# Patient Record
Sex: Female | Born: 1935 | Race: White | Hispanic: No | Smoking: Never smoker
Health system: Southern US, Community
[De-identification: ages and names within clinical notes are randomized; demographics above are authoritative.]

## PROBLEM LIST (undated history)

## (undated) DIAGNOSIS — D128 Benign neoplasm of rectum: Secondary | ICD-10-CM

## (undated) DIAGNOSIS — E039 Hypothyroidism, unspecified: Secondary | ICD-10-CM

## (undated) DIAGNOSIS — K5790 Diverticulosis of intestine, part unspecified, without perforation or abscess without bleeding: Secondary | ICD-10-CM

## (undated) DIAGNOSIS — J40 Bronchitis, not specified as acute or chronic: Secondary | ICD-10-CM

## (undated) DIAGNOSIS — H409 Unspecified glaucoma: Secondary | ICD-10-CM

## (undated) DIAGNOSIS — N189 Chronic kidney disease, unspecified: Secondary | ICD-10-CM

## (undated) DIAGNOSIS — K648 Other hemorrhoids: Secondary | ICD-10-CM

## (undated) DIAGNOSIS — E785 Hyperlipidemia, unspecified: Secondary | ICD-10-CM

## (undated) DIAGNOSIS — K219 Gastro-esophageal reflux disease without esophagitis: Secondary | ICD-10-CM

## (undated) DIAGNOSIS — I10 Essential (primary) hypertension: Secondary | ICD-10-CM

## (undated) DIAGNOSIS — E119 Type 2 diabetes mellitus without complications: Secondary | ICD-10-CM

## (undated) HISTORY — PX: APPENDECTOMY: SHX54

## (undated) HISTORY — DX: Gastro-esophageal reflux disease without esophagitis: K21.9

## (undated) HISTORY — PX: HERNIA REPAIR: SHX51

## (undated) HISTORY — PX: COLONOSCOPY: SHX174

## (undated) HISTORY — DX: Hyperlipidemia, unspecified: E78.5

## (undated) HISTORY — DX: Diverticulosis of intestine, part unspecified, without perforation or abscess without bleeding: K57.90

## (undated) HISTORY — PX: TOTAL ABDOMINAL HYSTERECTOMY W/ BILATERAL SALPINGOOPHORECTOMY: SHX83

## (undated) HISTORY — PX: CHOLECYSTECTOMY: SHX55

## (undated) HISTORY — DX: Other hemorrhoids: K64.8

## (undated) HISTORY — DX: Essential (primary) hypertension: I10

## (undated) HISTORY — DX: Hypothyroidism, unspecified: E03.9

## (undated) HISTORY — DX: Type 2 diabetes mellitus without complications: E11.9

## (undated) HISTORY — DX: Bronchitis, not specified as acute or chronic: J40

## (undated) HISTORY — DX: Chronic kidney disease, unspecified: N18.9

## (undated) HISTORY — PX: TONSILLECTOMY: SUR1361

## (undated) HISTORY — DX: Unspecified glaucoma: H40.9

## (undated) HISTORY — PX: BREAST CYST EXCISION: SHX579

## (undated) HISTORY — DX: Benign neoplasm of rectum: D12.8

## (undated) HISTORY — PX: POLYPECTOMY: SHX149

---

## 1997-09-25 ENCOUNTER — Other Ambulatory Visit: Admission: RE | Admit: 1997-09-25 | Discharge: 1997-09-25 | Payer: Self-pay | Admitting: *Deleted

## 1999-04-08 ENCOUNTER — Other Ambulatory Visit: Admission: RE | Admit: 1999-04-08 | Discharge: 1999-04-08 | Payer: Self-pay | Admitting: *Deleted

## 2000-04-01 ENCOUNTER — Encounter: Admission: RE | Admit: 2000-04-01 | Discharge: 2000-04-01 | Payer: Self-pay | Admitting: *Deleted

## 2000-04-07 ENCOUNTER — Other Ambulatory Visit: Admission: RE | Admit: 2000-04-07 | Discharge: 2000-04-07 | Payer: Self-pay | Admitting: *Deleted

## 2000-06-22 ENCOUNTER — Encounter: Admission: RE | Admit: 2000-06-22 | Discharge: 2000-06-22 | Payer: Self-pay | Admitting: *Deleted

## 2000-12-19 ENCOUNTER — Encounter: Admission: RE | Admit: 2000-12-19 | Discharge: 2000-12-19 | Payer: Self-pay | Admitting: *Deleted

## 2001-06-21 ENCOUNTER — Encounter: Admission: RE | Admit: 2001-06-21 | Discharge: 2001-06-21 | Payer: Self-pay | Admitting: *Deleted

## 2002-02-24 DIAGNOSIS — D128 Benign neoplasm of rectum: Secondary | ICD-10-CM

## 2002-02-24 HISTORY — DX: Benign neoplasm of rectum: D12.8

## 2002-03-01 ENCOUNTER — Ambulatory Visit (HOSPITAL_COMMUNITY): Admission: RE | Admit: 2002-03-01 | Discharge: 2002-03-01 | Payer: Self-pay | Admitting: *Deleted

## 2002-03-01 ENCOUNTER — Encounter (INDEPENDENT_AMBULATORY_CARE_PROVIDER_SITE_OTHER): Payer: Self-pay | Admitting: Specialist

## 2002-04-26 HISTORY — PX: OTHER SURGICAL HISTORY: SHX169

## 2002-09-27 ENCOUNTER — Encounter: Payer: Self-pay | Admitting: Family Medicine

## 2002-09-27 ENCOUNTER — Ambulatory Visit (HOSPITAL_COMMUNITY): Admission: RE | Admit: 2002-09-27 | Discharge: 2002-09-27 | Payer: Self-pay | Admitting: Family Medicine

## 2002-11-19 ENCOUNTER — Encounter: Admission: RE | Admit: 2002-11-19 | Discharge: 2002-11-19 | Payer: Self-pay | Admitting: Geriatric Medicine

## 2004-01-08 ENCOUNTER — Encounter: Admission: RE | Admit: 2004-01-08 | Discharge: 2004-01-08 | Payer: Self-pay | Admitting: Internal Medicine

## 2004-01-13 ENCOUNTER — Encounter: Admission: RE | Admit: 2004-01-13 | Discharge: 2004-01-13 | Payer: Self-pay | Admitting: Internal Medicine

## 2004-01-14 ENCOUNTER — Encounter: Admission: RE | Admit: 2004-01-14 | Discharge: 2004-01-14 | Payer: Self-pay | Admitting: Internal Medicine

## 2004-01-24 ENCOUNTER — Encounter: Admission: RE | Admit: 2004-01-24 | Discharge: 2004-01-24 | Payer: Self-pay | Admitting: Internal Medicine

## 2004-03-06 ENCOUNTER — Other Ambulatory Visit: Admission: RE | Admit: 2004-03-06 | Discharge: 2004-03-06 | Payer: Self-pay | Admitting: Gynecology

## 2004-03-24 ENCOUNTER — Encounter (INDEPENDENT_AMBULATORY_CARE_PROVIDER_SITE_OTHER): Payer: Self-pay | Admitting: Specialist

## 2004-03-24 ENCOUNTER — Inpatient Hospital Stay (HOSPITAL_COMMUNITY): Admission: RE | Admit: 2004-03-24 | Discharge: 2004-03-26 | Payer: Self-pay | Admitting: Gynecology

## 2004-08-11 ENCOUNTER — Ambulatory Visit: Payer: Self-pay | Admitting: Internal Medicine

## 2005-03-17 ENCOUNTER — Encounter: Admission: RE | Admit: 2005-03-17 | Discharge: 2005-03-17 | Payer: Self-pay | Admitting: Gynecology

## 2005-06-02 ENCOUNTER — Ambulatory Visit (HOSPITAL_COMMUNITY): Admission: RE | Admit: 2005-06-02 | Discharge: 2005-06-02 | Payer: Self-pay | Admitting: *Deleted

## 2005-08-10 ENCOUNTER — Ambulatory Visit: Payer: Self-pay | Admitting: Internal Medicine

## 2006-08-10 ENCOUNTER — Ambulatory Visit: Payer: Self-pay | Admitting: Internal Medicine

## 2007-08-09 DIAGNOSIS — J40 Bronchitis, not specified as acute or chronic: Secondary | ICD-10-CM

## 2007-08-09 DIAGNOSIS — J302 Other seasonal allergic rhinitis: Secondary | ICD-10-CM

## 2007-08-09 DIAGNOSIS — J3089 Other allergic rhinitis: Secondary | ICD-10-CM

## 2007-08-10 ENCOUNTER — Ambulatory Visit: Payer: Self-pay | Admitting: Internal Medicine

## 2008-08-07 ENCOUNTER — Ambulatory Visit: Payer: Self-pay | Admitting: Internal Medicine

## 2008-08-07 DIAGNOSIS — I10 Essential (primary) hypertension: Secondary | ICD-10-CM | POA: Insufficient documentation

## 2008-08-07 DIAGNOSIS — E119 Type 2 diabetes mellitus without complications: Secondary | ICD-10-CM

## 2008-08-07 DIAGNOSIS — E039 Hypothyroidism, unspecified: Secondary | ICD-10-CM | POA: Insufficient documentation

## 2009-08-06 ENCOUNTER — Ambulatory Visit: Payer: Self-pay | Admitting: Internal Medicine

## 2010-02-03 ENCOUNTER — Ambulatory Visit: Payer: Self-pay | Admitting: Internal Medicine

## 2010-02-03 DIAGNOSIS — R0989 Other specified symptoms and signs involving the circulatory and respiratory systems: Secondary | ICD-10-CM | POA: Insufficient documentation

## 2010-02-03 DIAGNOSIS — R0609 Other forms of dyspnea: Secondary | ICD-10-CM

## 2010-05-28 NOTE — Assessment & Plan Note (Signed)
Summary: f/u 1 yr////kp   Primary Provider/Referring Provider:  Juline Patch  CC:  Follow up visit-sinus stopped up; drainage in throat; cough-productive at times and slight yellow in color at times. .  History of Present Illness: BRONCHITIS (ICD-490) ALLERGIC RHINITIS (ICD-477.9) 08/10/07- Jade [name] returns for follow-up of her allergic rhinitis and bronchitis.  Her chest feels quite comfortable.  Mild pollen  rhinitis is being managed with Claritin.  Aug 27, 2008- Bronchitis, allergic rhinitis Nasal congestion x 2 weeks blamed on pollen. Voice is cracking. Watery rhinorhea, some sneeze without fever or sore throat. Chest not congested, but feels something at larynx or trachea which clears with throat clearing of scant clear mucus. Discussed prior experience with steroid nasal sprays.  August 06, 2009- Bronchitis, allergic rhinitis Sneeze, cough from postnasal drip, nasal congestion. This varies with rain fall and how much she is outside. Cough but not wheeze. Clear phlegm.    Current Medications (verified): 1)  Synthroid 75 Mcg  Tabs (Levothyroxine Sodium) .... Take 1 Tablet By Mouth Once A Day 2)  Triamterene-Hctz 75-50 Mg  Tabs (Triamterene-Hctz) .... Take 1 Tablet By Mouth Once A Day 3)  Nexium 40 Mg  Pack (Esomeprazole Magnesium) .... Take 1 Capsule By Mouth Once A Day 4)  Eql Fish Oil 1000 Mg  Caps (Omega-3 Fatty Acids) .... Take 1 Capsule By Mouth Once A Day 5)  Multivitamins   Tabs (Multiple Vitamin) .... Take 1 Tablet By Mouth Once A Day 6)  Vivelle-Dot 0.075 Mg/24hr  Pttw (Estradiol) .... As Directed 7)  Metformin Hcl 500 Mg  Tabs (Metformin Hcl) .... Once Daily 8)  Nitrofurantoin Monohyd Macro 100 Mg  Caps (Nitrofurantoin Monohyd Macro) .... Once Daily 9)  Betmol Eye Drops .Marland KitchenMarland Kitchen. 1 Drop A Day As Directed 10)  Adult Aspirin Low Strength 81 Mg  Tbdp (Aspirin) .... Once Daily 11)  Lipitor 10 Mg Tabs (Atorvastatin Calcium) .... Take One Tablet Daily. 12)  Toviaz 4 Mg Xr24h-Tab  (Fesoterodine Fumarate) .... Take One Tablet Daily. 13)  Coq10 50 Mg Caps (Coenzyme Q10) .... Take 1 By Mouth Once Daily 14)  Vitamin D 1000 Unit Tabs (Cholecalciferol) .... Take 1 By Mouth Once Daily  Allergies: 1)  ! Codeine 2)  ! Ultram  Past History:  Past Medical History: Last updated: 08/27/08 BRONCHITIS (ICD-490) ALLERGIC RHINITIS (ICD-477.9) Diabetes, Type 2 Hypertension Hypothyroidism glaucoma  Past Surgical History: Last updated: 27-Aug-2008 Tonsillectomy breast cysts T A H and B S O Cholecystectomy Appendectomy diverticulitis hernia repair with mesh  Family History: Last updated: Aug 27, 2008 Father- died cancer Mother- died heart disease age 67  Social History: Last updated: 08-27-2008 Patient never smoked.  widowed  Risk Factors: Smoking Status: never (08/10/2007)  Review of Systems      See HPI  The patient denies anorexia, fever, weight loss, weight gain, vision loss, decreased hearing, hoarseness, chest pain, syncope, dyspnea on exertion, peripheral edema, prolonged cough, headaches, hemoptysis, abdominal pain, and severe indigestion/heartburn.    Vital Signs:  Patient profile:   77 year old female Height:      60 inches Weight:      115.38 pounds BMI:     22.62 O2 Sat:      96 % on Room air Pulse rate:   65 / minute BP sitting:   124 / 74  (left arm) Cuff size:   regular  Vitals Entered By: Reynaldo Minium CMA (August 06, 2009 11:27 AM)  O2 Flow:  Room air  Physical Exam  Additional Exam:  General:  A/Ox3; pleasant and cooperative, NAD, medium build SKIN: no rash, lesions NODES: no lymphadenopathy HEENT: [place]/AT, EOM- WNL, Conjuctivae- clear, PERRLA, TM-WNL, Nose- clear mucus bridging, Throat- clear and wnl, hoarse, no stridor NECK: Supple w/ fair ROM, JVD- none, normal carotid impulses w/o bruits Thyroid-  CHEST: Clear to P&A. Deep bresath triggered dry cough but no wheeze HEART: RRR, no m/g/r heard ABDOMEN:  ZHY:QMVH, nl pulses, no  edema  NEURO: Grossly intact to observation      Impression & Recommendations:  Problem # 1:  ALLERGIC RHINITIS (ICD-477.9)  We discussed available treatments. i suggested she start with otc antihistamine. Also gave sample Patanase nasal spray.  Orders: Est. Patient Level III (84696)  Problem # 2:  BRONCHITIS (ICD-490)  Minimal pollen related brochitis, not asthma and without bacterial infection.  Orders: Est. Patient Level III (29528)  Medications Added to Medication List This Visit: 1)  Betmol Eye Drops  .Marland Kitchen.. 1 drop a day as directed 2)  Coq10 50 Mg Caps (Coenzyme q10) .... Take 1 by mouth once daily 3)  Vitamin D 1000 Unit Tabs (Cholecalciferol) .... Take 1 by mouth once daily  Patient Instructions: 1)  Please schedule a follow-up appointment in 6 months. 2)  Try otc antihistamine for sneeze and drainage: 3)  Allegra/ fexofenadine or Claritin/ loratadine 4)  Sample Patanase nasal spray, 1-2 puffs each nostril twice daily if needed.

## 2010-05-28 NOTE — Assessment & Plan Note (Signed)
Summary: 6 months/apc   Primary Provider/Referring Provider:  Juline Patch  CC:  6 month follow up visit-stuffy nose x 1-2 weeks and SOB with climbing stairs..  History of Present Illness: 2008-08-08- Bronchitis, allergic rhinitis Nasal congestion x 2 weeks blamed on pollen. Voice is cracking. Watery rhinorhea, some sneeze without fever or sore throat. Chest not congested, but feels something at larynx or trachea which clears with throat clearing of scant clear mucus. Discussed prior experience with steroid nasal sprays.  August 06, 2009- Bronchitis, allergic rhinitis Sneeze, cough from postnasal drip, nasal congestion. This varies with rain fall and how much she is outside. Cough but not wheeze. Clear phlegm.  February 03, 2010- 2008/08/08- Bronchitis, allergic rhinitis cc 6 month follow up visit-stuffy nose x 1-2 weeks and SOB with climbing stairs. Had flu vax.  Gradually more DOE on stairs in past year or so. Denies cough or wheeze, chest pain or palpitation. Denies blood or glands swollen. C/O maxillary and nasal dry stuffiness for 2 weeks. Dances 3 days/ week and notes a little DOE with faster dances. Denies heart condition.     Preventive Screening-Counseling & Management  Alcohol-Tobacco     Smoking Status: never  Current Medications (verified): 1)  Synthroid 75 Mcg  Tabs (Levothyroxine Sodium) .... Take 1 Tablet By Mouth Once A Day 2)  Triamterene-Hctz 75-50 Mg  Tabs (Triamterene-Hctz) .... Take 1 Tablet By Mouth Once A Day 3)  Nexium 40 Mg  Pack (Esomeprazole Magnesium) .... Take 1 Capsule By Mouth Once A Day 4)  Eql Fish Oil 1000 Mg  Caps (Omega-3 Fatty Acids) .... Take 1 Capsule By Mouth Once A Day 5)  Multivitamins   Tabs (Multiple Vitamin) .... Take 1 Tablet By Mouth Once A Day 6)  Vivelle-Dot 0.075 Mg/24hr  Pttw (Estradiol) .... As Directed 7)  Metformin Hcl 500 Mg  Tabs (Metformin Hcl) .... Once Daily 8)  Nitrofurantoin Monohyd Macro 100 Mg  Caps (Nitrofurantoin Monohyd  Macro) .... Once Daily 9)  Betmol Eye Drops .Marland KitchenMarland Kitchen. 1 Drop A Day As Directed 10)  Adult Aspirin Low Strength 81 Mg  Tbdp (Aspirin) .... Once Daily 11)  Lipitor 10 Mg Tabs (Atorvastatin Calcium) .... Take One Tablet Daily. 12)  Toviaz 4 Mg Xr24h-Tab (Fesoterodine Fumarate) .... Take One Tablet Daily. 13)  Coq10 50 Mg Caps (Coenzyme Q10) .... Take 1 By Mouth Once Daily 14)  Vitamin D 1000 Unit Tabs (Cholecalciferol) .... Take 1 By Mouth Once Daily  Allergies (verified): 1)  ! Codeine 2)  ! Ultram 3)  ! Talwin  Past History:  Past Medical History: Last updated: 2008/08/08 BRONCHITIS (ICD-490) ALLERGIC RHINITIS (ICD-477.9) Diabetes, Type 2 Hypertension Hypothyroidism glaucoma  Past Surgical History: Last updated: 08-08-08 Tonsillectomy breast cysts T A [facility] and B S [facility] Cholecystectomy Appendectomy diverticulitis hernia repair with mesh  Family History: Last updated: 08-08-2008 Father- died cancer Mother- died heart disease age 22  Social History: Last updated: August 08, 2008 Patient never smoked.  widowed  Risk Factors: Smoking Status: never (02/03/2010)  Review of Systems      See HPI       The patient complains of shortness of breath with activity and nasal congestion/difficulty breathing through nose.  The patient denies shortness of breath at rest, productive cough, non-productive cough, coughing up blood, chest pain, irregular heartbeats, acid heartburn, indigestion, loss of appetite, weight change, abdominal pain, difficulty swallowing, sore throat, tooth/dental problems, and headaches.    Vital Signs:  Patient profile:   77 year old female Height:  60 inches Weight:      111.38 pounds BMI:     21.83 O2 Sat:      97 % on Room air Pulse rate:   70 / minute BP sitting:   122 / 70  (left arm) Cuff size:   regular  Vitals Entered By: Reynaldo Minium CMA (February 03, 2010 11:10 AM)  O2 Flow:  Room air CC: 6 month follow up visit-stuffy nose x 1-2 weeks and SOB  with climbing stairs.   Physical Exam  Additional Exam:  General: A/Ox3; pleasant and cooperative, NAD, medium build SKIN: no rash, lesions NODES: no lymphadenopathy HEENT: [place]/AT, EOM- WNL, Conjuctivae- clear, PERRLA, TM-WNL, Nose- clear mucus bridging, Throat- clear and wnl, hoarse, no stridor NECK: Supple w/ fair ROM, JVD- none, normal carotid impulses w/o bruits Thyroid-  CHEST: Clear to P&A. Unlabored, no wheeze or rales HEART: RRR, no m/g/r heard ABDOMEN: trim WUJ:WJXB, nl pulses, no edema  NEURO: Grossly intact to observation      Impression & Recommendations:  Problem # 1:  BRONCHITIS (ICD-490)  Hx of chronic bronchitis, but asymptomatic except for her mild awareness of exertional dyspnea which may be normal for her age. I am delighted that she is still dancing regularly for the social and exercise benefits. I don't think she needs PFT or meds, but I will update CXR. We discussed the small possiiity that her beta blocker eye drops might contribute to dyspnea without any wheeze. She has been on them 10 years.   Problem # 2:  ALLERGIC RHINITIS (ICD-477.9) Seasonal rhinitis with nasal congestion and drainage are best managed otc for now.   Problem # 3:  DYSPNEA ON EXERTION (ICD-786.09) Assessment: Comment Only  Her updated medication list for this problem includes:    Triamterene-hctz 75-50 Mg Tabs (Triamterene-hctz) .Marland Kitchen... Take 1 tablet by mouth once a day  Other Orders: Est. Patient Level III (14782) T-2 View CXR (71020TC)  Patient Instructions: 1)  Please schedule a follow-up appointment in 1 year. 2)  A chest x-ray has been recommended.  Your imaging study may require preauthorization.    Immunization History:  Influenza Immunization History:    Influenza:  historical (01/24/2010)

## 2010-08-04 ENCOUNTER — Ambulatory Visit: Payer: Self-pay | Admitting: Internal Medicine

## 2010-08-28 ENCOUNTER — Encounter: Payer: Self-pay | Admitting: Internal Medicine

## 2010-09-01 ENCOUNTER — Ambulatory Visit (INDEPENDENT_AMBULATORY_CARE_PROVIDER_SITE_OTHER): Payer: Medicare Other | Admitting: Internal Medicine

## 2010-09-01 ENCOUNTER — Encounter: Payer: Self-pay | Admitting: Internal Medicine

## 2010-09-01 VITALS — BP 118/70 | HR 64 | Ht 59.0 in | Wt 112.6 lb

## 2010-09-01 DIAGNOSIS — J309 Allergic rhinitis, unspecified: Secondary | ICD-10-CM

## 2010-09-01 DIAGNOSIS — R0609 Other forms of dyspnea: Secondary | ICD-10-CM

## 2010-09-01 DIAGNOSIS — J40 Bronchitis, not specified as acute or chronic: Secondary | ICD-10-CM

## 2010-09-01 DIAGNOSIS — R0989 Other specified symptoms and signs involving the circulatory and respiratory systems: Secondary | ICD-10-CM

## 2010-09-01 NOTE — Assessment & Plan Note (Addendum)
She still c/o stuffy nose, but is reluctant to try meds that might interfere with her other problems. I thin she could use an occasional antihistamine or steroid nasal spray for limited trials.

## 2010-09-01 NOTE — Patient Instructions (Signed)
Suggest you try otc Claritin/ loratadine as an antihistamine/ allergy pill once daily, only when needed.

## 2010-09-01 NOTE — Progress Notes (Signed)
  Subjective:    Patient ID: Jade [name], female    DOB: [birth year], 77 y.o.   MRN: [id number]  HPI 09/01/10- 59 yoF followed for bronchitis and allergic rhinitis Last here February 03, 2010. She still goes dancing 3 days/ week, CXR Oct 11 showed stable CE but NAD.  With Spring season pollen has caused some stuffiness and drainange., but no major cough or wheeze., Hasn't tried antihistamaine. Over time she feels she is slowly more short of breath, but it hasn't prevented her dancing and there has been no sudden event.    Review of Systems Constitutional:   No weight loss, night sweats,  Fevers, chills, fatigue, lassitude. HEENT:   No headaches,  Difficulty swallowing,  Tooth/dental problems,  Sore throat,                No sneezing, itching, ear ache,  CV:  No chest pain,  Orthopnea, PND, swelling in lower extremities, anasarca, dizziness, palpitations  GI  No heartburn, indigestion, abdominal pain, nausea, vomiting, diarrhea, change in bowel habits, loss of appetite  Resp: No-  Acute shortness of breath with exertion or at rest.  No excess mucus, no productive cough,  No non-productive cough,  No coughing up of blood.  No change in color of mucus.  No wheezing.    Skin: no rash or lesions.  GU: no dysuria, change in color of urine, no urgency or frequency.  No flank pain.  MS:  No joint pain or swelling.  No decreased range of motion.  No back pain.  Psych:  No change in mood or affect. No depression or anxiety.  No memory loss.      Objective:   Physical Exam    General- Alert, Oriented, Affect-appropriate, Distress- none acute   trim  Skin- rash-none, lesions- none, excoriation- none  Lymphadenopathy- none  Head- atraumatic  Eyes- Gross vision intact, PERRLA, conjunctivae clear, secretions  Ears- Normal for age- Hearing, canals, Tm  Nose- Clear,  No -Septal dev, mucus, polyps, erosion, perforation Not obviously stuffy  Throat- Mallampati II , mucosa clear ,  drainage- none, tonsils- atrophic  Neck- flexible , trachea midline, no stridor , thyroid nl, carotid no bruit  Chest - symmetrical excursion , unlabored     Heart/CV- RRR , no murmur , no gallop  , no rub, nl s1 s2                     - JVD- none , edema- none, stasis changes- none, varices- none     Lung- clear to P&A- diminished ,           wheeze- none, cough- none , dullness-none, rub- none     Chest wall-  Abd- tender-no, distended-no, bowel sounds-present, HSM- no  Br/ Gen/ Rectal- Not done, not indicated  Extrem- cyanosis- none, clubbing, none, atrophy- none, strength- nl  Neuro- grossly intact to observation      Assessment & Plan:

## 2010-09-06 ENCOUNTER — Encounter: Payer: Self-pay | Admitting: Internal Medicine

## 2010-09-06 NOTE — Assessment & Plan Note (Signed)
Heart may be more limiting than lungs, but not in any acute process. Her continued exercise serves her well.

## 2010-09-11 NOTE — Op Note (Signed)
   NAME:  Jade [name], Jade [name]                       ACCOUNT NO.:  [id number]   MEDICAL RECORD NO.:  [id number]                   PATIENT TYPE:  AMB   LOCATION:  ENDO                                 FACILITY:  [facility]   PHYSICIAN:  Georgiana Spinner, M.D.                 DATE OF BIRTH:  1927-08-27   DATE OF PROCEDURE:  03/01/2002  DATE OF DISCHARGE:                                 OPERATIVE REPORT   PROCEDURE:  Colonoscopy.   INDICATIONS:  Colon polyp, colon cancer screening.   ANESTHESIA:  None further given.   DESCRIPTION OF PROCEDURE:  With the patient mildly sedated in the left  lateral decubitus position, the Olympus videoscopic colonoscope was inserted  in the rectum and passed under direct vision to the cecum, identified by  ileocecal valve and appendiceal orifice, both of which were photographed.  From this point the colonoscope was slowly withdrawn, taking circumferential  views of the entire colonic mucosa as we pulled back to the rectum, stopping  only first in the ascending colon near the cecum, where a small polyp was  seen and removed using hot biopsy forceps technique, setting of 20-20  blended current.  We also stopped to photograph diverticulosis of the  sigmoid colon, moderate in degree.  The patient's vital signs and pulse  oximetry remained stable.  The patient tolerated the procedure well without  apparent complications.   FINDINGS:  1. Internal hemorrhoids.  2. Diverticulosis of sigmoid colon.  3. Small polyp of ascending colon near the cecum.   PLAN:  Await biopsy report.  The patient will call me for results and follow  up with me as an outpatient.                                               Georgiana Spinner, M.D.    GMO/MEDQ  D:  03/01/2002  T:  03/01/2002  Job:  [id number]   cc:   Lacretia Leigh. Quintella Reichert, M.D.

## 2010-09-11 NOTE — Op Note (Signed)
NAME:  Jade [name], Jade [name] NO.:  [id number]   MEDICAL RECORD NO.:  [id number]          PATIENT TYPE:  AMB   LOCATION:  ENDO                         [facility]ACILITY:  [facility]   PHYSICIAN:  Georgiana Spinner, M.D.    DATE OF BIRTH:  06-Nov-1927   DATE OF PROCEDURE:  06/02/2005  DATE OF DISCHARGE:                                 OPERATIVE REPORT   PROCEDURE:  Colonoscopy.   INDICATIONS:  Colon polyps.   ANESTHESIA:  Demerol 50, Versed 5 mg.   DESCRIPTION OF PROCEDURE:  With the patient mildly sedated in the left  lateral decubitus position, the Olympus videoscopic pediatric colonoscope  was inserted in the rectum and passed under direct vision through a very  tight tortuous diverticular filled sigmoid colon which may have been tented  by adhesions. We were able to get past the tight turn and then subsequently  advanced the endoscope. With pressure applied, we reached the cecum  identified by ileocecal valve and appendiceal orifice both of which were  photographed. From this point, the colonoscope was slowly withdrawn taking  circumferential views of the colonic mucosa stopping only in the rectum  which appeared normal on direct and showed hemorrhoids on retroflexed view.  The endoscope was straightened and withdrawn. The patient's vital signs and  pulse oximeter remained stable. The patient tolerated the procedure well  without apparent complications.   FINDINGS:  Internal hemorrhoids otherwise an unremarkable colonoscopic  examination to the cecum.   PLAN:  Consider repeat examination in 5-10 years. Given the difficulty of  this procedure would probably tend to go towards the 10-year period and then  at that point would have to the give consideration for the risk versus  benefits.           ______________________________  Georgiana Spinner, M.D.     GMO/MEDQ  D:  06/02/2005  T:  06/02/2005  Job:  [id number]

## 2010-09-11 NOTE — Op Note (Signed)
   NAME:  Jade [name], Jade [name]                       ACCOUNT NO.:  [id number]   MEDICAL RECORD NO.:  [id number]                   PATIENT TYPE:  AMB   LOCATION:  ENDO                                 FACILITY:  [facility]   PHYSICIAN:  Georgiana Spinner, M.D.                 DATE OF BIRTH:  [birth year]   DATE OF PROCEDURE:  03/01/2002  DATE OF DISCHARGE:                                 OPERATIVE REPORT   PROCEDURE:  Upper endoscopy.   ANESTHESIA:  Demerol 50 mg, Versed 5 mg.   INDICATIONS:  GERD.   DESCRIPTION OF PROCEDURE:  With the patient mildly sedated in the left  lateral decubitus position, the Olympus videoscopic endoscope was inserted  in the mouth, passed under direct vision through the esophagus, which  appeared normal, into the stomach.  Fundus, body appeared normal.  Antrum  showed changes of erythema, which were photographed and biopsied, consistent  with a gastritis.  The duodenal bulb, second portion of the duodenum  appeared normal.  From this point the endoscope was slowly withdrawn, taking  circumferential views of the entire duodenal mucosa until the endoscope had  been pulled back into the stomach, placed in retroflexion to view the  stomach from below.  The endoscope was then straightened and withdrawn,  taking circumferential views of the remaining gastric and esophageal mucosa.  The patient's vital signs and pulse oximetry remained stable.  The patient  tolerated the procedure well without apparent complications.   FINDINGS:  Mild redness of antrum, probably gastritis.   PLAN:  Await biopsy report.  The patient will call me for results and follow  up with me as an outpatient.  Proceed to colonoscopy as planned.                                               Georgiana Spinner, M.D.    GMO/MEDQ  D:  03/01/2002  T:  03/01/2002  Job:  [id number]   cc:   Lacretia Leigh. Quintella Reichert, M.D.

## 2010-09-11 NOTE — Assessment & Plan Note (Signed)
[facility]                             PULMONARY OFFICE NOTE   Jade [name], Jade [name]                    MRN:          [id number]  DATE:08/10/2006                            DOB:          [birth year]    PROBLEMS:  1. Allergic rhinitis.  2. Bronchitis.   HISTORY:  One year followup, doing very well recently with no particular  problems this winter.  Pollen is causing some mild nasal congestion, and  throat has felt a little raw coughing.  She has been out of the  decongestant and cough syrup that she had used.  She has not been  needing a metered inhaler, over-the-counter medications.   MEDICATIONS:  1. Synthroid 0.07.  2. Triamterine/HCTZ 75/50.  3. Nexium 40 mg.  4. Fish oil.  5. Multivitamins.  6. [place] 20 mg.  7. Nitrofurazone 100 mg.  8. Vivelle patch 0.075.  9. Betimol drops.  10.CoQ-10.  11.Wellbid-D 1200 mg/40, 1-2 daily p.r.n.  12.Tannate 12 suspension for cough.   OBJECTIVE:  Weight 119 pounds, BP 120/60, pulse regular and 74.  Room  air saturation 96%.  Breath sounds sound a little raspy in the bases,  and the cough was active when I examined her throat with no postnasal  drainage, wheeze, or rhonchi.  Mild turbinate edema.  No postnasal drip.  Heart sounds regular without murmur.   IMPRESSION:  Allergic rhinitis and mild chronic bronchitis or asthmatic  bronchitis.  She is actually fairly well controlled and has not needed  an inhaler.  I would watch this.   PLAN:  Refill Wellbid-D 1-2 daily p.r.n. decongestant and Tannate cough  syrup 5 mL q.6h. p.r.n. occasional use for cough as discussed.  Scheduled to return 1 year, earlier p.r.n.     Clinton D. Maple Hudson, MD, Tonny Bollman, FACP  Electronically Signed   CDY/MedQ  DD: 08/10/2006  DT: 08/10/2006  Job #: [id number]   cc:   Juline Patch, M.D.

## 2010-09-11 NOTE — Discharge Summary (Signed)
NAME:  Jade [name], Jade [name] NO.:  [id number]   MEDICAL RECORD NO.:  [id number]          PATIENT TYPE:  INP   LOCATION:  0456                         FACILITY:  [facility]   PHYSICIAN:  Gretta Cool, M.D. DATE OF BIRTH:  [birth year]   DATE OF ADMISSION:  03/24/2004  DATE OF DISCHARGE:  03/26/2004                                 DISCHARGE SUMMARY   HISTORY OF PRESENT ILLNESS:  Jade [name] is a 77 year old female, gravida 2,  para 2, with a history of hysterectomy in 1978 for pelvic support problems.  At that time, she had vaginal hysterectomy, anterior and posterior  colporrhaphy and Marshall-Marchetti cystourethropexy.  She has done well  since that procedure and has not been seen by [place] in many years.  She has had  a recent episode of diverticulitis and was treated by her medical doctor,  Jade [name].  She subsequently had a CT and a MRI of the pelvis which  revealed a multi-septated left adnexal lesion suspicious for ovarian  malignancy.  The lesion has been stable over time.  The ultrasound  examination shows both solid and cystic-looking areas with some compartment  seeming to communicate, suggesting this may be a hydrosalpinx.  The CA125  measured 7.7.  She continues to have tenderness in that area.  On exam, it  appears that the mass is adherent to the apex of the vaginal cuff and tender  to examine.  It also is questioned whether it is affixed to the colon on the  rectovaginal exam.  She is now admitted for definitive therapy with bowel  prep preoperatively, with Dr. Jonny Ruiz T. Soper standing by for the possibility  this is a ovarian cancer.  She is now admitted for exploratory laparotomy  and definitive therapy.   PHYSICAL EXAM:  GENERAL:  On physical exam, well-developed, well-nourished  white female.  CHEST:  Clear to A&P.  HEART:  Rate and rhythm without murmur or cardiac enlargement.  ABDOMEN:  Abdomen soft and scaphoid, without masses or organomegaly.  PELVIC:  External genitalia within normal limits for a female.  Vagina is  atrophic.  The cervix and uterus are surgically absent.  There is a mass  that fills the apex of the cuff, deviating to the left, appears to be  attached to the rectosigmoid colon as well, relatively immobile and tender.  The right adnexa could not be palpated.  Rectovaginal exam confirms.   IMPRESSION:  1.  Complex cyst and solid adnexal mass on the left, most likely      hydrosalpinx, tubo-ovarian complex, the result of recent diverticulitis      with extension of the pelvic inflammatory process, rule out ovarian      anaplastic lesion.  2.  History of hysterectomy and multiple repairs.  3.  Urge incontinence.  4.  Hypothyroidism, on replacement.  5.  History of diverticulitis and gastroesophageal reflux disease.   She is now admitted for definitive therapy with bowel preparation.  Risks  and benefits have been discussed with the patient and she accepts these  procedures.   LABORATORY DATA:  On admission, hemoglobin  12.9, hematocrit 37.6; on the  first postoperative day, hemoglobin was 10.4, hematocrit 30.7.  Her  preoperative lab work was within normal limits.  Blood type is A-negative  with a negative antibody screen.   EKG:  Nonspecific ST abnormality noted.   Chest x-ray:  No acute cardiopulmonary process.   HOSPITAL COURSE:  The patient underwent exploratory laparotomy with left  salpingo-oophorectomy under general anesthesia.  The procedure was completed  without any complications and the patient was returned to the recovery room  in excellent condition.  Pathology report:  Left ovary and fallopian tube --  cystadenofibroma with benign fallopian tube, no endometriosis or malignancy  was identified.  Her postoperative course was without complications and she  was discharged on the first postoperative day in excellent condition.   FINAL DISCHARGE INSTRUCTIONS:  Final discharge instructions included  no  heavy lifting or straining, no vaginal entrance, and to increase ambulation  as tolerated.  She is to call for fever of over 100.5 or failure of daily  improvement.   DIET:  Regular.   MEDICATIONS:  She was told to return to preoperative medications and Tylox 1  p.o. q.4 h. p.r.n. discomfort.   FOLLOWUP:  She is to return to the office in 1 week for followup.   CONDITION ON DISCHARGE:  Excellent.   FINAL DISCHARGE DIAGNOSIS:  Left adnexal mass -- benign serous  cystadenofibroma.   PROCEDURES PERFORMED:  Exploratory laparotomy and left salpingo-oophorectomy  under general anesthesia.      EMK/MEDQ  D:  04/29/2004  T:  04/29/2004  Job:  [id number]   cc:   Juline [name], M.D.  [address]  [place], [place] [address]  Fax: [phone]

## 2010-09-11 NOTE — H&P (Signed)
NAME:  GREG, ECKRICH NO.:  [id number]   MEDICAL RECORD NO.:  [id number]          PATIENT TYPE:  INP   LOCATION:  NA                           [facility]ACILITY:  [facility]   PHYSICIAN:  Gretta Cool, M.D. DATE OF BIRTH:  11-16-1927   DATE OF ADMISSION:  03/24/2004  DATE OF DISCHARGE:                                HISTORY & PHYSICAL   Ms. Rowzee is a 77 year old G2, P2 with a history of hysterectomy in 1978 by  Dr. Nicholas Lose for pelvic support problems. She had a vaginal hysterectomy,  anterior and posterior colporrhaphy and Marshall-Marchetti cystourethropexy.  She has done well subsequently and has not been seen in our office for many  years.  She had a recent episode of diverticulitis and was treated  conservatively with medical therapy by Dr. Juline Patch. She subsequently  had CT and MRI of the pelvis which revealed a multi-septated left adnexal  lesion suspicious for ovarian malignancy.  The lesion has been stable over  time. On ultrasound examination it has both solid and cystic looking areas  but some compartments seem to communicate suggesting this may represent a  hydrosalpinx. Her CA-125 is measured at 7.7.  She continues to have  discomfort and tenderness in the area. She has a mass that is adherent to  the apex of the vaginal cuff, moderately tender to examination.  It also  appears fixed to the colon on rectovaginal exam.  She is now admitted for  definitive therapy with bowel prep preoperatively with Dr. Ronita Hipps  standing by for the possibility that this could represent an ovarian cancer.  She is now admitted for exploratory laparotomy and definitive therapy.   PAST MEDICAL HISTORY:  1.  Usual childhood disease without sequela medical illnesses.  2.  History of a cholecystectomy for acute cholecystitis in 1963.  3.  History of reflux and GERD.  4.  Recent history of diverticulitis.  5.  The patient is hypothyroid on replacement.  6.  The patient has blood  pressure elevation in previous hospitalizations.  7.  Tonsillectomy and adenoidectomy in 1960.  8.  Nose and sinus surgery in 1974.  9.  Hysterectomy in 1978.  10. Breast biopsies in 1979 and 1984.   ALLERGIES:  Intolerance to CODEINE. No other known drug allergies.   CURRENT MEDICATIONS:  1.  Synthroid 0.05 mg daily.  2.  Nexium 40 mg daily.  3.  Detrol LA 4 mg daily.  4.  Estrogen patch 0.0375.  5.  Multiple over-the-counter nutritional supplements.   FAMILY HISTORY:  Father died of lung cancer and esophageal cancer. Mother  died of heart disease at 66.  One brother deceased of heart disease. Two brothers living with heart  disease. One brother living and well. One sister living and well.   REVIEW OF SYSTEMS:  HEENT:  Denies symptoms.  PULMONARY:  Denies asthma,  cough, bronchitis, shortness of breath.  GI/GU: Denies frequency, urgency,  dysuria, change in bowel habits, food intolerance.   PHYSICAL EXAMINATION:  GENERAL:  Well-developed, well-nourished white  female.  HEENT:  Pupils are equal, round and reactive to  light and accommodation.  Fundi not examined. Oropharynx clear.  NECK:  Supple without mass or thyroid enlargement.  CHEST:  Clear to A&P.  BREASTS:  Without mass, denies any nipple discharge.  HEART:  Regular rhythm without murmur or cardiac enlargement.  ABDOMEN:  Soft, scaphoid without mass or organomegaly.  PELVIC:  External genitalia normal female. Vagina clean but atrophic. The  cervix and uterus are surgically absent. There is a mass that fills the apex  of the cuff, deviates to the left, appears attached to the rectosigmoid  colon as well. Relatively immobile and tender. Right adnexa cannot be  palpated. Rectovaginal exam confirms.  EXTREMITIES:  Negative.  NEUROLOGIC EXAM:  Physiologic.   IMPRESSION:  1.  Complex cystic and solid adnexal mass, left, most likely hydrosalpinx      tubo-ovarian complex the result of recent diverticulitis with extension       of the pelvic inflammatory process.  Rule out ovarian anaplastic lesion.  2.  History of hysterectomy and multiple repairs.  3.  Urge incontinence.  4.  Hypothyroid on replacement.  5.  History of diverticulitis and gastroesophageal reflux disease.      CWL/MEDQ  D:  03/24/2004  T:  03/24/2004  Job:  [id number]   cc:   Juline Patch, M.D.  [address]  [place], [place] [address]  Fax: [phone]

## 2010-09-11 NOTE — Op Note (Signed)
NAME:  Jade [name], Jade [name] NO.:  [id number]   MEDICAL RECORD NO.:  [id number]          PATIENT TYPE:  INP   LOCATION:  NA                           FACILITY:  [facility]   PHYSICIAN:  Gretta Cool, M.D. DATE OF BIRTH:  Aug 07, 1927   DATE OF PROCEDURE:  DATE OF DISCHARGE:                                 OPERATIVE REPORT   PREOPERATIVE DIAGNOSIS:  Left adnexal mass.   POSTOPERATIVE DIAGNOSIS:  Benign serous cyst adenofibroma.   SURGEON:  Dr. Beather Arbour   ASSISTANT:  Dr. Phyllis Ginger   ANESTHESIA:  General orotracheal.   DESCRIPTION OF PROCEDURE:  Under excellent general anesthesia with the  patient prepped and draped in lithotomy position, a vertical skin incision  was made from the umbilicus to the pubis symphysis.  The incision was then  extended through the fascia.  The peritoneum was then opened and the abdomen  explored.  There were no abnormalities in the upper abdomen.  The  examination in the pelvis revealed extensive adhesion of the rectosigmoid to  the left adnexal mass.  The adnexal mass appeared to be a complex of ovary  and tube and inflammatory tissue.  Examination of the node-bearing areas  revealed no evidence of adenopathy.  There was no evidence of extant disease  on the liver, diaphragm surfaces, or other peritoneal surfaces.  The adnexal  structures were then grasped at the round ligament with Allis clamps.  The  ligament was then sutured and transected.  The posterior leaf of the broad  was the opened.  The ureter was then dissected free and visualized.  At this  point, the infundibulopelvic vessels were then skeletonized, clamped, cut,  sutured and tied with 0 Vicryl.  The dense bowel adhesions at the site of  recent diverticulitis were then lysed by blunt and sharp dissection.  There  was no injury to the bowel, and the transection was clean.  The entire ovary  was removed.  Once the tissue was mobilized, the cautery was used to  transect the  adventitial tissue and the adhesions to the bladder surface and  to the pelvic floor.  The specimen was then submitted to the pathologist for  frozen section.  Frozen section was returned with a diagnosis of benign  cystadenofibroma.  At this point, careful examination of the right ovary and  pelvic floor was undertaken.  The right ovary had been previously removed,  and the tube remained.  The pelvic floor was reperitonealized with running  suture of #4-0 PDS.  The pelvis was then irrigated with lactated Ringer's to  remove all debris.  All the surfaces left were smooth and clean.  The packs  and retractors are then removed.  The abdominal peritoneum was closed with a  running suture of 2-0 Monocryl.  The fascia was then approximated with a  running suture of 0 Vicryl as a running mattress closure from anterior  incision to midline and from the symphysis pubis to the midline.  At this  point, the  subcutaneous tissue was reapproximated with interrupted sutures of 3-0  Vicryl and the skin closed  with skin staples and Steri-Strips.  At the end  of the procedure, sponge and lap counts were correct.  No complications.  The patient returned to recovery room in excellent condition.      CWL/MEDQ  D:  03/24/2004  T:  03/24/2004  Job:  [id number]   cc:   Juline Patch, M.D.  [address]  [place], [place] [address]  Fax: [phone]

## 2010-11-16 ENCOUNTER — Encounter: Payer: Self-pay | Admitting: Gastroenterology

## 2010-11-17 ENCOUNTER — Other Ambulatory Visit: Payer: Self-pay | Admitting: Internal Medicine

## 2010-11-17 ENCOUNTER — Ambulatory Visit
Admission: RE | Admit: 2010-11-17 | Discharge: 2010-11-17 | Disposition: A | Discharge status: Home / Self Care | Payer: Medicare Other | Source: Ambulatory Visit | Attending: Internal Medicine | Admitting: Internal Medicine

## 2010-11-17 DIAGNOSIS — R1032 Left lower quadrant pain: Secondary | ICD-10-CM

## 2010-11-17 DIAGNOSIS — K5792 Diverticulitis of intestine, part unspecified, without perforation or abscess without bleeding: Secondary | ICD-10-CM

## 2010-11-17 MED ORDER — IOHEXOL 300 MG/ML  SOLN: 100.0000 mL | Freq: Once | INTRAMUSCULAR | Status: AC | PRN

## 2010-11-25 DIAGNOSIS — K5790 Diverticulosis of intestine, part unspecified, without perforation or abscess without bleeding: Secondary | ICD-10-CM

## 2010-11-25 HISTORY — DX: Diverticulosis of intestine, part unspecified, without perforation or abscess without bleeding: K57.90

## 2010-12-10 ENCOUNTER — Ambulatory Visit (INDEPENDENT_AMBULATORY_CARE_PROVIDER_SITE_OTHER): Payer: Medicare Other | Admitting: Gastroenterology

## 2010-12-10 ENCOUNTER — Encounter: Payer: Self-pay | Admitting: Gastroenterology

## 2010-12-10 VITALS — BP 160/74 | HR 72 | Ht <= 58 in | Wt 113.2 lb

## 2010-12-10 DIAGNOSIS — R1032 Left lower quadrant pain: Secondary | ICD-10-CM

## 2010-12-10 DIAGNOSIS — Z86010 Personal history of colonic polyps: Secondary | ICD-10-CM

## 2010-12-10 NOTE — Patient Instructions (Addendum)
You have been scheduled for a Colonoscopy.  See separate sheet. Take your Mobic every day for a week and then go back to as needed. SuPrep sample given.  cc: Juline Patch, MD

## 2010-12-10 NOTE — Progress Notes (Signed)
History of Present Illness: This is an 77 year old female with left lower quadrant and left groin pain for one month. Her symptoms occur intermittently and are not necessarily associated with any digestive function or activity. They tend to last for 2-3 minutes at a time and then resolve. They may come back several times in the course of the day. She feels that moving around and walking may help to alleviate the symptoms. She underwent a CT scan of the abdomen and pelvis that showed left colon diverticulosis and no other abnormalities. She has a history of adenomatous colon polyps initially found in 2003 by Dr. Virginia Rochester. Followup colonoscopy in 2007 showed no recurrent polyps. She denies change in bowel habits, melena, hematochezia, constipation, diarrhea.   Past Medical History  Diagnosis Date  . Bronchitis   . Allergic rhinitis   . Type 2 diabetes mellitus   . Hypertension   . Hypothyroid   . Glaucoma   . Internal hemorrhoids   . Diverticulosis   . Tubular adenoma polyp of rectum 02/2002  . GERD (gastroesophageal reflux disease)   . Hyperlipidemia    Past Surgical History  Procedure Date  . Tonsillectomy   . Breast cyst excision   . Total abdominal hysterectomy w/ bilateral salpingoophorectomy   . Cholecystectomy   . Appendectomy   . Hernia repair     w/ mesh    reports that she has never smoked. She does not have any smokeless tobacco history on file. She reports that she does not drink alcohol or use illicit drugs. family history includes Diabetes in her brother, maternal aunt, and maternal uncle; Heart disease in her brother and mother; and Lung cancer in her father.  There is no history of Colon cancer. Allergies  Allergen Reactions  . Codeine   . Pentazocine Lactate   . Tramadol Hcl    Outpatient Encounter Prescriptions as of 12/10/2010  Medication Sig Dispense Refill  . aspirin 81 MG tablet Take 81 mg by mouth daily.        . cefdinir (OMNICEF) 300 MG capsule Take 300 mg by  mouth 2 (two) times daily.        . Cholecalciferol (VITAMIN D) 1000 UNITS capsule Take 1,000 Units by mouth daily.        . Coenzyme Q10 (COQ10) 50 MG CAPS Take 1 capsule by mouth daily.        Marland Kitchen esomeprazole (NEXIUM) 40 MG capsule Take 40 mg by mouth daily.        Marland Kitchen estradiol (VIVELLE-DOT) 0.075 MG/24HR as directed.        . fesoterodine (TOVIAZ) 4 MG TB24 Take 4 mg by mouth daily.        . fish oil-omega-3 fatty acids 1000 MG capsule Take 1 capsule by mouth daily.        Marland Kitchen levothyroxine (SYNTHROID, LEVOTHROID) 75 MCG tablet Take 75 mcg by mouth daily.        Marland Kitchen lisinopril (PRINIVIL,ZESTRIL) 20 MG tablet Take 20 mg by mouth daily.        Marland Kitchen losartan (COZAAR) 50 MG tablet Take 50 mg by mouth daily.        . meloxicam (MOBIC) 15 MG tablet Take 15 mg by mouth daily as needed.        . metFORMIN (GLUCOPHAGE) 500 MG tablet Take 500 mg by mouth daily.        . Multiple Vitamin (MULTIVITAMIN) capsule Take 1 capsule by mouth daily.        Marland Kitchen  nitrofurantoin (MACRODANTIN) 100 MG capsule Take 100 mg by mouth daily.        . NON FORMULARY Betmol Eye drops.   1 drop a day as directed       . triamterene-hydrochlorothiazide (MAXZIDE) 75-50 MG per tablet Take 1 tablet by mouth daily.        Marland Kitchen DISCONTD: atorvastatin (LIPITOR) 10 MG tablet Take 10 mg by mouth daily.         Review of Systems: Notes allergies, hearing problems, speech swelling and urinary leakage. Pertinent positive and negative review of systems were noted in the above HPI section. All other review of systems were otherwise negative.  Physical Exam: General: Well developed , well nourished, no acute distress Head: Normocephalic and atraumatic Eyes:  sclerae anicteric, EOMI Ears: Normal auditory acuity Mouth: No deformity or lesions Neck: Supple, no masses or thyromegaly Lungs: Clear throughout to auscultation Heart: Regular rate and rhythm; no murmurs, rubs or bruits Abdomen: Soft, non tender and non distended. No masses,  hepatosplenomegaly or hernias noted. Normal Bowel sounds Rectal: Deferred to colonoscopy Musculoskeletal: Symmetrical with no gross deformities  Skin: No lesions on visible extremities Pulses:  Normal pulses noted Extremities: No clubbing, cyanosis, edema or deformities noted Neurological: Alert oriented x 4, grossly nonfocal Cervical Nodes:  No significant cervical adenopathy Inguinal Nodes: No significant inguinal adenopathy Psychological:  Alert and cooperative. Normal mood and affect  Assessment and Recommendations:  1. Left lower quadrant and left groin pain. Personal history of adenomatous colon polyps diagnosed in November 2003. Symptoms appear to be musculoskeletal in etiology. She is advised to take Mobic on a daily basis for one week and then she may use it when necessary. Further evaluation with colonoscopy. Rule out colorectal neoplasms. The risks, benefits, and alternatives to colonoscopy with possible biopsy and possible polypectomy were discussed with the patient and they consent to proceed.   2. GERD. Symptoms well-controlled on Nexium 40 mg every morning.

## 2010-12-24 ENCOUNTER — Encounter: Payer: Self-pay | Admitting: Gastroenterology

## 2010-12-24 ENCOUNTER — Ambulatory Visit (AMBULATORY_SURGERY_CENTER): Payer: Medicare Other | Admitting: Gastroenterology

## 2010-12-24 DIAGNOSIS — D126 Benign neoplasm of colon, unspecified: Secondary | ICD-10-CM

## 2010-12-24 DIAGNOSIS — Z86010 Personal history of colonic polyps: Secondary | ICD-10-CM

## 2010-12-24 DIAGNOSIS — Z1211 Encounter for screening for malignant neoplasm of colon: Secondary | ICD-10-CM

## 2010-12-24 DIAGNOSIS — R1032 Left lower quadrant pain: Secondary | ICD-10-CM

## 2010-12-24 LAB — GLUCOSE, CAPILLARY: Glucose-Capillary: 108 mg/dL — ABNORMAL HIGH (ref 70–99)

## 2010-12-24 MED ORDER — SODIUM CHLORIDE 0.9 % IV SOLN: 500.0000 mL | INTRAVENOUS | Status: DC

## 2010-12-24 NOTE — Patient Instructions (Addendum)
FOLLOW DISCHARGE INSTRUCTIONS (BLUE & GREEN SHEETS)   INFORMATION GIVEN ON POLYPS, DIVERTICULOSIS, HEMORRHOIDS, & HIGH FIBER DIET RECOMMENDED.

## 2010-12-25 ENCOUNTER — Telehealth: Payer: Self-pay | Admitting: *Deleted

## 2010-12-25 NOTE — Telephone Encounter (Signed)
No answer. No identifier. No message left./TE

## 2010-12-29 ENCOUNTER — Ambulatory Visit: Payer: Medicare Other | Admitting: Gastroenterology

## 2011-01-04 ENCOUNTER — Encounter: Payer: Self-pay | Admitting: Gastroenterology

## 2011-02-08 DIAGNOSIS — N3281 Overactive bladder: Secondary | ICD-10-CM | POA: Insufficient documentation

## 2011-02-08 DIAGNOSIS — K219 Gastro-esophageal reflux disease without esophagitis: Secondary | ICD-10-CM | POA: Insufficient documentation

## 2011-02-08 DIAGNOSIS — E785 Hyperlipidemia, unspecified: Secondary | ICD-10-CM | POA: Insufficient documentation

## 2011-02-08 DIAGNOSIS — H409 Unspecified glaucoma: Secondary | ICD-10-CM | POA: Insufficient documentation

## 2011-03-26 DIAGNOSIS — H02839 Dermatochalasis of unspecified eye, unspecified eyelid: Secondary | ICD-10-CM | POA: Insufficient documentation

## 2011-04-14 ENCOUNTER — Other Ambulatory Visit: Payer: Self-pay | Admitting: Internal Medicine

## 2011-04-14 DIAGNOSIS — N281 Cyst of kidney, acquired: Secondary | ICD-10-CM

## 2011-04-29 ENCOUNTER — Ambulatory Visit
Admission: RE | Admit: 2011-04-29 | Discharge: 2011-04-29 | Disposition: A | Discharge status: Home / Self Care | Payer: Medicare Other | Source: Ambulatory Visit | Attending: Internal Medicine | Admitting: Internal Medicine

## 2011-04-29 DIAGNOSIS — N281 Cyst of kidney, acquired: Secondary | ICD-10-CM | POA: Diagnosis not present

## 2011-05-07 DIAGNOSIS — H409 Unspecified glaucoma: Secondary | ICD-10-CM | POA: Diagnosis not present

## 2011-05-07 DIAGNOSIS — Z961 Presence of intraocular lens: Secondary | ICD-10-CM | POA: Diagnosis not present

## 2011-08-31 ENCOUNTER — Ambulatory Visit (INDEPENDENT_AMBULATORY_CARE_PROVIDER_SITE_OTHER): Payer: Medicare Other | Admitting: Internal Medicine

## 2011-08-31 ENCOUNTER — Encounter: Payer: Self-pay | Admitting: Internal Medicine

## 2011-08-31 ENCOUNTER — Ambulatory Visit (INDEPENDENT_AMBULATORY_CARE_PROVIDER_SITE_OTHER)
Admission: RE | Admit: 2011-08-31 | Discharge: 2011-08-31 | Disposition: A | Discharge status: Home / Self Care | Payer: Medicare Other | Source: Ambulatory Visit | Attending: Internal Medicine | Admitting: Internal Medicine

## 2011-08-31 VITALS — BP 120/68 | HR 66 | Ht 59.0 in | Wt 111.0 lb

## 2011-08-31 DIAGNOSIS — R0989 Other specified symptoms and signs involving the circulatory and respiratory systems: Secondary | ICD-10-CM

## 2011-08-31 DIAGNOSIS — J309 Allergic rhinitis, unspecified: Secondary | ICD-10-CM

## 2011-08-31 DIAGNOSIS — R0609 Other forms of dyspnea: Secondary | ICD-10-CM | POA: Diagnosis not present

## 2011-08-31 DIAGNOSIS — J302 Other seasonal allergic rhinitis: Secondary | ICD-10-CM

## 2011-08-31 NOTE — Progress Notes (Signed)
  Subjective:    Patient ID: Jade [name], female    DOB: 03-20-1928, 78 y.o.   MRN: [id number]  HPI 09/01/10- 56 yoF followed for bronchitis and allergic rhinitis Last here February 03, 2010. She still goes dancing 3 days/ week, CXR Oct 11 showed stable CE but NAD.  With Spring season pollen has caused some stuffiness and drainange., but no major cough or wheeze., Hasn't tried antihistamaine. Over time she feels she is slowly more short of breath, but it hasn't prevented her dancing and there has been no sudden event.   08/31/11- 83 yoF followed for bronchitis and allergic rhinitis Drainage in back of throat at night, sneezing(yellow in color), nasal congestion; Denies any wheezing or SOB at this time. Post nasal drip does not burn at throat is not sore. She continues to dance regularly which is great exercise for her. Admits occasional minor "hack" but not really aware of shortness of breath.  ROS-see HPI Constitutional:   No-   weight loss, night sweats, fevers, chills, fatigue, lassitude. HEENT:   No-  headaches, difficulty swallowing, tooth/dental problems, sore throat,       + sneezing, itching, ear ache, nasal congestion, post nasal drip,  CV:  No-   chest pain, orthopnea, PND, swelling in lower extremities, anasarca, dizziness, palpitations Resp: No-   shortness of breath with exertion or at rest.              No-   productive cough,  + non-productive cough,  No- coughing up of blood.              +  change in color of mucus.  No- wheezing.   Skin: No-   rash or lesions. GI:  No-   heartburn, indigestion, abdominal pain, nausea, vomiting,  GU: . MS:  No-   joint pain or swelling.   Neuro-     nothing unusual Psych:  No- change in mood or affect. No depression or anxiety.  No memory loss.   OBJ- Physical Exam General- Alert, Oriented, Affect-appropriate, Distress- none acute Skin- rash-none, lesions- none, excoriation- none Lymphadenopathy- none Head- atraumatic  Eyes- Gross vision intact, PERRLA, conjunctivae and secretions clear            Ears- Hearing, canals-normal            Nose- + turbinate edema, no-Septal dev, mucus, polyps, erosion, perforation             Throat- Mallampati III , mucosa clear , drainage- none, tonsils- atrophic Neck- flexible , trachea midline, no stridor , thyroid nl, carotid no bruit Chest - symmetrical excursion , unlabored           Heart/CV- RRR , no murmur , no gallop  , no rub, nl s1 s2                           - JVD- none , edema- none, stasis changes- none, varices- none           Lung- +diffuse crackles, wheeze- none, cough- none , dullness-none, rub- none           Chest wall-  Abd-  Br/ Gen/ Rectal- Not done, not indicated Extrem- cyanosis- none, clubbing, none, atrophy- none, strength- nl Neuro- grossly intact to observation

## 2011-08-31 NOTE — Patient Instructions (Signed)
Order- CXR   Dx Dyspnea on exertion  Suggest otc antihistamine Claritin/ loratadine for allergic nose- drainage, mucus   1 daily as needed

## 2011-09-01 DIAGNOSIS — E7800 Pure hypercholesterolemia, unspecified: Secondary | ICD-10-CM | POA: Diagnosis not present

## 2011-09-01 DIAGNOSIS — E119 Type 2 diabetes mellitus without complications: Secondary | ICD-10-CM | POA: Diagnosis not present

## 2011-09-03 NOTE — Progress Notes (Signed)
Quick Note:  LMTCB ______ 

## 2011-09-05 NOTE — Assessment & Plan Note (Signed)
Suggest initial trial OTC antihistamine like loratadine

## 2011-09-05 NOTE — Assessment & Plan Note (Signed)
Complaint of dyspnea is currently denied and she continues dancing on a regular basis. Exam today is significant for more diffuse crackle but I would expect. I don't find signs of right-sided pulmonary edema and she is not wheezing. Plan-chest x-ray

## 2011-09-06 ENCOUNTER — Telehealth: Payer: Self-pay | Admitting: Internal Medicine

## 2011-09-06 NOTE — Telephone Encounter (Signed)
Pt aware of results 

## 2011-09-06 NOTE — Telephone Encounter (Signed)
Returning call can be reached at [phone].Jade [name]

## 2011-09-06 NOTE — Telephone Encounter (Signed)
LMTCB.   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 No active heart or lung disease---CXR results.

## 2011-09-06 NOTE — Progress Notes (Signed)
Quick Note:  Pt aware of results. ______ 

## 2011-09-07 DIAGNOSIS — E7800 Pure hypercholesterolemia, unspecified: Secondary | ICD-10-CM | POA: Diagnosis not present

## 2011-09-07 DIAGNOSIS — I10 Essential (primary) hypertension: Secondary | ICD-10-CM | POA: Diagnosis not present

## 2011-09-07 DIAGNOSIS — E119 Type 2 diabetes mellitus without complications: Secondary | ICD-10-CM | POA: Diagnosis not present

## 2011-09-15 DIAGNOSIS — H4011X0 Primary open-angle glaucoma, stage unspecified: Secondary | ICD-10-CM | POA: Diagnosis not present

## 2011-09-15 DIAGNOSIS — H409 Unspecified glaucoma: Secondary | ICD-10-CM | POA: Diagnosis not present

## 2011-09-27 DIAGNOSIS — H4011X0 Primary open-angle glaucoma, stage unspecified: Secondary | ICD-10-CM | POA: Diagnosis not present

## 2011-09-27 DIAGNOSIS — H409 Unspecified glaucoma: Secondary | ICD-10-CM | POA: Diagnosis not present

## 2011-11-03 DIAGNOSIS — H2510 Age-related nuclear cataract, unspecified eye: Secondary | ICD-10-CM | POA: Diagnosis not present

## 2011-11-03 DIAGNOSIS — Z961 Presence of intraocular lens: Secondary | ICD-10-CM | POA: Diagnosis not present

## 2011-11-03 DIAGNOSIS — H409 Unspecified glaucoma: Secondary | ICD-10-CM | POA: Diagnosis not present

## 2011-11-03 DIAGNOSIS — H2640 Unspecified secondary cataract: Secondary | ICD-10-CM | POA: Diagnosis not present

## 2011-11-03 DIAGNOSIS — IMO0002 Reserved for concepts with insufficient information to code with codable children: Secondary | ICD-10-CM | POA: Insufficient documentation

## 2011-11-03 DIAGNOSIS — H4011X0 Primary open-angle glaucoma, stage unspecified: Secondary | ICD-10-CM | POA: Diagnosis not present

## 2011-11-03 DIAGNOSIS — H26499 Other secondary cataract, unspecified eye: Secondary | ICD-10-CM | POA: Insufficient documentation

## 2011-11-09 DIAGNOSIS — L0291 Cutaneous abscess, unspecified: Secondary | ICD-10-CM | POA: Diagnosis not present

## 2012-01-05 DIAGNOSIS — H2640 Unspecified secondary cataract: Secondary | ICD-10-CM | POA: Diagnosis not present

## 2012-02-29 DIAGNOSIS — I10 Essential (primary) hypertension: Secondary | ICD-10-CM | POA: Diagnosis not present

## 2012-02-29 DIAGNOSIS — E119 Type 2 diabetes mellitus without complications: Secondary | ICD-10-CM | POA: Diagnosis not present

## 2012-03-06 DIAGNOSIS — N3946 Mixed incontinence: Secondary | ICD-10-CM | POA: Diagnosis not present

## 2012-03-14 DIAGNOSIS — Z23 Encounter for immunization: Secondary | ICD-10-CM | POA: Diagnosis not present

## 2012-03-14 DIAGNOSIS — I10 Essential (primary) hypertension: Secondary | ICD-10-CM | POA: Diagnosis not present

## 2012-03-14 DIAGNOSIS — E119 Type 2 diabetes mellitus without complications: Secondary | ICD-10-CM | POA: Diagnosis not present

## 2012-03-14 DIAGNOSIS — Z0000 Encounter for general adult medical examination without abnormal findings: Secondary | ICD-10-CM | POA: Diagnosis not present

## 2012-03-14 DIAGNOSIS — K219 Gastro-esophageal reflux disease without esophagitis: Secondary | ICD-10-CM | POA: Diagnosis not present

## 2012-03-14 DIAGNOSIS — E039 Hypothyroidism, unspecified: Secondary | ICD-10-CM | POA: Diagnosis not present

## 2012-03-19 ENCOUNTER — Ambulatory Visit (INDEPENDENT_AMBULATORY_CARE_PROVIDER_SITE_OTHER): Payer: Medicare Other | Admitting: Family Medicine

## 2012-03-19 ENCOUNTER — Ambulatory Visit: Payer: Medicare Other

## 2012-03-19 VITALS — BP 131/78 | HR 69 | Temp 98.7°F | Resp 16 | Ht 59.0 in | Wt 110.0 lb

## 2012-03-19 DIAGNOSIS — R0781 Pleurodynia: Secondary | ICD-10-CM

## 2012-03-19 DIAGNOSIS — R079 Chest pain, unspecified: Secondary | ICD-10-CM | POA: Diagnosis not present

## 2012-03-19 MED ORDER — MELOXICAM 15 MG PO TABS: 15.0000 mg | ORAL_TABLET | Freq: Every day | ORAL | Status: DC | PRN

## 2012-03-19 NOTE — Progress Notes (Signed)
Subjective:    Patient ID: Jade [name], female    DOB: 04-Jul-1927, 78 y.o.   MRN: [id number]  HPI Ms. Tuchscherer is a delightful 78 yo woman. 2d ago she tripped over the bottom of her pjs and tumbled forward and hit the corner of a side table into her right lateral chest - side of her breast.  She has had increasing pain there since - is very tender to palpation, feels better if she holds her hand against it when she has to cough or sneeze. Hurts to take a deep breath or move to quickly. She did take a BC powder yest which helped a lot but only for a few hrs. Has not tried any other medicines - really doesn't want anything that will make her tired She did not hit her head or have any other injuries from the fall.  Past Medical History  Diagnosis Date  . Bronchitis   . Allergic rhinitis   . Type 2 diabetes mellitus   . Hypertension   . Hypothyroid   . Glaucoma(365)   . Internal hemorrhoids   . Diverticulosis   . Tubular adenoma polyp of rectum 02/2002  . GERD (gastroesophageal reflux disease)   . Hyperlipidemia   . Cataract   . Diabetes mellitus    Current Outpatient Prescriptions on File Prior to Visit  Medication Sig Dispense Refill  . aspirin 81 MG tablet Take 81 mg by mouth daily.        Marland Kitchen atorvastatin (LIPITOR) 10 MG tablet       . BETIMOL 0.5 % ophthalmic solution       . Cholecalciferol (VITAMIN D) 1000 UNITS capsule Take 1,000 Units by mouth daily.        . Coenzyme Q10 (COQ10) 50 MG CAPS Take 1 capsule by mouth daily.        Marland Kitchen esomeprazole (NEXIUM) 40 MG capsule Take 40 mg by mouth daily.        Marland Kitchen estradiol (VIVELLE-DOT) 0.075 MG/24HR as directed.        . fesoterodine (TOVIAZ) 4 MG TB24 Take 4 mg by mouth daily.        . fish oil-omega-3 fatty acids 1000 MG capsule Take 1 capsule by mouth daily.        Marland Kitchen levothyroxine (SYNTHROID, LEVOTHROID) 75 MCG tablet Take 75 mcg by mouth daily.        . metFORMIN (GLUCOPHAGE) 500 MG tablet Take 500 mg by mouth daily.        .  Multiple Vitamin (MULTIVITAMIN) capsule Take 1 capsule by mouth daily.        . nitrofurantoin (MACRODANTIN) 100 MG capsule Take 100 mg by mouth daily.        Marland Kitchen triamterene-hydrochlorothiazide (MAXZIDE) 75-50 MG per tablet Take 1 tablet by mouth daily.        Marland Kitchen losartan (COZAAR) 50 MG tablet Take 50 mg by mouth daily.         Allergies  Allergen Reactions  . Codeine   . Pentazocine Lactate   . Tramadol Hcl   'Pt reports that codeine and tramadol cause her to see flashing lights and pass out.  Review of Systems  HENT: Positive for sneezing.   Respiratory: Negative for cough, shortness of breath and wheezing.   Cardiovascular: Positive for chest pain.       BP 131/78  Pulse 69  Temp 98.7 F (37.1 C) (Oral)  Resp 16  Ht 4\' 11"  (1.499 m)  Wt 110  lb (49.896 kg)  BMI 22.22 kg/m2  SpO2 99% Objective:   Physical Exam  Constitutional: She is oriented to person, place, and time. She appears well-developed and well-nourished. No distress.  HENT:  Head: Normocephalic and atraumatic.  Right Ear: External ear normal.  Left Ear: External ear normal.  Eyes: Conjunctivae normal are normal. No scleral icterus.  Neck: Normal range of motion. Neck supple. No thyromegaly present.  Cardiovascular: Normal rate, regular rhythm, normal heart sounds and intact distal pulses.   Pulmonary/Chest: Effort normal and breath sounds normal. No respiratory distress. She exhibits tenderness and bony tenderness. She exhibits no laceration, no crepitus, no deformity, no swelling and no retraction.    Musculoskeletal: She exhibits no edema.  Lymphadenopathy:    She has no cervical adenopathy.  Neurological: She is alert and oriented to person, place, and time.  Skin: Skin is warm, dry and intact. No bruising and no ecchymosis noted. She is not diaphoretic. No erythema.  Psychiatric: She has a normal mood and affect. Her behavior is normal.     [facility] reading (PRIMARY) by  Dr. Clelia Croft. No rib abnormality  seen.  Assessment & Plan:   1. Rib pain on right side  DG Ribs Unilateral W/Chest Right, meloxicam (MOBIC) 15 MG tablet  Restart mobic - can brace or wrap as needed for comfort. Keep deep breathing. RTC if pain worsens, SHoB, or cough.

## 2012-03-22 NOTE — Progress Notes (Signed)
Reviewed and agree.

## 2012-04-24 DIAGNOSIS — H409 Unspecified glaucoma: Secondary | ICD-10-CM | POA: Diagnosis not present

## 2012-04-24 DIAGNOSIS — H2510 Age-related nuclear cataract, unspecified eye: Secondary | ICD-10-CM | POA: Diagnosis not present

## 2012-04-24 DIAGNOSIS — H4011X0 Primary open-angle glaucoma, stage unspecified: Secondary | ICD-10-CM | POA: Diagnosis not present

## 2012-04-24 DIAGNOSIS — H02839 Dermatochalasis of unspecified eye, unspecified eyelid: Secondary | ICD-10-CM | POA: Diagnosis not present

## 2012-05-02 DIAGNOSIS — R05 Cough: Secondary | ICD-10-CM | POA: Diagnosis not present

## 2012-05-31 DIAGNOSIS — R350 Frequency of micturition: Secondary | ICD-10-CM | POA: Diagnosis not present

## 2012-06-06 DIAGNOSIS — L578 Other skin changes due to chronic exposure to nonionizing radiation: Secondary | ICD-10-CM | POA: Diagnosis not present

## 2012-06-06 DIAGNOSIS — L570 Actinic keratosis: Secondary | ICD-10-CM | POA: Diagnosis not present

## 2012-08-29 DIAGNOSIS — L570 Actinic keratosis: Secondary | ICD-10-CM | POA: Diagnosis not present

## 2012-08-30 ENCOUNTER — Telehealth: Payer: Self-pay | Admitting: Internal Medicine

## 2012-08-30 DIAGNOSIS — H4011X0 Primary open-angle glaucoma, stage unspecified: Secondary | ICD-10-CM | POA: Diagnosis not present

## 2012-08-30 NOTE — Telephone Encounter (Signed)
Called pt x's 3 to make next ov per recall.  Pt never returned calls.  Mailed recall letter 08/30/12. Jade [name] °

## 2012-09-04 DIAGNOSIS — E119 Type 2 diabetes mellitus without complications: Secondary | ICD-10-CM | POA: Diagnosis not present

## 2012-09-14 DIAGNOSIS — E119 Type 2 diabetes mellitus without complications: Secondary | ICD-10-CM | POA: Diagnosis not present

## 2012-09-14 DIAGNOSIS — E039 Hypothyroidism, unspecified: Secondary | ICD-10-CM | POA: Diagnosis not present

## 2012-09-14 DIAGNOSIS — E785 Hyperlipidemia, unspecified: Secondary | ICD-10-CM | POA: Diagnosis not present

## 2012-09-14 DIAGNOSIS — Z0000 Encounter for general adult medical examination without abnormal findings: Secondary | ICD-10-CM | POA: Diagnosis not present

## 2012-10-23 DIAGNOSIS — H4011X0 Primary open-angle glaucoma, stage unspecified: Secondary | ICD-10-CM | POA: Diagnosis not present

## 2012-10-23 DIAGNOSIS — H409 Unspecified glaucoma: Secondary | ICD-10-CM | POA: Diagnosis not present

## 2012-10-23 DIAGNOSIS — H2510 Age-related nuclear cataract, unspecified eye: Secondary | ICD-10-CM | POA: Diagnosis not present

## 2012-10-26 ENCOUNTER — Ambulatory Visit (INDEPENDENT_AMBULATORY_CARE_PROVIDER_SITE_OTHER): Payer: Medicare Other | Admitting: Internal Medicine

## 2012-10-26 ENCOUNTER — Encounter: Payer: Self-pay | Admitting: Internal Medicine

## 2012-10-26 VITALS — BP 120/70 | HR 71 | Ht 60.0 in | Wt 108.0 lb

## 2012-10-26 DIAGNOSIS — J40 Bronchitis, not specified as acute or chronic: Secondary | ICD-10-CM | POA: Diagnosis not present

## 2012-10-26 DIAGNOSIS — J309 Allergic rhinitis, unspecified: Secondary | ICD-10-CM

## 2012-10-26 DIAGNOSIS — J302 Other seasonal allergic rhinitis: Secondary | ICD-10-CM

## 2012-10-26 NOTE — Progress Notes (Signed)
Subjective:    Patient ID: Jade [name], female    DOB: Sep 13, 1927, 79 y.o.   MRN: [id number]  HPI 09/01/10- 29 yoF followed for bronchitis and allergic rhinitis Last here February 03, 2010. She still goes dancing 3 days/ week, CXR Oct 11 showed stable CE but NAD.  With Spring season pollen has caused some stuffiness and drainange., but no major cough or wheeze., Hasn't tried antihistamaine. Over time she feels she is slowly more short of breath, but it hasn't prevented her dancing and there has been no sudden event.   08/31/11- 83 yoF followed for bronchitis and allergic rhinitis Drainage in back of throat at night, sneezing(yellow in color), nasal congestion; Denies any wheezing or SOB at this time. Post nasal drip does not burn at throat is not sore. She continues to dance regularly which is great exercise for her. Admits occasional minor "hack" but not really aware of shortness of breath.  10/26/12- 85 yoF never smoker  followed for bronchitis and allergic rhinitis Nasal congestion with clear mucus - started this am.  PND at time with nonprod cough and sinus pressure.  No SOB, wheezing, chest tightness, chest pain, or f/c/s. Blames changes in the weather for worsening nasal congestion. Not using Claritin now. No shortness of breath. Still dancing. CXR 09/06/11 IMPRESSION:  1. No acute cardiopulmonary abnormalities.  Original Report Authenticated By: Rosealee Albee, M.D.   ROS-see HPI Constitutional:   No-   weight loss, night sweats, fevers, chills, fatigue, lassitude. HEENT:   No-  headaches, difficulty swallowing, tooth/dental problems, sore throat,       + sneezing, itching, ear ache, nasal congestion, post nasal drip,  CV:  No-   chest pain, orthopnea, PND, swelling in lower extremities, anasarca, dizziness, palpitations Resp: No-   shortness of breath with exertion or at rest.              No-   productive cough,  + non-productive cough,  No- coughing up of blood.              +   change in color of mucus.  No- wheezing.   Skin: No-   rash or lesions. GI:  No-   heartburn, indigestion, abdominal pain, nausea, vomiting,  GU: . MS:  No-   joint pain or swelling.   Neuro-     nothing unusual Psych:  No- change in mood or affect. No depression or anxiety.  No memory loss.   OBJ- Physical Exam General- Alert, Oriented, Affect-appropriate, Distress- none acute Skin- rash-none, lesions- none, excoriation- none Lymphadenopathy- none Head- atraumatic            Eyes- Gross vision intact, PERRLA, conjunctivae and secretions clear            Ears- Hearing, canals-normal            Nose- + turbinate edema, no-Septal dev, mucus, polyps, erosion, perforation             Throat- Mallampati III , mucosa clear , drainage- none, tonsils- atrophic, + dentures Neck- flexible , trachea midline, no stridor , thyroid nl, carotid no bruit Chest - symmetrical excursion , unlabored           Heart/CV- RRR , no murmur , no gallop  , no rub, nl s1 s2                           - JVD- none ,  edema- none, stasis changes- none, varices- none           Lung- clear, wheeze- none, cough- none , dullness-none, rub- none           Chest wall-  Abd-  Br/ Gen/ Rectal- Not done, not indicated Extrem- cyanosis- none, clubbing, none, atrophy- none, strength- nl Neuro- grossly intact to observation

## 2012-10-26 NOTE — Patient Instructions (Addendum)
Sample Dymista nasal spray    1-2 puffs each nostril once daily at bedtime   See if it helps the stuffy nose  Please call as needed

## 2012-11-12 NOTE — Assessment & Plan Note (Signed)
Aggravated by weather changes. We discussed this. No intervention required now.

## 2012-11-12 NOTE — Assessment & Plan Note (Signed)
Adequate control. We did talk about antihistamines and nasal sprays.  Plan-sample Dymista nasal spray for trial

## 2012-12-06 DIAGNOSIS — R42 Dizziness and giddiness: Secondary | ICD-10-CM | POA: Diagnosis not present

## 2012-12-06 DIAGNOSIS — J3489 Other specified disorders of nose and nasal sinuses: Secondary | ICD-10-CM | POA: Diagnosis not present

## 2012-12-08 DIAGNOSIS — R42 Dizziness and giddiness: Secondary | ICD-10-CM | POA: Diagnosis not present

## 2012-12-08 DIAGNOSIS — J3489 Other specified disorders of nose and nasal sinuses: Secondary | ICD-10-CM | POA: Diagnosis not present

## 2012-12-18 DIAGNOSIS — R42 Dizziness and giddiness: Secondary | ICD-10-CM | POA: Diagnosis not present

## 2012-12-18 DIAGNOSIS — R269 Unspecified abnormalities of gait and mobility: Secondary | ICD-10-CM | POA: Diagnosis not present

## 2012-12-18 DIAGNOSIS — H9209 Otalgia, unspecified ear: Secondary | ICD-10-CM | POA: Diagnosis not present

## 2012-12-28 DIAGNOSIS — M542 Cervicalgia: Secondary | ICD-10-CM | POA: Diagnosis not present

## 2012-12-28 DIAGNOSIS — R42 Dizziness and giddiness: Secondary | ICD-10-CM | POA: Diagnosis not present

## 2012-12-28 DIAGNOSIS — E7800 Pure hypercholesterolemia, unspecified: Secondary | ICD-10-CM | POA: Diagnosis not present

## 2013-01-11 DIAGNOSIS — R42 Dizziness and giddiness: Secondary | ICD-10-CM | POA: Diagnosis not present

## 2013-01-11 DIAGNOSIS — Z23 Encounter for immunization: Secondary | ICD-10-CM | POA: Diagnosis not present

## 2013-02-14 DIAGNOSIS — Z961 Presence of intraocular lens: Secondary | ICD-10-CM | POA: Diagnosis not present

## 2013-02-14 DIAGNOSIS — H2510 Age-related nuclear cataract, unspecified eye: Secondary | ICD-10-CM | POA: Diagnosis not present

## 2013-02-14 DIAGNOSIS — H524 Presbyopia: Secondary | ICD-10-CM | POA: Diagnosis not present

## 2013-02-14 DIAGNOSIS — H4011X0 Primary open-angle glaucoma, stage unspecified: Secondary | ICD-10-CM | POA: Diagnosis not present

## 2013-02-20 ENCOUNTER — Other Ambulatory Visit: Payer: Self-pay | Admitting: Internal Medicine

## 2013-02-20 DIAGNOSIS — R51 Headache: Secondary | ICD-10-CM | POA: Diagnosis not present

## 2013-02-20 DIAGNOSIS — E7800 Pure hypercholesterolemia, unspecified: Secondary | ICD-10-CM | POA: Diagnosis not present

## 2013-02-20 DIAGNOSIS — I10 Essential (primary) hypertension: Secondary | ICD-10-CM | POA: Diagnosis not present

## 2013-02-20 DIAGNOSIS — E119 Type 2 diabetes mellitus without complications: Secondary | ICD-10-CM | POA: Diagnosis not present

## 2013-02-20 DIAGNOSIS — R5381 Other malaise: Secondary | ICD-10-CM | POA: Diagnosis not present

## 2013-02-20 DIAGNOSIS — R42 Dizziness and giddiness: Secondary | ICD-10-CM | POA: Diagnosis not present

## 2013-02-26 DIAGNOSIS — N3020 Other chronic cystitis without hematuria: Secondary | ICD-10-CM | POA: Diagnosis not present

## 2013-02-26 DIAGNOSIS — N3946 Mixed incontinence: Secondary | ICD-10-CM | POA: Diagnosis not present

## 2013-02-28 ENCOUNTER — Ambulatory Visit
Admission: RE | Admit: 2013-02-28 | Discharge: 2013-02-28 | Disposition: A | Discharge status: Home / Self Care | Payer: Medicare Other | Source: Ambulatory Visit | Attending: Internal Medicine | Admitting: Internal Medicine

## 2013-02-28 DIAGNOSIS — R42 Dizziness and giddiness: Secondary | ICD-10-CM | POA: Diagnosis not present

## 2013-02-28 DIAGNOSIS — R51 Headache: Secondary | ICD-10-CM | POA: Diagnosis not present

## 2013-03-13 DIAGNOSIS — I10 Essential (primary) hypertension: Secondary | ICD-10-CM | POA: Diagnosis not present

## 2013-03-13 DIAGNOSIS — E119 Type 2 diabetes mellitus without complications: Secondary | ICD-10-CM | POA: Diagnosis not present

## 2013-03-13 DIAGNOSIS — E785 Hyperlipidemia, unspecified: Secondary | ICD-10-CM | POA: Diagnosis not present

## 2013-03-16 ENCOUNTER — Ambulatory Visit: Payer: Medicare Other | Attending: Internal Medicine | Admitting: Physical Therapy

## 2013-03-16 DIAGNOSIS — IMO0001 Reserved for inherently not codable concepts without codable children: Secondary | ICD-10-CM | POA: Diagnosis not present

## 2013-03-16 DIAGNOSIS — R42 Dizziness and giddiness: Secondary | ICD-10-CM | POA: Insufficient documentation

## 2013-03-16 DIAGNOSIS — R269 Unspecified abnormalities of gait and mobility: Secondary | ICD-10-CM | POA: Diagnosis not present

## 2013-03-27 ENCOUNTER — Ambulatory Visit: Payer: Medicare Other | Attending: Internal Medicine | Admitting: Physical Therapy

## 2013-03-27 DIAGNOSIS — R269 Unspecified abnormalities of gait and mobility: Secondary | ICD-10-CM | POA: Diagnosis not present

## 2013-03-27 DIAGNOSIS — R42 Dizziness and giddiness: Secondary | ICD-10-CM | POA: Insufficient documentation

## 2013-03-27 DIAGNOSIS — IMO0001 Reserved for inherently not codable concepts without codable children: Secondary | ICD-10-CM | POA: Insufficient documentation

## 2013-04-03 ENCOUNTER — Ambulatory Visit: Payer: Medicare Other | Admitting: Physical Therapy

## 2013-04-03 DIAGNOSIS — R42 Dizziness and giddiness: Secondary | ICD-10-CM | POA: Diagnosis not present

## 2013-04-03 DIAGNOSIS — IMO0001 Reserved for inherently not codable concepts without codable children: Secondary | ICD-10-CM | POA: Diagnosis not present

## 2013-04-03 DIAGNOSIS — R269 Unspecified abnormalities of gait and mobility: Secondary | ICD-10-CM | POA: Diagnosis not present

## 2013-04-06 DIAGNOSIS — H2510 Age-related nuclear cataract, unspecified eye: Secondary | ICD-10-CM | POA: Diagnosis not present

## 2013-04-10 ENCOUNTER — Encounter: Payer: Medicare Other | Admitting: Physical Therapy

## 2013-04-11 ENCOUNTER — Ambulatory Visit: Payer: Medicare Other | Admitting: Physical Therapy

## 2013-04-11 DIAGNOSIS — R269 Unspecified abnormalities of gait and mobility: Secondary | ICD-10-CM | POA: Diagnosis not present

## 2013-04-11 DIAGNOSIS — IMO0001 Reserved for inherently not codable concepts without codable children: Secondary | ICD-10-CM | POA: Diagnosis not present

## 2013-04-11 DIAGNOSIS — R42 Dizziness and giddiness: Secondary | ICD-10-CM | POA: Diagnosis not present

## 2013-04-17 ENCOUNTER — Ambulatory Visit: Payer: Medicare Other | Admitting: Physical Therapy

## 2013-04-30 DIAGNOSIS — H409 Unspecified glaucoma: Secondary | ICD-10-CM | POA: Diagnosis not present

## 2013-04-30 DIAGNOSIS — H4011X0 Primary open-angle glaucoma, stage unspecified: Secondary | ICD-10-CM | POA: Diagnosis not present

## 2013-04-30 DIAGNOSIS — H401132 Primary open-angle glaucoma, bilateral, moderate stage: Secondary | ICD-10-CM | POA: Insufficient documentation

## 2013-05-18 IMAGING — CR DG CHEST 2V
2 series · 2 of 2 positions shown · non-contrast
Comparison: 02/03/2010

CLINICAL DATA: Dyspnea on exertion

CHEST - 2 VIEW

[view not recorded (1 of 2)]
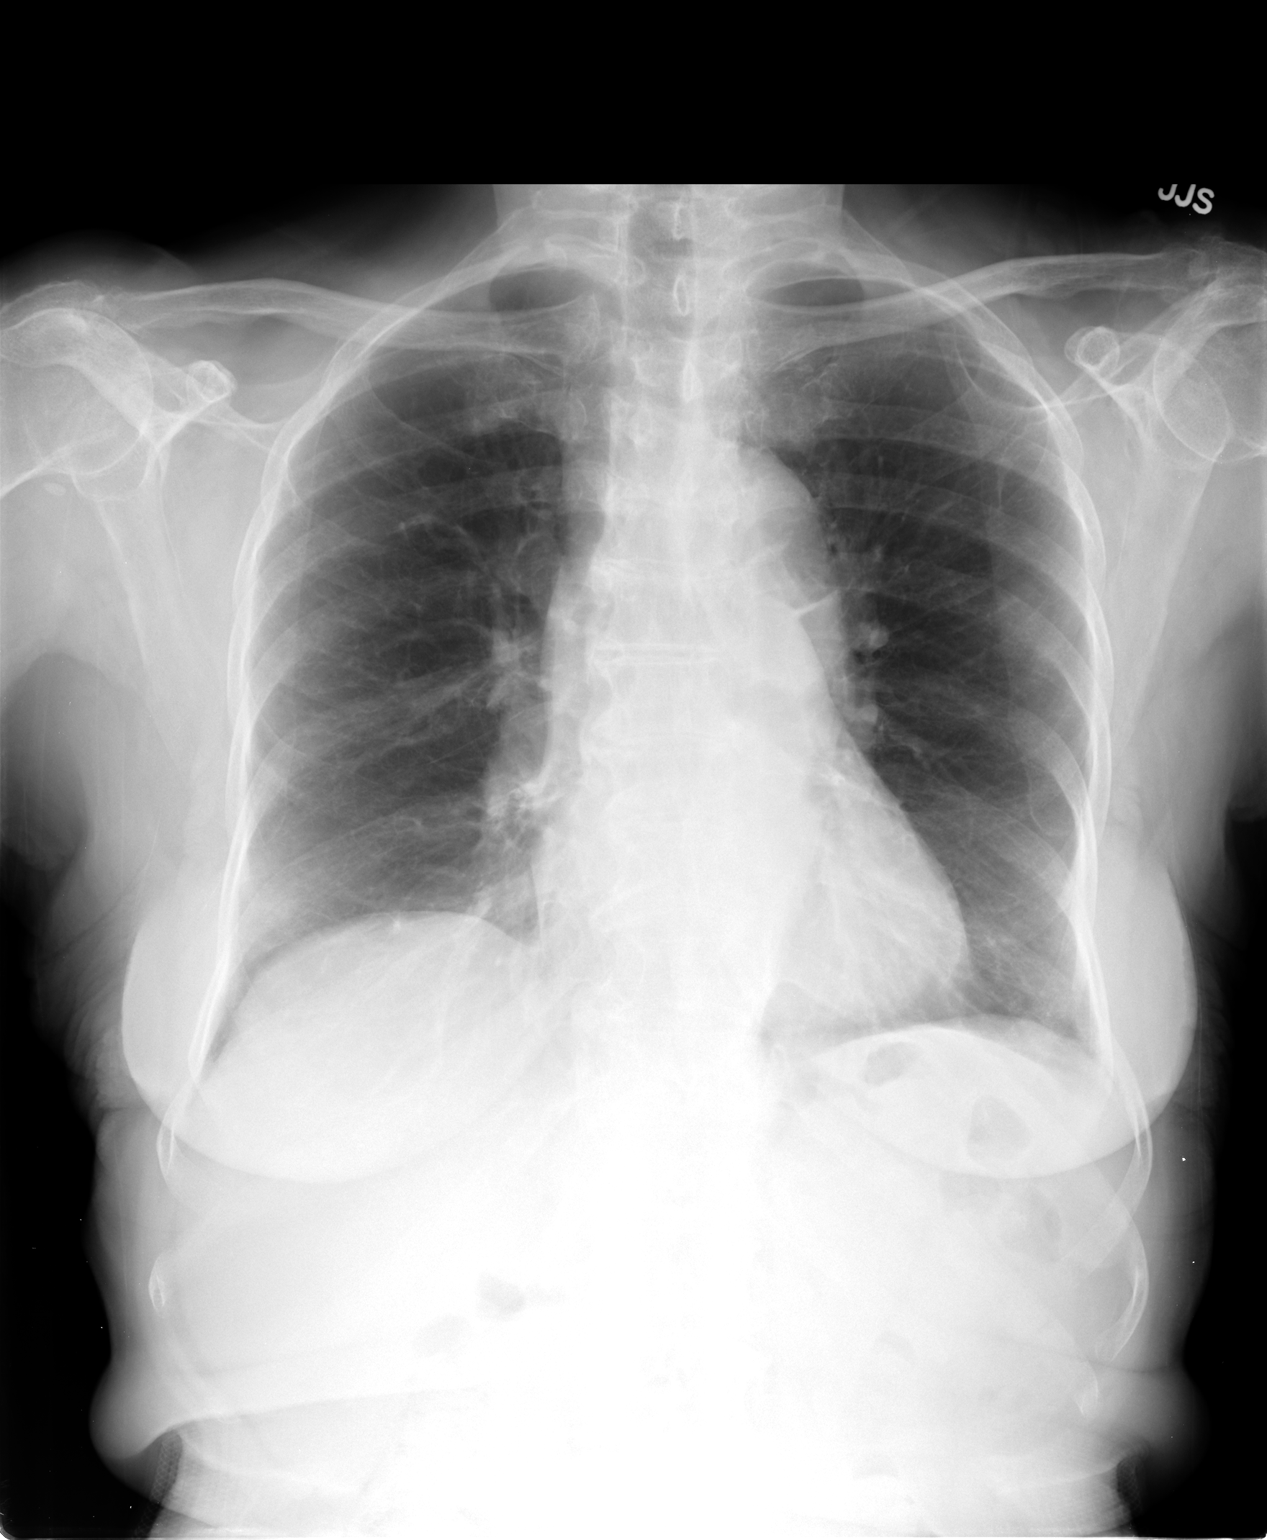

[view not recorded (2 of 2)]
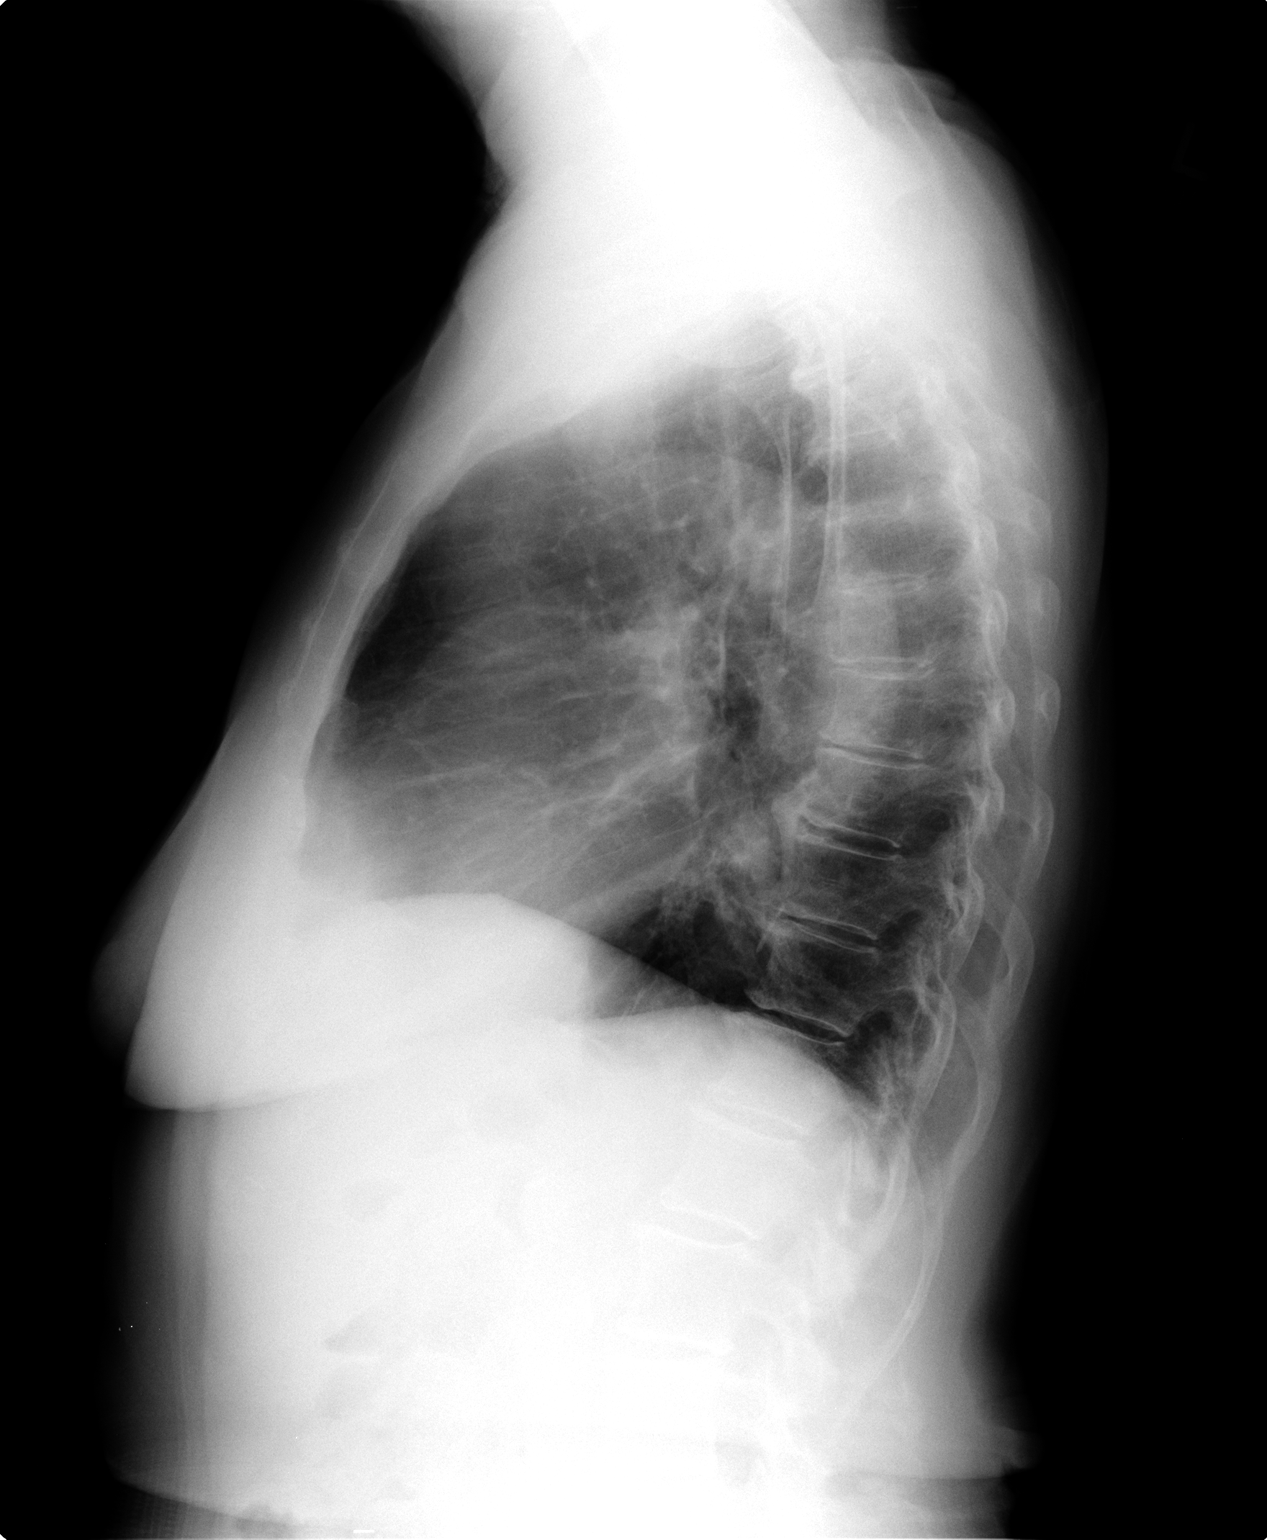

[2 of 2 positions shown; findings below may reference images not displayed]

FINDINGS: Heart size is normal.

No pleural effusion or pulmonary edema.

There is no airspace consolidation identified.

Mild multilevel level spondylosis is identified within the thoracic
spine.
IMPRESSION: 1.  No acute cardiopulmonary abnormalities.

## 2013-07-24 ENCOUNTER — Ambulatory Visit (INDEPENDENT_AMBULATORY_CARE_PROVIDER_SITE_OTHER): Payer: Medicare Other | Admitting: Gastroenterology

## 2013-07-24 ENCOUNTER — Encounter: Payer: Self-pay | Admitting: Gastroenterology

## 2013-07-24 ENCOUNTER — Other Ambulatory Visit (INDEPENDENT_AMBULATORY_CARE_PROVIDER_SITE_OTHER): Payer: Medicare Other

## 2013-07-24 VITALS — BP 114/62 | HR 60 | Ht 59.0 in | Wt 106.0 lb

## 2013-07-24 DIAGNOSIS — K625 Hemorrhage of anus and rectum: Secondary | ICD-10-CM

## 2013-07-24 LAB — CBC WITH DIFFERENTIAL/PLATELET
BASOS PCT: 0.8 % (ref 0.0–3.0)
Basophils Absolute: 0.1 10*3/uL (ref 0.0–0.1)
EOS ABS: 0.4 10*3/uL (ref 0.0–0.7)
Eosinophils Relative: 4.6 % (ref 0.0–5.0)
HEMATOCRIT: 36.1 % (ref 36.0–46.0)
Hemoglobin: 12.1 g/dL (ref 12.0–15.0)
LYMPHS ABS: 2.1 10*3/uL (ref 0.7–4.0)
Lymphocytes Relative: 23.4 % (ref 12.0–46.0)
MCHC: 33.5 g/dL (ref 30.0–36.0)
MCV: 90.5 fl (ref 78.0–100.0)
MONO ABS: 0.6 10*3/uL (ref 0.1–1.0)
Monocytes Relative: 7.2 % (ref 3.0–12.0)
NEUTROS PCT: 64 % (ref 43.0–77.0)
Neutro Abs: 5.7 10*3/uL (ref 1.4–7.7)
Platelets: 251 10*3/uL (ref 150.0–400.0)
RBC: 3.99 Mil/uL (ref 3.87–5.11)
RDW: 13.5 % (ref 11.5–14.6)
WBC: 8.9 10*3/uL (ref 4.5–10.5)

## 2013-07-24 NOTE — Progress Notes (Signed)
Reviewed and agree with management plan.  Gracieann Stannard T. Maury Groninger, MD FACG 

## 2013-07-24 NOTE — Progress Notes (Signed)
07/24/2013 Jade [name] [id number] 12-28-1927   History of Present Illness:  This is a pleasant 80 year old female who is known to Dr. Fuller Plan for previous colonoscopy.  Last colonoscopy was in 11/2010 at which time she had a pedunculated polyp at the hepatic flexure, diverticulosis, and internal hemorrhoids. The polyp was a tubular adenoma but no further screening colonoscopies were recommended due to her age.  She presents to our office today with complaints of rectal bleeding. She states that she had not moved her bowels in a couple of days, but on Saturday when she finally moved her bowels she had several bowel movements, the last two of which were diarrhea and had a moderate amount of blood in the toilet bowl. Sunday she had 2 episodes of diarrhea with blood as well.  Saturday and Sunday she did take some Pepto-Bismol. Yesterday she had no further red blood, but her stools were very black. Today she had 3 bowel movements and she describes them as "gray " in color. At first she had some slight nausea, but no vomiting. She did have some mild left-sided abdominal discomfort at first as well. She has been eating a bland diet since this began.   Current Medications, Allergies, Past Medical History, Past Surgical History, Family History and Social History were reviewed in [employer] record.   Physical Exam: BP 114/62  Pulse 60  Ht 4\' 11"  (1.499 m)  Wt 106 lb (48.081 kg)  BMI 21.40 kg/m2 General: Well developed elderly white female in no acute distress Head: Normocephalic and atraumatic Eyes:  Sclerae anicteric, conjunctiva pink  Ears: Normal auditory acuity Lungs: Clear throughout to auscultation Heart: Regular rate and rhythm Abdomen: Soft, non-distended.  Normal bowel sounds.  Minimal discomfort on palpation of the left side of the abdomen. Rectal:  Large external hemorrhoid noted without bleeding.  DRE revealed no tenderness or masses.  Small amount of light brown  stool seen on the exam glove and stool was heme negative. Musculoskeletal: Symmetrical with no gross deformities  Extremities: No edema  Neurological: Alert oriented x 4, grossly non-focal Psychological:  Alert and cooperative. Normal mood and affect  Assessment and Recommendations: -Rectal bleeding:  Self limited and now resolved.  ? Hemorrhoidal vs low-grade diverticular bleed since she did have some lower abdominal cramping initially.  Will check CBC today, but otherwise offered reassurance and asked patient to call back if bleeding recurs.

## 2013-07-24 NOTE — Patient Instructions (Signed)
Your physician has requested that you go to the basement for the following lab work before leaving today: CBC  Please follow up as needed

## 2013-08-27 DIAGNOSIS — Z01419 Encounter for gynecological examination (general) (routine) without abnormal findings: Secondary | ICD-10-CM | POA: Diagnosis not present

## 2013-08-27 DIAGNOSIS — N9489 Other specified conditions associated with female genital organs and menstrual cycle: Secondary | ICD-10-CM | POA: Diagnosis not present

## 2013-08-27 DIAGNOSIS — Z1231 Encounter for screening mammogram for malignant neoplasm of breast: Secondary | ICD-10-CM | POA: Diagnosis not present

## 2013-08-27 DIAGNOSIS — N959 Unspecified menopausal and perimenopausal disorder: Secondary | ICD-10-CM | POA: Diagnosis not present

## 2013-08-27 DIAGNOSIS — Z1239 Encounter for other screening for malignant neoplasm of breast: Secondary | ICD-10-CM | POA: Diagnosis not present

## 2013-08-27 DIAGNOSIS — Z0000 Encounter for general adult medical examination without abnormal findings: Secondary | ICD-10-CM | POA: Diagnosis not present

## 2013-08-28 ENCOUNTER — Other Ambulatory Visit: Payer: Self-pay | Admitting: Gynecology

## 2013-08-28 DIAGNOSIS — Z1231 Encounter for screening mammogram for malignant neoplasm of breast: Secondary | ICD-10-CM

## 2013-09-06 ENCOUNTER — Ambulatory Visit: Payer: Medicare Other

## 2013-09-06 ENCOUNTER — Ambulatory Visit (INDEPENDENT_AMBULATORY_CARE_PROVIDER_SITE_OTHER): Payer: Medicare Other | Admitting: Emergency Medicine

## 2013-09-06 ENCOUNTER — Other Ambulatory Visit: Payer: Self-pay | Admitting: Emergency Medicine

## 2013-09-06 VITALS — BP 124/68 | HR 73 | Temp 97.7°F | Resp 16 | Ht 59.0 in | Wt 107.4 lb

## 2013-09-06 DIAGNOSIS — J209 Acute bronchitis, unspecified: Secondary | ICD-10-CM

## 2013-09-06 DIAGNOSIS — R05 Cough: Secondary | ICD-10-CM

## 2013-09-06 DIAGNOSIS — R059 Cough, unspecified: Secondary | ICD-10-CM

## 2013-09-06 MED ORDER — BENZONATATE 200 MG PO CAPS: 200.0000 mg | ORAL_CAPSULE | Freq: Two times a day (BID) | ORAL | Status: DC | PRN

## 2013-09-06 MED ORDER — AZITHROMYCIN 250 MG PO TABS: ORAL_TABLET | ORAL | Status: DC

## 2013-09-06 NOTE — Progress Notes (Signed)
[facility] [address] [place] [address] [phone]  Date:  09/06/2013   Name:  Jade [name]   DOB:  12/09/1927   MRN:  [id number]  PCP:  Tommy Medal, MD    Chief Complaint: Cough   History of Present Illness:  Jade [name] is a 80 y.o. very pleasant female patient who presents with the following:  Ill past three days with a cough.  Says she has no wheezing or shortness of breath.  Cough is productive of mucopurulent sputum.  No fever or chills, coryza or nausea and vomiting.   No rash or stool change. Appetite is normal. No improvement with over the counter medications or other home remedies. Denies other complaint or health concern today.   Patient Active Problem List   Diagnosis Date Noted  . Rectal bleeding 07/24/2013  . DYSPNEA ON EXERTION 02/03/2010  . HYPOTHYROIDISM 08/07/2008  . DIABETES, TYPE 2 08/07/2008  . HYPERTENSION 08/07/2008  . Seasonal and perennial allergic rhinitis 08/09/2007  . BRONCHITIS 08/09/2007    Past Medical History  Diagnosis Date  . Bronchitis   . Allergic rhinitis   . Type 2 diabetes mellitus   . Hypertension   . Hypothyroid   . Glaucoma   . Internal hemorrhoids   . Diverticulosis   . Tubular adenoma polyp of rectum 02/2002  . GERD (gastroesophageal reflux disease)   . Hyperlipidemia   . Cataract   . Diabetes mellitus     Past Surgical History  Procedure Laterality Date  . Tonsillectomy    . Breast cyst excision    . Total abdominal hysterectomy w/ bilateral salpingoophorectomy    . Cholecystectomy    . Appendectomy    . Hernia repair      w/ mesh  . Colonoscopy    . Polypectomy    . Diverticulitis  2004    SURGERY FOR DIVERTICULITIS PER PT.    History  Substance Use Topics  . Smoking status: Never Smoker   . Smokeless tobacco: Not on file  . Alcohol Use: No    Family History  Problem Relation Age of Onset  . Lung cancer Father   . Heart disease Mother   . Colon cancer Neg Hx    . Heart disease Brother   . Diabetes Maternal Aunt   . Diabetes Maternal Uncle   . Diabetes Brother     Allergies  Allergen Reactions  . Codeine   . Pentazocine Lactate   . Tramadol Hcl     Medication list has been reviewed and updated.  Current Outpatient Prescriptions on File Prior to Visit  Medication Sig Dispense Refill  . aspirin 81 MG tablet Take 81 mg by mouth daily.        Marland Kitchen atorvastatin (LIPITOR) 10 MG tablet Take 10 mg by mouth daily.       Marland Kitchen BETIMOL 0.5 % ophthalmic solution Place 1 drop into both eyes daily.       . Cholecalciferol (VITAMIN D) 1000 UNITS capsule Take 1,000 Units by mouth daily.        . Coenzyme Q10 (COQ10) 50 MG CAPS Take 1 capsule by mouth daily.        Marland Kitchen esomeprazole (NEXIUM) 40 MG capsule Take 40 mg by mouth daily.        Marland Kitchen estradiol (VIVELLE-DOT) 0.075 MG/24HR as directed.        . fesoterodine (TOVIAZ) 4 MG TB24 Take 4 mg by mouth daily.        Marland Kitchen  fish oil-omega-3 fatty acids 1000 MG capsule Take 1 capsule by mouth daily.        Marland Kitchen levothyroxine (SYNTHROID, LEVOTHROID) 75 MCG tablet Take 75 mcg by mouth daily.        Marland Kitchen losartan (COZAAR) 50 MG tablet Take 50 mg by mouth daily.        . meloxicam (MOBIC) 15 MG tablet Take 1 tablet (15 mg total) by mouth daily as needed for pain.  30 tablet  0  . metFORMIN (GLUCOPHAGE) 500 MG tablet Take 500 mg by mouth 2 (two) times daily with a meal.       . nitrofurantoin (MACRODANTIN) 100 MG capsule Take 100 mg by mouth daily.        Marland Kitchen triamterene-hydrochlorothiazide (MAXZIDE) 75-50 MG per tablet Take 0.5 tablets by mouth daily.        No current facility-administered medications on file prior to visit.    Review of Systems:  As per HPI, otherwise negative.    Physical Examination: Filed Vitals:   09/06/13 1316  BP: 124/68  Pulse: 73  Temp: 97.7 F (36.5 C)  Resp: 16   Filed Vitals:   09/06/13 1316  Height: 4\' 11"  (1.499 m)  Weight: 107 lb 6.4 oz (48.716 kg)   Body mass index is 21.68  kg/(m^2). Ideal Body Weight: Weight in (lb) to have BMI = 25: 123.5  GEN: WDWN, NAD, Non-toxic, A & O x 3 HEENT: Atraumatic, Normocephalic. Neck supple. No masses, No LAD. Ears and Nose: No external deformity. CV: RRR, No M/G/R. No JVD. No thrill. No extra heart sounds. PULM: Bi basilar rales, no wheezes, crackles, rhonchi. No retractions. No resp. distress. No accessory muscle use. ABD: S, NT, ND, +BS. No rebound. No HSM. EXTR: No c/c/e NEURO Normal gait.  PSYCH: Normally interactive. Conversant. Not depressed or anxious appearing.  Calm demeanor.    Assessment and Plan: Bronchitis zpak  Signed,  Ellison Carwin, MD   [facility] reading (PRIMARY) by  Dr. Ouida Sills.  No acute changes.

## 2013-09-06 NOTE — Patient Instructions (Signed)

## 2013-09-11 DIAGNOSIS — E7800 Pure hypercholesterolemia, unspecified: Secondary | ICD-10-CM | POA: Diagnosis not present

## 2013-09-11 DIAGNOSIS — E119 Type 2 diabetes mellitus without complications: Secondary | ICD-10-CM | POA: Diagnosis not present

## 2013-09-11 DIAGNOSIS — E039 Hypothyroidism, unspecified: Secondary | ICD-10-CM | POA: Diagnosis not present

## 2013-09-18 DIAGNOSIS — E039 Hypothyroidism, unspecified: Secondary | ICD-10-CM | POA: Diagnosis not present

## 2013-09-18 DIAGNOSIS — Z0000 Encounter for general adult medical examination without abnormal findings: Secondary | ICD-10-CM | POA: Diagnosis not present

## 2013-09-18 DIAGNOSIS — E119 Type 2 diabetes mellitus without complications: Secondary | ICD-10-CM | POA: Diagnosis not present

## 2013-09-18 DIAGNOSIS — I10 Essential (primary) hypertension: Secondary | ICD-10-CM | POA: Diagnosis not present

## 2013-10-03 ENCOUNTER — Ambulatory Visit: Payer: Medicare Other | Admitting: Podiatrist

## 2013-10-05 ENCOUNTER — Ambulatory Visit: Payer: Medicare Other | Admitting: Podiatrist

## 2013-10-24 ENCOUNTER — Ambulatory Visit (INDEPENDENT_AMBULATORY_CARE_PROVIDER_SITE_OTHER): Payer: Medicare Other | Admitting: Podiatrist

## 2013-10-24 ENCOUNTER — Ambulatory Visit (INDEPENDENT_AMBULATORY_CARE_PROVIDER_SITE_OTHER): Payer: Medicare Other

## 2013-10-24 ENCOUNTER — Encounter: Payer: Self-pay | Admitting: Podiatrist

## 2013-10-24 VITALS — BP 144/73 | HR 67 | Resp 14 | Ht 59.0 in | Wt 105.0 lb

## 2013-10-24 DIAGNOSIS — M779 Enthesopathy, unspecified: Secondary | ICD-10-CM

## 2013-10-24 DIAGNOSIS — M216X9 Other acquired deformities of unspecified foot: Secondary | ICD-10-CM

## 2013-10-24 DIAGNOSIS — M2010 Hallux valgus (acquired), unspecified foot: Secondary | ICD-10-CM

## 2013-10-24 DIAGNOSIS — M79609 Pain in unspecified limb: Secondary | ICD-10-CM

## 2013-10-24 DIAGNOSIS — B351 Tinea unguium: Secondary | ICD-10-CM

## 2013-10-24 DIAGNOSIS — M2011 Hallux valgus (acquired), right foot: Secondary | ICD-10-CM

## 2013-10-24 DIAGNOSIS — M216X1 Other acquired deformities of right foot: Secondary | ICD-10-CM

## 2013-10-24 DIAGNOSIS — M79673 Pain in unspecified foot: Secondary | ICD-10-CM

## 2013-10-24 NOTE — Progress Notes (Signed)
   Subjective:    Patient ID: Jade [name], female    DOB: Oct 08, 1927, 80 y.o.   MRN: [id number]  HPI Comments: Pt complains of right forefoot pain for 20 years after hitting a chair.  Pt complains of more pain and further misalignment of the right 4th toe and 4th MPJ plantar, and worsens with weightbearing.  Toe Pain       Review of Systems  HENT: Positive for hearing loss and sinus pressure.   Eyes:       Glaucoma - OU  Endocrine: Positive for polyuria.  Musculoskeletal: Positive for arthralgias.       Knees  Allergic/Immunologic: Positive for environmental allergies.       Seasonal.  Neurological: Positive for dizziness.  Hematological: Bruises/bleeds easily.       Slow to heal.  All other systems reviewed and are negative.      Objective:   Physical Exam Patient is awake, alert, and oriented x 3.  In no acute distress.  Vascular status is intact with palpable pedal pulses at 2/4 DP and PT bilateral and capillary refill time within normal limits. Neurological sensation is also intact bilaterally via Semmes Weinstein monofilament at 5/5 sites. Light touch, vibratory sensation, Achilles tendon reflex is intact. Dermatological exam reveals skin color, turger and texture as normal. No open lesions present.  Musculature intact with dorsiflexion, plantarflexion, inversion, eversion.  Prominent in plantarflexed fourth metatarsal head of the right foot is noted. Fat pad atrophy is present. Mild contracture of the lesser digits is also seen. Large hallux abductovalgus deformity and hallux abductus deformity is noted. Range of motion is decreased at this first metatarsophalangeal joint as well. Patient's toenails are elongated, thickened, discolored, dystrophic, clinically mycotic and uncomfortable bilateral.    Assessment & Plan:  Prominent in plantarflexed fourth metatarsal head, hammertoe contracture, symptomatic and mycotic toenails  Plan: Discussed treatment options and  alternatives. Discussed conservative therapies including offloading pad, orthotics, and shoe gear changes. She presents in a very thin pair of keds sneakers offering little to no padding for her foot. I recommended a good supportive walking shoe for more cushioning. Discussed briefly surgical options however these are limited with the high rate of transfer lesions with these procedures. Also due to her age and the fact that she is diabetic she is a poor surgical candidate. I debrided the toenails without complication. If she would like to consider inserts for her shoes she will call.

## 2013-10-24 NOTE — Patient Instructions (Signed)
Bunion You have a bunion deformity of the feet. This is more common in women. It tends to be an inherited problem. Symptoms can include pain, swelling, and deformity around the great toe. Numbness and tingling may also be present. Your symptoms are often worsened by wearing shoes that cause pressure on the bunion. Changing the type of shoes you wear helps reduce symptoms. A wide shoe decreases pressure on the bunion. An arch support may be used if you have flat feet. Avoid shoes with heels higher than two inches. This puts more pressure on the bunion. X-rays may be helpful in evaluating the severity of the problem. Other foot problems often seen with bunions include corns, calluses, and hammer toes. If the deformity or pain is severe, surgical treatment may be necessary. Keep off your painful foot as much as possible until the pain is relieved. Call your caregiver if your symptoms are worse.  SEEK IMMEDIATE MEDICAL CARE IF:  You have increased redness, pain, swelling, or other symptoms of infection. Document Released: 04/12/2005 Document Revised: 07/05/2011 Document Reviewed: 10/10/2006 ExitCare Patient Information 2015 [employer]. This information is not intended to replace advice given to you by your health care provider. Make sure you discuss any questions you have with your health care provider.  

## 2013-11-05 DIAGNOSIS — H409 Unspecified glaucoma: Secondary | ICD-10-CM | POA: Diagnosis not present

## 2013-11-05 DIAGNOSIS — H4011X0 Primary open-angle glaucoma, stage unspecified: Secondary | ICD-10-CM | POA: Diagnosis not present

## 2013-11-05 DIAGNOSIS — H2510 Age-related nuclear cataract, unspecified eye: Secondary | ICD-10-CM | POA: Diagnosis not present

## 2014-01-31 DIAGNOSIS — H4011X2 Primary open-angle glaucoma, moderate stage: Secondary | ICD-10-CM | POA: Diagnosis not present

## 2014-01-31 DIAGNOSIS — H2511 Age-related nuclear cataract, right eye: Secondary | ICD-10-CM | POA: Diagnosis not present

## 2014-01-31 DIAGNOSIS — H25011 Cortical age-related cataract, right eye: Secondary | ICD-10-CM | POA: Diagnosis not present

## 2014-02-13 DIAGNOSIS — H25011 Cortical age-related cataract, right eye: Secondary | ICD-10-CM | POA: Diagnosis not present

## 2014-02-13 DIAGNOSIS — H25811 Combined forms of age-related cataract, right eye: Secondary | ICD-10-CM | POA: Diagnosis not present

## 2014-02-13 DIAGNOSIS — H4011X0 Primary open-angle glaucoma, stage unspecified: Secondary | ICD-10-CM | POA: Diagnosis not present

## 2014-02-13 DIAGNOSIS — H2511 Age-related nuclear cataract, right eye: Secondary | ICD-10-CM | POA: Diagnosis not present

## 2014-02-13 DIAGNOSIS — H268 Other specified cataract: Secondary | ICD-10-CM | POA: Diagnosis not present

## 2014-02-27 DIAGNOSIS — N3946 Mixed incontinence: Secondary | ICD-10-CM | POA: Diagnosis not present

## 2014-02-27 DIAGNOSIS — N3020 Other chronic cystitis without hematuria: Secondary | ICD-10-CM | POA: Diagnosis not present

## 2014-03-19 DIAGNOSIS — Z0000 Encounter for general adult medical examination without abnormal findings: Secondary | ICD-10-CM | POA: Diagnosis not present

## 2014-03-19 DIAGNOSIS — E118 Type 2 diabetes mellitus with unspecified complications: Secondary | ICD-10-CM | POA: Diagnosis not present

## 2014-03-19 DIAGNOSIS — E039 Hypothyroidism, unspecified: Secondary | ICD-10-CM | POA: Diagnosis not present

## 2014-03-19 DIAGNOSIS — E780 Pure hypercholesterolemia: Secondary | ICD-10-CM | POA: Diagnosis not present

## 2014-03-26 DIAGNOSIS — Z1389 Encounter for screening for other disorder: Secondary | ICD-10-CM | POA: Diagnosis not present

## 2014-03-26 DIAGNOSIS — Z0000 Encounter for general adult medical examination without abnormal findings: Secondary | ICD-10-CM | POA: Diagnosis not present

## 2014-03-26 DIAGNOSIS — E119 Type 2 diabetes mellitus without complications: Secondary | ICD-10-CM | POA: Diagnosis not present

## 2014-03-26 DIAGNOSIS — Z23 Encounter for immunization: Secondary | ICD-10-CM | POA: Diagnosis not present

## 2014-04-23 DIAGNOSIS — R7989 Other specified abnormal findings of blood chemistry: Secondary | ICD-10-CM | POA: Diagnosis not present

## 2014-09-04 DIAGNOSIS — E039 Hypothyroidism, unspecified: Secondary | ICD-10-CM | POA: Diagnosis not present

## 2014-09-04 DIAGNOSIS — E780 Pure hypercholesterolemia: Secondary | ICD-10-CM | POA: Diagnosis not present

## 2014-09-04 DIAGNOSIS — E119 Type 2 diabetes mellitus without complications: Secondary | ICD-10-CM | POA: Diagnosis not present

## 2014-09-04 DIAGNOSIS — I10 Essential (primary) hypertension: Secondary | ICD-10-CM | POA: Diagnosis not present

## 2014-09-10 DIAGNOSIS — E1165 Type 2 diabetes mellitus with hyperglycemia: Secondary | ICD-10-CM | POA: Diagnosis not present

## 2014-09-10 DIAGNOSIS — Z23 Encounter for immunization: Secondary | ICD-10-CM | POA: Diagnosis not present

## 2014-09-10 DIAGNOSIS — E039 Hypothyroidism, unspecified: Secondary | ICD-10-CM | POA: Diagnosis not present

## 2014-09-10 DIAGNOSIS — N281 Cyst of kidney, acquired: Secondary | ICD-10-CM | POA: Diagnosis not present

## 2014-09-10 DIAGNOSIS — E780 Pure hypercholesterolemia: Secondary | ICD-10-CM | POA: Diagnosis not present

## 2014-09-11 ENCOUNTER — Other Ambulatory Visit: Payer: Self-pay | Admitting: Internal Medicine

## 2014-09-11 DIAGNOSIS — N281 Cyst of kidney, acquired: Secondary | ICD-10-CM

## 2014-09-13 ENCOUNTER — Ambulatory Visit
Admission: RE | Admit: 2014-09-13 | Discharge: 2014-09-13 | Disposition: A | Discharge status: Home / Self Care | Payer: Medicare Other | Source: Ambulatory Visit | Attending: Internal Medicine | Admitting: Internal Medicine

## 2014-09-13 DIAGNOSIS — N261 Atrophy of kidney (terminal): Secondary | ICD-10-CM | POA: Diagnosis not present

## 2014-09-13 DIAGNOSIS — N281 Cyst of kidney, acquired: Secondary | ICD-10-CM

## 2014-10-14 ENCOUNTER — Encounter: Payer: Self-pay | Admitting: *Deleted

## 2014-10-14 DIAGNOSIS — K921 Melena: Secondary | ICD-10-CM | POA: Diagnosis not present

## 2014-10-16 ENCOUNTER — Ambulatory Visit (INDEPENDENT_AMBULATORY_CARE_PROVIDER_SITE_OTHER): Payer: Medicare Other | Admitting: Physician Assistant

## 2014-10-16 ENCOUNTER — Encounter: Payer: Self-pay | Admitting: Physician Assistant

## 2014-10-16 VITALS — BP 108/50 | HR 68 | Ht 59.0 in | Wt 104.0 lb

## 2014-10-16 DIAGNOSIS — K625 Hemorrhage of anus and rectum: Secondary | ICD-10-CM | POA: Diagnosis not present

## 2014-10-16 DIAGNOSIS — K648 Other hemorrhoids: Secondary | ICD-10-CM

## 2014-10-16 DIAGNOSIS — K644 Residual hemorrhoidal skin tags: Secondary | ICD-10-CM

## 2014-10-16 MED ORDER — HYDROCORTISONE ACETATE 25 MG RE SUPP: 25.0000 mg | Freq: Two times a day (BID) | RECTAL | Status: AC

## 2014-10-16 NOTE — Progress Notes (Signed)
Reviewed and agree with management plan.  Eura Mccauslin T. Cache Bills, MD FACG 

## 2014-10-16 NOTE — Patient Instructions (Signed)
We sent a prescripiton to [facility] [address] for Anusol HC Suppositories. Get Miralax and take 1 capful in a glass of water daily.  Follow up with [place] as needed.

## 2014-10-16 NOTE — Progress Notes (Signed)
Patient ID: Jade [name], female   DOB: 11-01-1927, 81 y.o.   MRN: [id number]     History of Present Illness: Jade [name] is a pleasant 82 year old female who is known to Dr. Fuller Plan from. He is colonoscopy. Her most recent colonoscopy was in August 2012 at which time she had a pedunculated polyp at the hepatic flexure, diverticulosis, and internal hemorrhoids. The polyp was a tubular adenoma but no further screening colonoscopies were recommended due to her age. She was last seen in March 2015 with rectal bleeding was felt to be due to her internal hemorrhoids and a possible mild diverticular bleed. She has done well since then but states last week she had for 5 days of scant blood on the toilet tissue after bowel movements. This was accompanied with a sensation of incomplete evacuation and some rectal itching. She stopped eating her regular diet and began eating only hard-boiled eggs and toast. She is now having 2 or 3 small formed stools daily and is concerned because her stools are so small. She started using preparation H last week and within a few days all  bleeding resolved. She continues to have an intermittent sensation of incomplete evacuation. She has no abdominal pain and has had no fever or chills or night sweats.   Past Medical History  Diagnosis Date  . Bronchitis   . Allergic rhinitis   . Type 2 diabetes mellitus   . Hypertension   . Hypothyroid   . Glaucoma   . Internal hemorrhoids   . Diverticulosis 11/2010  . Tubular adenoma polyp of rectum 02/2002  . GERD (gastroesophageal reflux disease)   . Hyperlipidemia   . Cataract   . Diabetes mellitus     Past Surgical History  Procedure Laterality Date  . Tonsillectomy    . Breast cyst excision    . Total abdominal hysterectomy w/ bilateral salpingoophorectomy    . Cholecystectomy    . Appendectomy    . Hernia repair      w/ mesh  . Colonoscopy    . Polypectomy    . Diverticulitis  2004    SURGERY FOR  DIVERTICULITIS PER PT.   Family History  Problem Relation Age of Onset  . Lung cancer Father   . Heart disease Mother   . Colon cancer Neg Hx   . Heart disease Brother   . Diabetes Maternal Aunt   . Diabetes Maternal Uncle   . Diabetes Brother    History  Substance Use Topics  . Smoking status: Never Smoker   . Smokeless tobacco: Not on file  . Alcohol Use: No   Current Outpatient Prescriptions  Medication Sig Dispense Refill  . aspirin 81 MG tablet Take 81 mg by mouth daily.      Marland Kitchen atorvastatin (LIPITOR) 10 MG tablet Take 10 mg by mouth daily.     Marland Kitchen BETIMOL 0.5 % ophthalmic solution Place 1 drop into both eyes daily.     . Cholecalciferol (VITAMIN D) 1000 UNITS capsule Take 1,000 Units by mouth daily.      . Coenzyme Q10 (COQ10) 50 MG CAPS Take 1 capsule by mouth daily.      Marland Kitchen esomeprazole (NEXIUM) 40 MG capsule Take 40 mg by mouth daily.      Marland Kitchen estradiol (CLIMARA - DOSED IN MG/24 HR) 0.075 mg/24hr patch Place 0.075 mg onto the skin once a week.    . estradiol (VIVELLE-DOT) 0.075 MG/24HR as directed.      . fesoterodine (TOVIAZ)  4 MG TB24 Take 4 mg by mouth daily.      . fish oil-omega-3 fatty acids 1000 MG capsule Take 1 capsule by mouth daily.      . hydrocortisone (ANUSOL-HC) 25 MG suppository Place 1 suppository (25 mg total) rectally 2 (two) times daily. 20 suppository 0  . levothyroxine (SYNTHROID, LEVOTHROID) 75 MCG tablet Take 75 mcg by mouth daily.      Marland Kitchen losartan (COZAAR) 50 MG tablet Take 50 mg by mouth daily.      . meloxicam (MOBIC) 15 MG tablet Take 1 tablet (15 mg total) by mouth daily as needed for pain. 30 tablet 0  . metFORMIN (GLUCOPHAGE) 500 MG tablet Take 500 mg by mouth 2 (two) times daily with a meal.     . nitrofurantoin (MACRODANTIN) 100 MG capsule Take 100 mg by mouth daily.      Marland Kitchen triamterene-hydrochlorothiazide (MAXZIDE) 75-50 MG per tablet Take 0.5 tablets by mouth daily.      No current facility-administered medications for this visit.    Allergies  Allergen Reactions  . Codeine   . Pentazocine Lactate   . Tramadol Hcl       Review of Systems: Gen: Denies any fever, chills, sweats, anorexia, fatigue, weakness, malaise, weight loss, and sleep disorder CV: Denies chest pain, angina, palpitations, syncope, orthopnea, PND, peripheral edema, and claudication. Resp: Denies dyspnea at rest, dyspnea with exercise, cough, sputum, wheezing, coughing up blood, and pleurisy. GI: Denies vomiting blood, jaundice, and fecal incontinence.   Denies dysphagia or odynophagia. GU : Denies urinary burning, blood in urine, urinary frequency, urinary hesitancy, nocturnal urination, and urinary incontinence. MS: Denies joint pain, limitation of movement, and swelling, stiffness, low back pain, extremity pain. Denies muscle weakness, cramps, atrophy.  Derm: Denies rash, itching, dry skin, hives, moles, warts, or unhealing ulcers.  Psych: Denies depression, anxiety, memory loss, suicidal ideation, hallucinations, paranoia, and confusion. Heme: Denies bruising, bleeding, and enlarged lymph nodes. Neuro:  Denies any headaches, dizziness, paresthesia Endo:  Denies any problems with DM, thyroid, adrenal  LAB RESULTS: CBC on 09/04/2014 showed white count 6.3, hemoglobin 11.5, hematocrit 34.5, platelets 236,000. MCV 92.7.   Physical Exam: General: Pleasant, elderly, white female in no acute distress Head: Normocephalic and atraumatic Eyes:  sclerae anicteric, conjunctiva pink  Ears: Normal auditory acuity Lungs: Clear throughout to auscultation Heart: Regular rate and rhythm Abdomen: Soft, non distended, non-tender. No masses, no hepatomegaly. Normal bowel sounds Rectal: External skin tags and a small external hemorrhoid noted. Moderate sized nontender easily reducible internal hemorrhoid noted. DRE with no tenderness or masses. Brown stool heme negative. Musculoskeletal: Symmetrical with no gross deformities  Extremities: No edema   Neurological: Alert oriented x 4, grossly nonfocal Psychological:  Alert and cooperative. Normal mood and affect  Assessment and Recommendations: 81 year old female status post a recent episode of rectal bleeding which appears to be self-limited and has now resolved. Bleeding was described as scant streaking on the toilet tissue only and patient denies blood on the stool or blood dripping in the commode. Bleeding was likely hemorrhoidal in nature. Patient will use Anusol HC suppositories 1 per rectum twice a day for 10 days. Patient has been instructed to resume her regular diet and to avoid becoming constipated. She will try Mira lax one capful in water daily. She will follow up on a  prn basis.        Corianna Avallone, Deloris Ping 10/16/2014,

## 2014-10-22 ENCOUNTER — Telehealth: Payer: Self-pay | Admitting: Physician Assistant

## 2014-10-22 NOTE — Telephone Encounter (Signed)
Patient calling to report she is still having problems with having to push stool out. She reports she is taking Miralax daily and her stools are mushy. She reports she is still straining to get them out. She wants to know what Lori Hvozdovic, PA-C recommends.

## 2014-10-23 NOTE — Telephone Encounter (Signed)
Can try using benefiber 3 tablespoons daily. Schedule f/u with attending or APP in a few weeks.

## 2014-10-23 NOTE — Telephone Encounter (Signed)
Patient given recommendations. Scheduled OV with Lori Hvozdovic, PA-C on 11/06/14 at 8:45 AM.

## 2014-10-25 ENCOUNTER — Telehealth: Payer: Self-pay | Admitting: Physician Assistant

## 2014-10-25 NOTE — Telephone Encounter (Signed)
Patient notified of recommendations. 

## 2014-10-25 NOTE — Telephone Encounter (Signed)
Continue Benefiber Adequate daily water intake Increase Miralax to 2-3 times daily Call next week if not improved

## 2014-10-25 NOTE — Telephone Encounter (Signed)
Spoke with patient and she states she is still having soft stools in little pieces. She states she has to strain to get it out and feels she is not emptying.. She is using Benefiber 3 tablespoons daily. She also reports her stomach hurts. Please, advise.

## 2014-10-29 ENCOUNTER — Telehealth: Payer: Self-pay | Admitting: Physician Assistant

## 2014-10-29 NOTE — Telephone Encounter (Signed)
Spoke with patient and told her it is ok to hold Miralax for a day and see if her swelling in feet goes down. She will call her PCP that manages her "fluid pill" also.

## 2014-10-30 DIAGNOSIS — R609 Edema, unspecified: Secondary | ICD-10-CM | POA: Diagnosis not present

## 2014-11-06 ENCOUNTER — Encounter: Payer: Self-pay | Admitting: Physician Assistant

## 2014-11-06 ENCOUNTER — Ambulatory Visit (INDEPENDENT_AMBULATORY_CARE_PROVIDER_SITE_OTHER): Payer: Medicare Other | Admitting: Physician Assistant

## 2014-11-06 VITALS — BP 102/62 | HR 72 | Ht 59.0 in | Wt 104.8 lb

## 2014-11-06 DIAGNOSIS — K5900 Constipation, unspecified: Secondary | ICD-10-CM

## 2014-11-06 NOTE — Progress Notes (Signed)
Reviewed and agree with management plan. Colonoscopy 11/2010: 1 tubular adenoma, diverticulosis, internal hemorrhoids.   Pricilla Riffle. Fuller Plan, MD [facility]

## 2014-11-06 NOTE — Progress Notes (Signed)
Patient ID: Jade [name], female   DOB: February 28, 1928, 81 y.o.   MRN: [id number]     History of Present Illness: Jade [name] is a pleasant 81 year old female known to Dr. Fuller Plan. She was recently seen here in June with complaints of a sensation of incomplete evacuation and rectal bleeding. She also had some rectal itching. She was complaining of 2-3 small formed stools daily. At that time it was felt her bleeding was likely hemorrhoidal in nature and she was prescribed Anusol HC suppositories. She has had no rectal bleeding, pain, or itching since then. At her last visit she was also instructed to try Mira lax one capful daily. She feels it made her feet swell so she discontinued it. She is here today with ongoing complaints of 2-3 small formed stools each morning. She feels her stool should be larger. She is using Benefiber one or 2 tablespoons daily. Dietary review reveals that she eats minimal fiber on a daily basis. She does drink  plenty of water. She denies abdominal pain. Her appetite as been good. She has been using Colace once daily.   Past Medical History  Diagnosis Date  . Bronchitis   . Allergic rhinitis   . Type 2 diabetes mellitus   . Hypertension   . Hypothyroid   . Glaucoma   . Internal hemorrhoids   . Diverticulosis 11/2010  . Tubular adenoma polyp of rectum 02/2002  . GERD (gastroesophageal reflux disease)   . Hyperlipidemia   . Cataract   . Diabetes mellitus     Past Surgical History  Procedure Laterality Date  . Tonsillectomy    . Breast cyst excision    . Total abdominal hysterectomy w/ bilateral salpingoophorectomy    . Cholecystectomy    . Appendectomy    . Hernia repair      w/ mesh  . Colonoscopy    . Polypectomy    . Diverticulitis  2004    SURGERY FOR DIVERTICULITIS PER PT.   Family History  Problem Relation Age of Onset  . Lung cancer Father   . Heart disease Mother   . Colon cancer Neg Hx   . Heart disease Brother   . Diabetes Maternal  Aunt   . Diabetes Maternal Uncle   . Diabetes Brother    History  Substance Use Topics  . Smoking status: Never Smoker   . Smokeless tobacco: Not on file  . Alcohol Use: No   Current Outpatient Prescriptions  Medication Sig Dispense Refill  . aspirin 81 MG tablet Take 81 mg by mouth daily.      Marland Kitchen atorvastatin (LIPITOR) 10 MG tablet Take 10 mg by mouth daily.     Marland Kitchen BETIMOL 0.5 % ophthalmic solution Place 1 drop into both eyes daily.     . Cholecalciferol (VITAMIN D) 1000 UNITS capsule Take 1,000 Units by mouth daily.      . Coenzyme Q10 (COQ10) 50 MG CAPS Take 1 capsule by mouth daily.      Marland Kitchen esomeprazole (NEXIUM) 40 MG capsule Take 40 mg by mouth daily.      Marland Kitchen estradiol (CLIMARA - DOSED IN MG/24 HR) 0.075 mg/24hr patch Place 0.075 mg onto the skin once a week.    . estradiol (VIVELLE-DOT) 0.075 MG/24HR as directed.      . fesoterodine (TOVIAZ) 4 MG TB24 Take 4 mg by mouth daily.      . fish oil-omega-3 fatty acids 1000 MG capsule Take 1 capsule by mouth daily.      Marland Kitchen  FREESTYLE LITE test strip TEST QAM  11  . levothyroxine (SYNTHROID, LEVOTHROID) 75 MCG tablet Take 75 mcg by mouth daily.      Marland Kitchen losartan (COZAAR) 50 MG tablet Take 50 mg by mouth daily.      . meloxicam (MOBIC) 15 MG tablet Take 1 tablet (15 mg total) by mouth daily as needed for pain. 30 tablet 0  . metFORMIN (GLUCOPHAGE) 500 MG tablet Take 500 mg by mouth 2 (two) times daily with a meal.     . timolol (TIMOPTIC) 0.5 % ophthalmic solution INT 1 GTT INTO OU D  11  . triamterene-hydrochlorothiazide (MAXZIDE) 75-50 MG per tablet Take 0.5 tablets by mouth daily.      No current facility-administered medications for this visit.   Allergies  Allergen Reactions  . Codeine   . Miralax [Polyethylene Glycol] Swelling    Feet swelling  . Pentazocine Lactate   . Tramadol Hcl       Review of Systems: Gen: Denies any fever, chills, sweats, anorexia, fatigue, weakness, malaise, weight loss, and sleep disorder CV: Denies  chest pain, angina, palpitations, syncope, orthopnea, PND, peripheral edema, and claudication. Resp: Denies dyspnea at rest, dyspnea with exercise, cough, sputum, wheezing, coughing up blood, and pleurisy. GI: Denies vomiting blood, jaundice, and fecal incontinence.   Denies dysphagia or odynophagia. GU : Denies urinary burning, blood in urine, urinary frequency, urinary hesitancy, nocturnal urination, and urinary incontinence. MS: Denies joint pain, limitation of movement, and swelling, stiffness, low back pain, extremity pain. Denies muscle weakness, cramps, atrophy.  Derm: Denies rash, itching, dry skin, hives, moles, warts, or unhealing ulcers.  Psych: Denies depression, anxiety, memory loss, suicidal ideation, hallucinations, paranoia, and confusion. Heme: Denies bruising, bleeding, and enlarged lymph nodes. Neuro:  Denies any headaches, dizziness, paresthesia Endo:  Denies any problems with DM, thyroid, adrenal    Physical Exam: General: Pleasant, well developed female in no acute distress Head: Normocephalic and atraumatic Eyes:  sclerae anicteric, conjunctiva pink  Ears: Normal auditory acuity Lungs: Clear throughout to auscultation Heart: Regular rate and rhythm Abdomen: Soft, non distended, non-tender. No masses, no hepatomegaly. Normal bowel sounds Musculoskeletal: Symmetrical with no gross deformities  Extremities: No edema  Neurological: Alert oriented x 4, grossly nonfocal Psychological:  Alert and cooperative. Normal mood and affect  Assessment and Recommendations:  81 year old female here with complaints of small stools 2-3 times each morning. Patient feels she is not emptying completely. Patient has been instructed to increase her Benefiber to 1 heaping tablespoon twice daily. She will also try Senokot 1-2 tablets at bedtime (see patient states she does not want to use any prescription medications if possible). She has been instructed to call us in several weeks if she  does not note any improvement, otherwise she will follow-up in 3-4 months.       Kerri Kovacik, Deloris Ping 11/06/2014,

## 2014-11-06 NOTE — Patient Instructions (Addendum)
Please purchase the following medications over the counter and take as directed: Benefiber-take 1 tablespoon twice a day Stool Softener (Senokot 1-2 tab every night)  Please use "P" fruits daily: Peaches, Pears, Pineapple, Prunes  Please Follow up with Dr. Fuller Plan in 3 months

## 2014-11-26 DIAGNOSIS — I509 Heart failure, unspecified: Secondary | ICD-10-CM | POA: Diagnosis not present

## 2014-12-02 DIAGNOSIS — E119 Type 2 diabetes mellitus without complications: Secondary | ICD-10-CM | POA: Diagnosis not present

## 2014-12-02 DIAGNOSIS — Z961 Presence of intraocular lens: Secondary | ICD-10-CM | POA: Diagnosis not present

## 2014-12-02 DIAGNOSIS — H524 Presbyopia: Secondary | ICD-10-CM | POA: Diagnosis not present

## 2014-12-02 DIAGNOSIS — H4011X2 Primary open-angle glaucoma, moderate stage: Secondary | ICD-10-CM | POA: Diagnosis not present

## 2014-12-05 ENCOUNTER — Ambulatory Visit
Admission: RE | Admit: 2014-12-05 | Discharge: 2014-12-05 | Disposition: A | Discharge status: Home / Self Care | Payer: Medicare Other | Source: Ambulatory Visit | Attending: Internal Medicine | Admitting: Internal Medicine

## 2014-12-05 ENCOUNTER — Other Ambulatory Visit: Payer: Self-pay | Admitting: Internal Medicine

## 2014-12-05 DIAGNOSIS — R1030 Lower abdominal pain, unspecified: Secondary | ICD-10-CM

## 2014-12-05 DIAGNOSIS — R509 Fever, unspecified: Secondary | ICD-10-CM

## 2014-12-05 DIAGNOSIS — R110 Nausea: Secondary | ICD-10-CM | POA: Diagnosis not present

## 2014-12-05 DIAGNOSIS — K5732 Diverticulitis of large intestine without perforation or abscess without bleeding: Secondary | ICD-10-CM | POA: Diagnosis not present

## 2014-12-05 DIAGNOSIS — R1032 Left lower quadrant pain: Secondary | ICD-10-CM | POA: Diagnosis not present

## 2014-12-05 MED ORDER — IOPAMIDOL (ISOVUE-300) INJECTION 61%
60.0000 mL | Freq: Once | INTRAVENOUS | Status: AC | PRN
  Administered 2014-12-05: 60 mL via INTRAVENOUS

## 2014-12-09 ENCOUNTER — Other Ambulatory Visit (HOSPITAL_COMMUNITY): Payer: Self-pay | Admitting: Internal Medicine

## 2014-12-09 ENCOUNTER — Ambulatory Visit (HOSPITAL_COMMUNITY): Payer: Medicare Other | Attending: Cardiology

## 2014-12-09 ENCOUNTER — Other Ambulatory Visit: Payer: Self-pay | Admitting: Internal Medicine

## 2014-12-09 ENCOUNTER — Other Ambulatory Visit: Payer: Self-pay

## 2014-12-09 DIAGNOSIS — I509 Heart failure, unspecified: Secondary | ICD-10-CM

## 2014-12-24 DIAGNOSIS — I519 Heart disease, unspecified: Secondary | ICD-10-CM | POA: Diagnosis not present

## 2014-12-24 DIAGNOSIS — R5383 Other fatigue: Secondary | ICD-10-CM | POA: Diagnosis not present

## 2014-12-24 DIAGNOSIS — Z23 Encounter for immunization: Secondary | ICD-10-CM | POA: Diagnosis not present

## 2014-12-25 ENCOUNTER — Telehealth: Payer: Self-pay | Admitting: Physician Assistant

## 2014-12-25 MED ORDER — HYDROCORTISONE ACETATE 25 MG RE SUPP: RECTAL | Status: DC

## 2014-12-25 NOTE — Telephone Encounter (Signed)
Rx sent to pharmacy. She states she had stopped the Senokot because she had diverticulitis. She will restart it.

## 2014-12-25 NOTE — Telephone Encounter (Signed)
Spoke with patient and she is having problems with hemorrhoids again. She is having blood with stool. She reports only small  Soft stools and feels "full". Having difficulty sitting. She used a Prep H suppository last night without relief. Please, advise.

## 2014-12-25 NOTE — Telephone Encounter (Signed)
She can use Anusol HC suppositories 1 per rectum twice daily for 10 days. He she is using her Senokot as instructed at her last visit?

## 2014-12-25 NOTE — Telephone Encounter (Signed)
Left a message for patient to call back. 

## 2015-01-16 ENCOUNTER — Telehealth: Payer: Self-pay | Admitting: Physician Assistant

## 2015-01-16 NOTE — Telephone Encounter (Signed)
Left a message for patient to call back. 

## 2015-01-16 NOTE — Telephone Encounter (Signed)
Spoke with patient and told her Dr. Fuller Plan will order labs after visit and no banding is scheduled yet.

## 2015-01-22 ENCOUNTER — Ambulatory Visit (INDEPENDENT_AMBULATORY_CARE_PROVIDER_SITE_OTHER): Payer: Medicare Other | Admitting: Gastroenterology

## 2015-01-22 ENCOUNTER — Encounter: Payer: Self-pay | Admitting: Gastroenterology

## 2015-01-22 VITALS — BP 106/60 | HR 60 | Ht 59.0 in | Wt 102.0 lb

## 2015-01-22 DIAGNOSIS — K5900 Constipation, unspecified: Secondary | ICD-10-CM

## 2015-01-22 DIAGNOSIS — K5732 Diverticulitis of large intestine without perforation or abscess without bleeding: Secondary | ICD-10-CM

## 2015-01-22 DIAGNOSIS — R932 Abnormal findings on diagnostic imaging of liver and biliary tract: Secondary | ICD-10-CM | POA: Diagnosis not present

## 2015-01-22 NOTE — Patient Instructions (Addendum)
You can take over the counter Senakot for constipation.   You are scheduled for a Barium enema x-ray examination on 02/03/15 9:30am at [facility].   Be prepared to spend 2 to 2 1/2 hours at the Radiology Department  It is very important that you follow the instructions below.  Failure to follow these instructions may result in a study that is less than optimal and may lead to the necessity of repeating the examination.   Barium Enema Prep  Please purchase the following items from the laxative section of your drug store 10 oz.  Magnesium Citrate Bottle 4 Bisacodyl Tablets-Enteric coated 1 Bisacodyl Suppository   Day before exam  12:00 Lunch. This meal may include clear broth, white chicken meat sandwich (no butter, lettuce or other additives), or two hard boiled eggs, strained fruit juices, jello or gelatin ( not containing fruits or nuts).  Coffee or tea (without milk or cream), carbonated beverages.  1:00 pm Drink one full glass or more of water   3:00 pm Drink one full glass or more of water  5:00 pm  Supper.  It is very important that supper be limited to the items described here Broth, Clear jello/gelatin with no whole foods added (no red), soft drinks, gatorade, water black coffee or tea (no milk or cream), popsicles.   No red jello or popsicles.   7:00 pm drink one full glass of water  8:00 pm Drink the bottle of Magnesium Citrate (drink cold if preferred)  10:00 pm  Take three (3)Dulcolax tablets with one full glass or more of water.   Day of exam  NO breakfast, you may have coffee, tea (without milk or cream), or strained fruit juice 7:00 am unwrap the foil wrapper from bisacodyl suppository and discard the foil.  While lying on your side with thigh elevated, insert the suppository into the rectum and gently push in as far as possible.  Retain the suppository for at least 15 minutes, if possible, before evacuating, even if the urge is strong. Bowel evacuation  usually occurs within 15 to 60 minutes.  Patients requiring assistance should have a bed pan, commode or help readily available.   Report to the radiology department at [facility] on 02/03/15 at 9:30 am. Enter the hospital through the Outpatient Entrance on the [facility] side of the hospital   Thank you for choosing me and [facility].  Pricilla Riffle. Dagoberto Ligas., MD., Marval Regal  cc: Thressa Sheller, MD

## 2015-01-22 NOTE — Progress Notes (Addendum)
    History of Present Illness: This is an 81 year old female returning for follow-up of constipation and recent episode of diverticulitis. An abdominal pelvic CT scan was performed on 8/11 showing: IMPRESSION: 1. Acute diverticulitis at the junction of the distal descending and proximal sigmoid colon. No complicating features. 2. Slight prominence of the intrahepatic ducts. Correlate with LFTs. 3. Coronary artery calcifications.   She was treated with a course of cipro and flagyl and her pain completely resolved. She has the same ongoing complaints of difficulty evacuating stool and frequent needs to take laxitives. She takes Senokot intermittently with good results. She feels it is difficult to evacuate her stool. Colonoscopy performed in April 2012 revealed a small adenomatous polyp, internal hemorrhoids and left sided diverticulosis.   Current Medications, Allergies, Past Medical History, Past Surgical History, Family History and Social History were reviewed in [employer] record.  Physical Exam: General: Well developed , well nourished, no acute distress Head: Normocephalic and atraumatic Eyes:  sclerae anicteric, EOMI Ears: Normal auditory acuity Mouth: No deformity or lesions Lungs: Clear throughout to auscultation Heart: Regular rate and rhythm; no murmurs, rubs or bruits Abdomen: Soft, non tender and non distended. No masses, hepatosplenomegaly or hernias noted. Normal Bowel sounds Musculoskeletal: Symmetrical with no gross deformities  Pulses:  Normal pulses noted Extremities: No clubbing, cyanosis, edema or deformities noted Neurological: Alert oriented x 4, grossly nonfocal Psychological:  Alert and cooperative. Normal mood and affect  Assessment and Recommendations:  1. Constipation, difficult evacuation stools, recently treated diverticulitis. Discussed options of colonoscopy versus air-contrast barium enema. She prefers to proceed ACBE. Continue high  fiber diet with adequate daily water intake for constipation and her history of diverticulosis. Senakot as needed. Begin Kegel excercises.   2. Personal history of adenomatous colon polyps. Given her age there were no plans for routine surveillance colonoscopies. ACBE as above to evaluate for recurrent polyps.  3. Slightly prominent intrahepatic ducts on CT scan. Common bile duct was at upper limits or normal measuring 6.8 mm. No biliary symptoms. LFTs normal in 08/2014. Repeat LFTs.

## 2015-01-23 ENCOUNTER — Other Ambulatory Visit (INDEPENDENT_AMBULATORY_CARE_PROVIDER_SITE_OTHER): Payer: Medicare Other

## 2015-01-23 ENCOUNTER — Telehealth: Payer: Self-pay

## 2015-01-23 DIAGNOSIS — R938 Abnormal findings on diagnostic imaging of other specified body structures: Secondary | ICD-10-CM

## 2015-01-23 DIAGNOSIS — R9389 Abnormal findings on diagnostic imaging of other specified body structures: Secondary | ICD-10-CM

## 2015-01-23 LAB — HEPATIC FUNCTION PANEL
ALK PHOS: 47 U/L (ref 39–117)
ALT: 15 U/L (ref 0–35)
AST: 15 U/L (ref 0–37)
Albumin: 3.8 g/dL (ref 3.5–5.2)
BILIRUBIN TOTAL: 0.8 mg/dL (ref 0.2–1.2)
Bilirubin, Direct: 0.2 mg/dL (ref 0.0–0.3)
Total Protein: 7.2 g/dL (ref 6.0–8.3)

## 2015-01-23 NOTE — Telephone Encounter (Signed)
Per Dr. Fuller Plan patient needs repeat liver function tests. Called and left a message for patient to return my call.

## 2015-01-23 NOTE — Telephone Encounter (Signed)
Patient returned my call and I informed her that we need to repeat her LFT's since she had a abnormal CT scan in August. Pt states she will come by our lab today to get her lab work drawn.

## 2015-01-28 DIAGNOSIS — R600 Localized edema: Secondary | ICD-10-CM | POA: Diagnosis not present

## 2015-01-28 DIAGNOSIS — I129 Hypertensive chronic kidney disease with stage 1 through stage 4 chronic kidney disease, or unspecified chronic kidney disease: Secondary | ICD-10-CM | POA: Diagnosis not present

## 2015-02-03 ENCOUNTER — Ambulatory Visit (HOSPITAL_COMMUNITY)
Admission: RE | Admit: 2015-02-03 | Discharge: 2015-02-03 | Disposition: A | Discharge status: Home / Self Care | Payer: Medicare Other | Source: Ambulatory Visit | Attending: Gastroenterology | Admitting: Gastroenterology

## 2015-02-03 ENCOUNTER — Other Ambulatory Visit: Payer: Self-pay | Admitting: Gastroenterology

## 2015-02-03 ENCOUNTER — Telehealth: Payer: Self-pay

## 2015-02-03 DIAGNOSIS — K5900 Constipation, unspecified: Secondary | ICD-10-CM | POA: Diagnosis not present

## 2015-02-03 DIAGNOSIS — Z8719 Personal history of other diseases of the digestive system: Secondary | ICD-10-CM | POA: Diagnosis not present

## 2015-02-03 NOTE — Telephone Encounter (Signed)
Received a call from radiology that she had a scout film for BE and it showed a large amount of stool still in the colon.  ' She is rescheduled for 02/10/15 She is advised that she should take Miarlax  17 gm daily until Friday, TID on Saturday and the normal BE prep on 10/16.  She verbalized understanding of all instructions

## 2015-02-04 ENCOUNTER — Other Ambulatory Visit (HOSPITAL_COMMUNITY): Payer: Medicare Other

## 2015-02-10 ENCOUNTER — Other Ambulatory Visit (HOSPITAL_COMMUNITY): Payer: Medicare Other

## 2015-02-10 ENCOUNTER — Other Ambulatory Visit: Payer: Self-pay | Admitting: Gastroenterology

## 2015-02-10 ENCOUNTER — Ambulatory Visit (HOSPITAL_COMMUNITY)
Admission: RE | Admit: 2015-02-10 | Discharge: 2015-02-10 | Disposition: A | Discharge status: Home / Self Care | Payer: Medicare Other | Source: Ambulatory Visit | Attending: Gastroenterology | Admitting: Gastroenterology

## 2015-02-10 DIAGNOSIS — R55 Syncope and collapse: Secondary | ICD-10-CM | POA: Insufficient documentation

## 2015-02-10 DIAGNOSIS — K5730 Diverticulosis of large intestine without perforation or abscess without bleeding: Secondary | ICD-10-CM | POA: Diagnosis not present

## 2015-02-10 DIAGNOSIS — K5900 Constipation, unspecified: Secondary | ICD-10-CM

## 2015-02-10 DIAGNOSIS — K5790 Diverticulosis of intestine, part unspecified, without perforation or abscess without bleeding: Secondary | ICD-10-CM | POA: Diagnosis not present

## 2015-02-21 DIAGNOSIS — Z961 Presence of intraocular lens: Secondary | ICD-10-CM | POA: Diagnosis not present

## 2015-03-04 DIAGNOSIS — M8580 Other specified disorders of bone density and structure, unspecified site: Secondary | ICD-10-CM | POA: Diagnosis not present

## 2015-03-04 DIAGNOSIS — I10 Essential (primary) hypertension: Secondary | ICD-10-CM | POA: Diagnosis not present

## 2015-03-04 DIAGNOSIS — Z0000 Encounter for general adult medical examination without abnormal findings: Secondary | ICD-10-CM | POA: Diagnosis not present

## 2015-03-04 DIAGNOSIS — E1122 Type 2 diabetes mellitus with diabetic chronic kidney disease: Secondary | ICD-10-CM | POA: Diagnosis not present

## 2015-03-04 DIAGNOSIS — M859 Disorder of bone density and structure, unspecified: Secondary | ICD-10-CM | POA: Diagnosis not present

## 2015-03-04 DIAGNOSIS — E441 Mild protein-calorie malnutrition: Secondary | ICD-10-CM | POA: Diagnosis not present

## 2015-03-04 DIAGNOSIS — N390 Urinary tract infection, site not specified: Secondary | ICD-10-CM | POA: Diagnosis not present

## 2015-03-04 DIAGNOSIS — E1165 Type 2 diabetes mellitus with hyperglycemia: Secondary | ICD-10-CM | POA: Diagnosis not present

## 2015-03-11 DIAGNOSIS — E1122 Type 2 diabetes mellitus with diabetic chronic kidney disease: Secondary | ICD-10-CM | POA: Diagnosis not present

## 2015-03-11 DIAGNOSIS — I5032 Chronic diastolic (congestive) heart failure: Secondary | ICD-10-CM | POA: Diagnosis not present

## 2015-03-11 DIAGNOSIS — N183 Chronic kidney disease, stage 3 (moderate): Secondary | ICD-10-CM | POA: Diagnosis not present

## 2015-03-11 DIAGNOSIS — E441 Mild protein-calorie malnutrition: Secondary | ICD-10-CM | POA: Diagnosis not present

## 2015-05-08 ENCOUNTER — Emergency Department (HOSPITAL_COMMUNITY): Payer: Medicare Other

## 2015-05-08 ENCOUNTER — Emergency Department (HOSPITAL_COMMUNITY)
Admission: EM | Admit: 2015-05-08 | Discharge: 2015-05-08 | Disposition: A | Discharge status: Home / Self Care | Payer: Medicare Other | Attending: Emergency Medicine | Admitting: Emergency Medicine

## 2015-05-08 ENCOUNTER — Encounter (HOSPITAL_COMMUNITY): Payer: Self-pay | Admitting: Emergency Medicine

## 2015-05-08 DIAGNOSIS — H409 Unspecified glaucoma: Secondary | ICD-10-CM | POA: Diagnosis not present

## 2015-05-08 DIAGNOSIS — E785 Hyperlipidemia, unspecified: Secondary | ICD-10-CM | POA: Diagnosis not present

## 2015-05-08 DIAGNOSIS — Y92000 Kitchen of unspecified non-institutional (private) residence as  the place of occurrence of the external cause: Secondary | ICD-10-CM | POA: Insufficient documentation

## 2015-05-08 DIAGNOSIS — S8001XA Contusion of right knee, initial encounter: Secondary | ICD-10-CM

## 2015-05-08 DIAGNOSIS — Z86018 Personal history of other benign neoplasm: Secondary | ICD-10-CM | POA: Insufficient documentation

## 2015-05-08 DIAGNOSIS — W01198A Fall on same level from slipping, tripping and stumbling with subsequent striking against other object, initial encounter: Secondary | ICD-10-CM | POA: Diagnosis not present

## 2015-05-08 DIAGNOSIS — E039 Hypothyroidism, unspecified: Secondary | ICD-10-CM | POA: Diagnosis not present

## 2015-05-08 DIAGNOSIS — Z79899 Other long term (current) drug therapy: Secondary | ICD-10-CM | POA: Diagnosis not present

## 2015-05-08 DIAGNOSIS — Y998 Other external cause status: Secondary | ICD-10-CM | POA: Diagnosis not present

## 2015-05-08 DIAGNOSIS — S0083XA Contusion of other part of head, initial encounter: Secondary | ICD-10-CM | POA: Diagnosis not present

## 2015-05-08 DIAGNOSIS — Z8719 Personal history of other diseases of the digestive system: Secondary | ICD-10-CM | POA: Diagnosis not present

## 2015-05-08 DIAGNOSIS — Z7982 Long term (current) use of aspirin: Secondary | ICD-10-CM | POA: Diagnosis not present

## 2015-05-08 DIAGNOSIS — I10 Essential (primary) hypertension: Secondary | ICD-10-CM | POA: Insufficient documentation

## 2015-05-08 DIAGNOSIS — H269 Unspecified cataract: Secondary | ICD-10-CM | POA: Insufficient documentation

## 2015-05-08 DIAGNOSIS — E119 Type 2 diabetes mellitus without complications: Secondary | ICD-10-CM | POA: Diagnosis not present

## 2015-05-08 DIAGNOSIS — Z8709 Personal history of other diseases of the respiratory system: Secondary | ICD-10-CM | POA: Insufficient documentation

## 2015-05-08 DIAGNOSIS — Y9389 Activity, other specified: Secondary | ICD-10-CM | POA: Insufficient documentation

## 2015-05-08 DIAGNOSIS — S199XXA Unspecified injury of neck, initial encounter: Secondary | ICD-10-CM | POA: Diagnosis not present

## 2015-05-08 DIAGNOSIS — W010XXA Fall on same level from slipping, tripping and stumbling without subsequent striking against object, initial encounter: Secondary | ICD-10-CM

## 2015-05-08 DIAGNOSIS — S0990XA Unspecified injury of head, initial encounter: Secondary | ICD-10-CM | POA: Diagnosis present

## 2015-05-08 NOTE — ED Notes (Signed)
Pt was stepping over a metal bar in the kitchen and tripped falling to right knee and hitting her head on a cabinet. Denies LOC or any neuro changes. Pt ambulatory and answering questions appropriately. Pt has right knee pain and reports some discomfort to her neck.

## 2015-05-08 NOTE — ED Provider Notes (Signed)
CSN: [id number]     Arrival date & time 05/08/15  1654 History  By signing my name below, I, Altamease Oiler, attest that this documentation has been prepared under the direction and in the presence of Delora Fuel, MD. Electronically Signed: Altamease Oiler, ED Scribe. 05/08/2015. 11:26 PM   No chief complaint on file.  The history is provided by the patient and a relative. No language interpreter was used.   Jade [name] is a 82 y.o. female who presents to the Emergency Department complaining of a fall this afternoon at home. Pt was stepping over a metal bar in her kitchen and tripped falling to right knee and hitting her head on a cabinet. Associated symptoms include an area of swelling and bruising at the forehead, 7/10 in severity right knee pain, and neck pain. Pt denies LOC, headache, nausea, vomiting, difficulty with balance. She is not on a blood thinner apart from aspirin. Her PCP is Dr. Noah Delaine.   Past Medical History  Diagnosis Date  . Bronchitis   . Allergic rhinitis   . Type 2 diabetes mellitus ([place])   . Hypertension   . Hypothyroid   . Glaucoma   . Internal hemorrhoids   . Diverticulosis 11/2010  . Tubular adenoma polyp of rectum 02/2002  . GERD (gastroesophageal reflux disease)   . Hyperlipidemia   . Cataract   . Diabetes mellitus    Past Surgical History  Procedure Laterality Date  . Tonsillectomy    . Breast cyst excision    . Total abdominal hysterectomy w/ bilateral salpingoophorectomy    . Cholecystectomy    . Appendectomy    . Hernia repair      w/ mesh  . Colonoscopy    . Polypectomy    . Diverticulitis  2004    SURGERY FOR DIVERTICULITIS PER PT.   Family History  Problem Relation Age of Onset  . Lung cancer Father   . Heart disease Mother   . Colon cancer Neg Hx   . Heart disease Brother   . Diabetes Maternal Aunt   . Diabetes Maternal Uncle   . Diabetes Brother    Social History  Substance Use Topics  . Smoking status: Never Smoker    . Smokeless tobacco: None  . Alcohol Use: No   OB History    No data available     Review of Systems  Gastrointestinal: Negative for nausea and vomiting.  Musculoskeletal: Positive for arthralgias (right knee) and neck pain. Negative for gait problem.  Skin: Positive for color change. Negative for wound.       An area of swelling and bruising at the forehead  Neurological: Negative for dizziness, weakness, numbness and headaches.   Allergies  Codeine; Miralax; Pentazocine lactate; and Tramadol hcl  Home Medications   Prior to Admission medications   Medication Sig Start Date End Date Taking? Authorizing Provider  aspirin 81 MG tablet Take 81 mg by mouth daily.      Historical Provider, MD  atorvastatin (LIPITOR) 10 MG tablet Take 10 mg by mouth daily.  12/15/10   Historical Provider, MD  BETIMOL 0.5 % ophthalmic solution Place 1 drop into both eyes daily.  11/18/10   Historical Provider, MD  Cholecalciferol (VITAMIN D) 1000 UNITS capsule Take 1,000 Units by mouth daily.      Historical Provider, MD  Coenzyme Q10 (COQ10) 50 MG CAPS Take 1 capsule by mouth daily.      Historical Provider, MD  estradiol (CLIMARA - DOSED IN  MG/24 HR) 0.075 mg/24hr patch Place 0.075 mg onto the skin once a week.    Historical Provider, MD  fesoterodine (TOVIAZ) 4 MG TB24 Take 4 mg by mouth daily.      Historical Provider, MD  fish oil-omega-3 fatty acids 1000 MG capsule Take 1 capsule by mouth daily.      Historical Provider, MD  FREESTYLE LITE test strip TEST QAM 09/13/14   Historical Provider, MD  hydrocortisone (ANUSOL-HC) 25 MG suppository Insert one rectally BID x 10 days 12/25/14   Lori P Hvozdovic, PA-C  levothyroxine (SYNTHROID, LEVOTHROID) 75 MCG tablet Take 75 mcg by mouth daily.      Historical Provider, MD  losartan (COZAAR) 50 MG tablet Take 50 mg by mouth daily.      Historical Provider, MD  meloxicam (MOBIC) 15 MG tablet Take 1 tablet (15 mg total) by mouth daily as needed for pain. 03/19/12    Shawnee Knapp, MD  metFORMIN (GLUCOPHAGE) 500 MG tablet Take 500 mg by mouth 2 (two) times daily with a meal.     Historical Provider, MD  timolol (TIMOPTIC) 0.5 % ophthalmic solution INT 1 GTT INTO OU D 08/26/14   Historical Provider, MD  triamterene-hydrochlorothiazide (MAXZIDE) 75-50 MG per tablet Take 0.5 tablets by mouth daily.     Historical Provider, MD   BP 169/70 mmHg  Pulse 80  Temp(Src) 98 F (36.7 C) (Oral)  SpO2 95% Physical Exam  Constitutional: She is oriented to person, place, and time. She appears well-developed and well-nourished. No distress.  HENT:  Head: Normocephalic.  Ecchymosis and hematoma of the central forehead.   Eyes: EOM are normal. Pupils are equal, round, and reactive to light.  Neck: Normal range of motion. Neck supple. No JVD present.  Cardiovascular: Normal rate, regular rhythm and normal heart sounds.   No murmur heard. Pulmonary/Chest: Effort normal and breath sounds normal. She has no wheezes. She has no rales. She exhibits no tenderness.  Abdominal: Soft. Bowel sounds are normal. She exhibits no distension and no mass. There is no tenderness.  Musculoskeletal: Normal range of motion. She exhibits tenderness. She exhibits no edema.  Mild TTP of the anterior aspect of the right knee. No effusion. No instability.   Lymphadenopathy:    She has no cervical adenopathy.  Neurological: She is alert and oriented to person, place, and time. No cranial nerve deficit. She exhibits normal muscle tone. Coordination normal.  Skin: Skin is warm and dry. No rash noted.  Psychiatric: She has a normal mood and affect. Her behavior is normal. Judgment and thought content normal.  Nursing note and vitals reviewed.   ED Course  Procedures (including critical care time) DIAGNOSTIC STUDIES: Oxygen Saturation is 95% on RA,  normal by my interpretation.    COORDINATION OF CARE: 11:16 PM Discussed treatment plan which includes CT head without contrast, CT cervical spine  without contrast, and XR of the right knee with pt at bedside and pt agreed to plan.  Labs Review Labs Reviewed - No data to display  Imaging Review Ct Head Wo Contrast  05/08/2015  CLINICAL DATA:  Injury after fall. EXAM: CT HEAD WITHOUT CONTRAST CT CERVICAL SPINE WITHOUT CONTRAST TECHNIQUE: Multidetector CT imaging of the head and cervical spine was performed following the standard protocol without intravenous contrast. Multiplanar CT image reconstructions of the cervical spine were also generated. COMPARISON:  CT scan of head of February 28, 2013. FINDINGS: CT HEAD FINDINGS Bony calvarium appears intact. Mild left frontal scalp hematoma  is noted. Mild diffuse cortical atrophy is noted. Mild chronic ischemic white matter disease is noted. No mass effect or midline shift is noted. Ventricular size is within normal limits. There is no evidence of mass lesion, hemorrhage or acute infarction. CT CERVICAL SPINE FINDINGS No fracture or spondylolisthesis is noted. Severe degenerative disc disease is noted at C2-3 and C3-4 with anterior osteophyte formation. There is fusion of the vertebral bodies of C4, C5, C6 and C7, most likely degenerative in origin. Visualized lung apices are unremarkable. IMPRESSION: Mild left frontal scalp hematoma. Mild diffuse cortical atrophy. Mild chronic ischemic white matter disease. No acute intracranial abnormality seen. Severe multilevel degenerative changes are noted. No acute abnormality seen in the cervical spine. Electronically Signed   By: Marijo Conception, M.D.   On: 05/08/2015 21:38   Ct Cervical Spine Wo Contrast  05/08/2015  CLINICAL DATA:  Injury after fall. EXAM: CT HEAD WITHOUT CONTRAST CT CERVICAL SPINE WITHOUT CONTRAST TECHNIQUE: Multidetector CT imaging of the head and cervical spine was performed following the standard protocol without intravenous contrast. Multiplanar CT image reconstructions of the cervical spine were also generated. COMPARISON:  CT scan of head  of February 28, 2013. FINDINGS: CT HEAD FINDINGS Bony calvarium appears intact. Mild left frontal scalp hematoma is noted. Mild diffuse cortical atrophy is noted. Mild chronic ischemic white matter disease is noted. No mass effect or midline shift is noted. Ventricular size is within normal limits. There is no evidence of mass lesion, hemorrhage or acute infarction. CT CERVICAL SPINE FINDINGS No fracture or spondylolisthesis is noted. Severe degenerative disc disease is noted at C2-3 and C3-4 with anterior osteophyte formation. There is fusion of the vertebral bodies of C4, C5, C6 and C7, most likely degenerative in origin. Visualized lung apices are unremarkable. IMPRESSION: Mild left frontal scalp hematoma. Mild diffuse cortical atrophy. Mild chronic ischemic white matter disease. No acute intracranial abnormality seen. Severe multilevel degenerative changes are noted. No acute abnormality seen in the cervical spine. Electronically Signed   By: Marijo Conception, M.D.   On: 05/08/2015 21:38   Dg Knee Complete 4 Views Right  05/08/2015  CLINICAL DATA:  Trip and fall today at home landing on right knee. Now with right anterior knee pain. EXAM: RIGHT KNEE - COMPLETE 4+ VIEW COMPARISON:  09/27/2009 FINDINGS: No acute fracture or dislocation. Advanced medial tibial femoral osteoarthritis with joint space narrowing and osteophytes. Lateral tibial femoral and patellofemoral spurring. There is chondrocalcinosis. Quadriceps tendon enthesophyte. No joint effusion. Calcifications posteriorly are likely capsular and avascular. IMPRESSION: 1. No acute fracture dislocation of the right knee. 2. 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 Electronically Signed   By: Jeb Levering M.D.   On: 05/08/2015 21:00   I have personally reviewed and evaluated these images and lab results as part of my medical decision-making.   MDM   Final diagnoses:  Fall  from slip, trip, or stumble, initial encounter  Traumatic hematoma of forehead, initial encounter  Contusion of right knee, initial encounter   Fall at home with forehead contusion and hematoma and contusion of the right knee. No loss of consciousness. She sent for CT of head and cervical spine as well as x-rays of the right knee all of which are negative for acute injury other than scalp hematoma. She is discharged home with contusion instructions and told to use over-the-counter analgesics as needed.  I personally performed the services described in this documentation,  which was scribed in my presence. The recorded information has been reviewed and is accurate.     Delora Fuel, MD [phone]

## 2015-05-08 NOTE — Discharge Instructions (Signed)
Take acetaminophen and/or ibuprofen as needed for pain.  Fall Prevention in the Home  Falls can cause injuries and can affect people from all age groups. There are many simple things that you can do to make your home safe and to help prevent falls. WHAT CAN I DO ON THE OUTSIDE OF MY HOME?  Regularly repair the edges of walkways and driveways and fix any cracks.  Remove high doorway thresholds.  Trim any shrubbery on the main path into your home.  Use bright outdoor lighting.  Clear walkways of debris and clutter, including tools and rocks.  Regularly check that handrails are securely fastened and in good repair. Both sides of any steps should have handrails.  Install guardrails along the edges of any raised decks or porches.  Have leaves, snow, and ice cleared regularly.  Use sand or salt on walkways during winter months.  In the garage, clean up any spills right away, including grease or oil spills. WHAT CAN I DO IN THE BATHROOM?  Use night lights.  Install grab bars by the toilet and in the tub and shower. Do not use towel bars as grab bars.  Use non-skid mats or decals on the floor of the tub or shower.  If you need to sit down while you are in the shower, use a plastic, non-slip stool.Marland Kitchen  Keep the floor dry. Immediately clean up any water that spills on the floor.  Remove soap buildup in the tub or shower on a regular basis.  Attach bath mats securely with double-sided non-slip rug tape.  Remove throw rugs and other tripping hazards from the floor. WHAT CAN I DO IN THE BEDROOM?  Use night lights.  Make sure that a bedside light is easy to reach.  Do not use oversized bedding that drapes onto the floor.  Have a firm chair that has side arms to use for getting dressed.  Remove throw rugs and other tripping hazards from the floor. WHAT CAN I DO IN THE KITCHEN?   Clean up any spills right away.  Avoid walking on wet floors.  Place frequently used items in  easy-to-reach places.  If you need to reach for something above you, use a sturdy step stool that has a grab bar.  Keep electrical cables out of the way.  Do not use floor polish or wax that makes floors slippery. If you have to use wax, make sure that it is non-skid floor wax.  Remove throw rugs and other tripping hazards from the floor. WHAT CAN I DO IN THE STAIRWAYS?  Do not leave any items on the stairs.  Make sure that there are handrails on both sides of the stairs. Fix handrails that are broken or loose. Make sure that handrails are as long as the stairways.  Check any carpeting to make sure that it is firmly attached to the stairs. Fix any carpet that is loose or worn.  Avoid having throw rugs at the top or bottom of stairways, or secure the rugs with carpet tape to prevent them from moving.  Make sure that you have a light switch at the top of the stairs and the bottom of the stairs. If you do not have them, have them installed. WHAT ARE SOME OTHER FALL PREVENTION TIPS?  Wear closed-toe shoes that fit well and support your feet. Wear shoes that have rubber soles or low heels.  When you use a stepladder, make sure that it is completely opened and that the sides are  firmly locked. Have someone hold the ladder while you are using it. Do not climb a closed stepladder.  Add color or contrast paint or tape to grab bars and handrails in your home. Place contrasting color strips on the first and last steps.  Use mobility aids as needed, such as canes, walkers, scooters, and crutches.  Turn on lights if it is dark. Replace any light bulbs that burn out.  Set up furniture so that there are clear paths. Keep the furniture in the same spot.  Fix any uneven floor surfaces.  Choose a carpet design that does not hide the edge of steps of a stairway.  Be aware of any and all pets.  Review your medicines with your healthcare provider. Some medicines can cause dizziness or changes in  blood pressure, which increase your risk of falling. Talk with your health care provider about other ways that you can decrease your risk of falls. This may include working with a physical therapist or trainer to improve your strength, balance, and endurance.   This information is not intended to replace advice given to you by your health care provider. Make sure you discuss any questions you have with your health care provider.   Document Released: 04/02/2002 Document Revised: 08/27/2014 Document Reviewed: 05/17/2014 Elsevier Interactive Patient Education 2016 [place] A contusion is a deep bruise. Contusions are the result of a blunt injury to tissues and muscle fibers under the skin. The injury causes bleeding under the skin. The skin overlying the contusion may turn blue, purple, or yellow. Minor injuries will give you a painless contusion, but more severe contusions may stay painful and swollen for a few weeks.  CAUSES  This condition is usually caused by a blow, trauma, or direct force to an area of the body. SYMPTOMS  Symptoms of this condition include:  Swelling of the injured area.  Pain and tenderness in the injured area.  Discoloration. The area may have redness and then turn blue, purple, or yellow. DIAGNOSIS  This condition is diagnosed based on a physical exam and medical history. An X-ray, CT scan, or MRI may be needed to determine if there are any associated injuries, such as broken bones (fractures). TREATMENT  Specific treatment for this condition depends on what area of the body was injured. In general, the best treatment for a contusion is resting, icing, applying pressure to (compression), and elevating the injured area. This is often called the RICE strategy. Over-the-counter anti-inflammatory medicines may also be recommended for pain control.  HOME CARE INSTRUCTIONS   Rest the injured area.  If directed, apply ice to the injured area:  Put ice  in a plastic bag.  Place a towel between your skin and the bag.  Leave the ice on for 20 minutes, 2-3 times per day.  If directed, apply light compression to the injured area using an elastic bandage. Make sure the bandage is not wrapped too tightly. Remove and reapply the bandage as directed by your health care provider.  If possible, raise (elevate) the injured area above the level of your heart while you are sitting or lying down.  Take over-the-counter and prescription medicines only as told by your health care provider. SEEK MEDICAL CARE IF:  Your symptoms do not improve after several days of treatment.  Your symptoms get worse.  You have difficulty moving the injured area. SEEK IMMEDIATE MEDICAL CARE IF:   You have severe pain.  You have numbness in a  hand or foot.  Your hand or foot turns pale or cold.   This information is not intended to replace advice given to you by your health care provider. Make sure you discuss any questions you have with your health care provider.   Document Released: 01/20/2005 Document Revised: 01/01/2015 Document Reviewed: 08/28/2014 Elsevier Interactive Patient Education [employer].

## 2015-09-02 DIAGNOSIS — E785 Hyperlipidemia, unspecified: Secondary | ICD-10-CM | POA: Diagnosis not present

## 2015-09-02 DIAGNOSIS — E1122 Type 2 diabetes mellitus with diabetic chronic kidney disease: Secondary | ICD-10-CM | POA: Diagnosis not present

## 2015-09-02 DIAGNOSIS — M8580 Other specified disorders of bone density and structure, unspecified site: Secondary | ICD-10-CM | POA: Diagnosis not present

## 2015-09-02 DIAGNOSIS — E559 Vitamin D deficiency, unspecified: Secondary | ICD-10-CM | POA: Diagnosis not present

## 2015-09-02 DIAGNOSIS — I5032 Chronic diastolic (congestive) heart failure: Secondary | ICD-10-CM | POA: Diagnosis not present

## 2017-01-23 IMAGING — CT CT HEAD W/O CM
4 of 6 series · 15 of 47 positions shown, 16 images · non-contrast
Comparison: CT scan of head February 28, 2013.

CLINICAL DATA: Injury after fall.

EXAM:
CT HEAD WITHOUT CONTRAST
CT CERVICAL SPINE WITHOUT CONTRAST
TECHNIQUE: Multidetector CT imaging of the head and cervical spine was
performed following the standard protocol without intravenous
contrast. Multiplanar CT image reconstructions of the cervical spine
were also generated.

[Series 3: head w/o · axial · non-contrast · 0.39mm/px · z∈[-102,-52]mm · 2 of 30 slices shown, 3 images]
[im 10/30  brain]
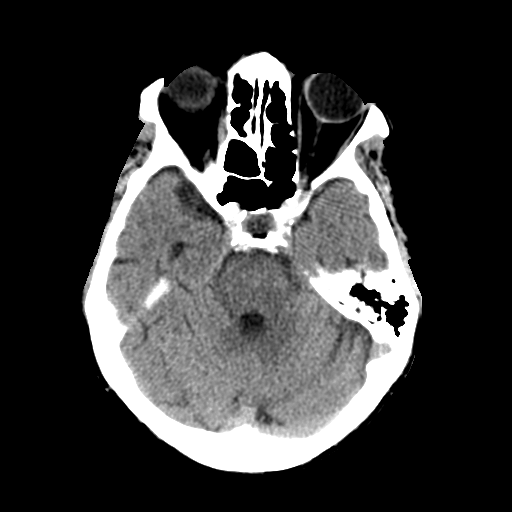
[im 10/30  bone]
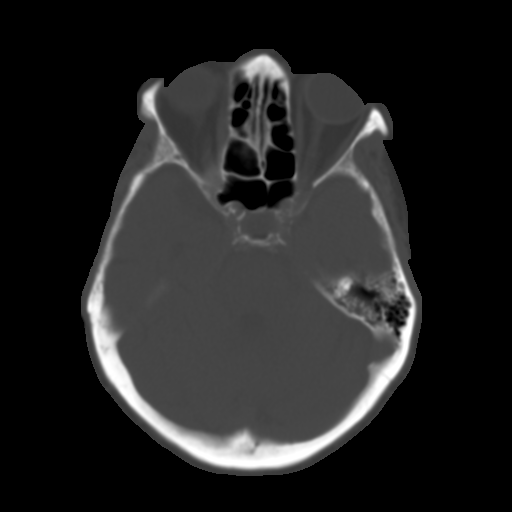
[im 20/30  brain]
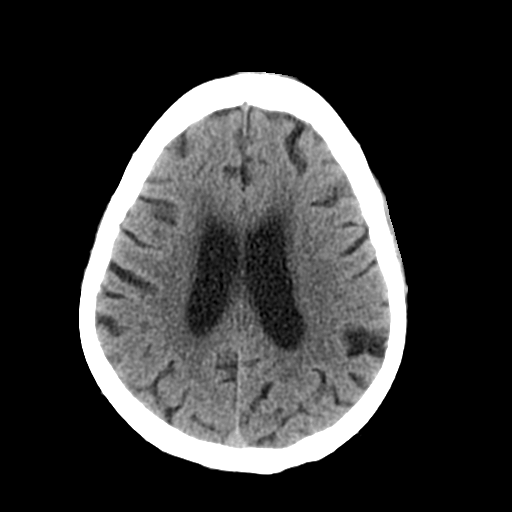

[Series 8: axial reformats · axial · 0.21mm/px · z∈[-279,-190]mm · 7 of 80 slices shown]
[im 9/80  brain]
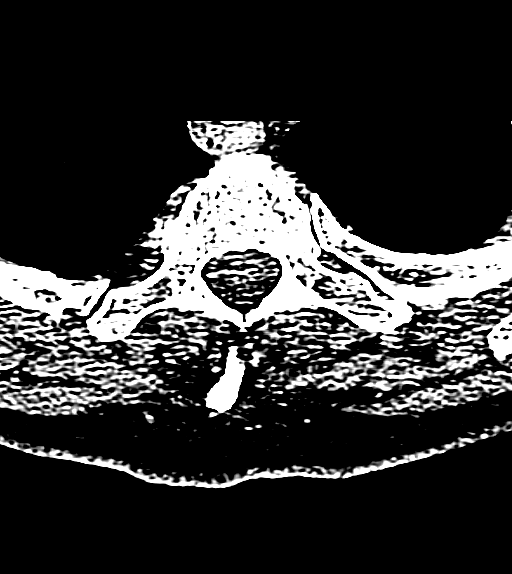
[im 18/80  brain]
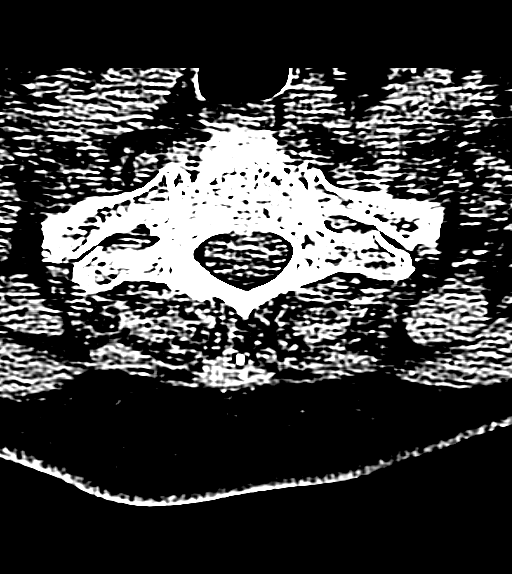
[im 27/80  brain]
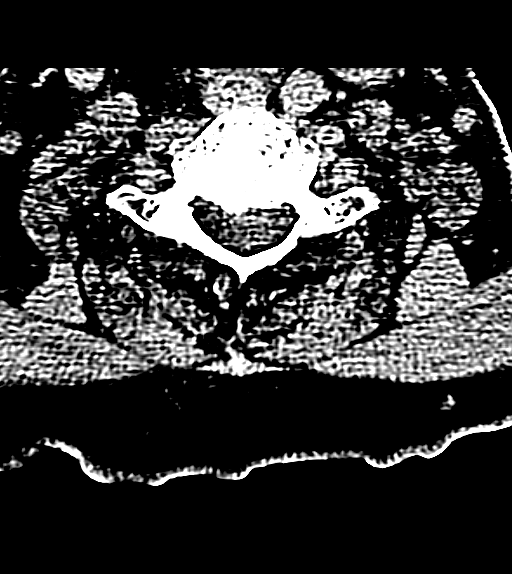
[im 36/80  brain]
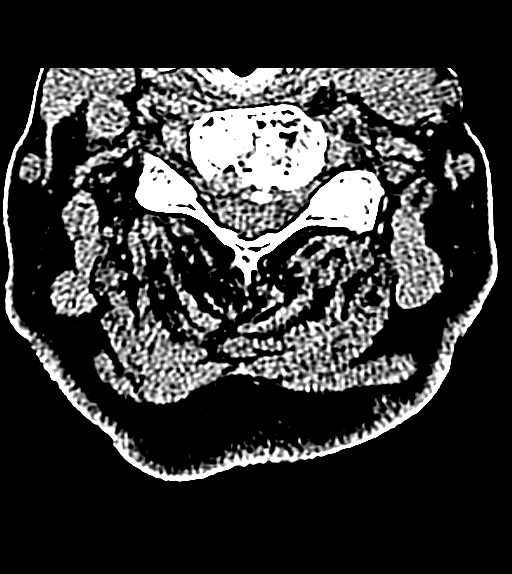
[im 44/80  brain]
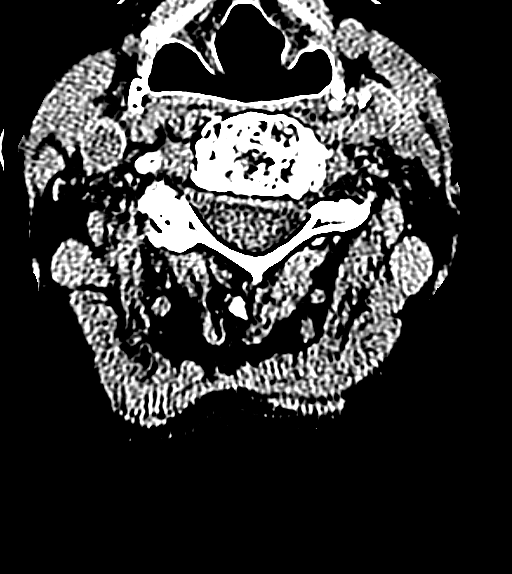
[im 53/80  brain]
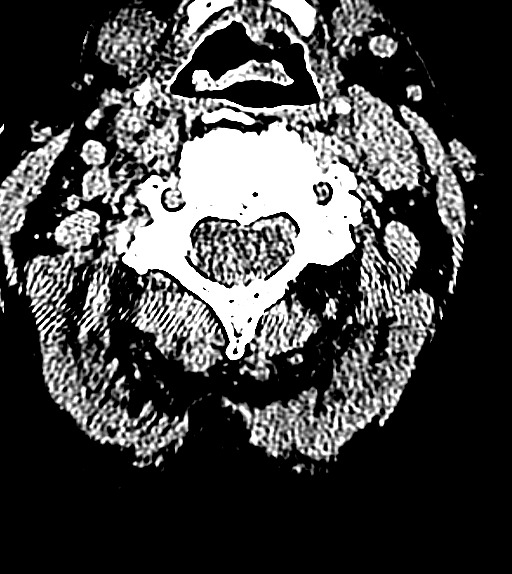
[im 62/80  brain]
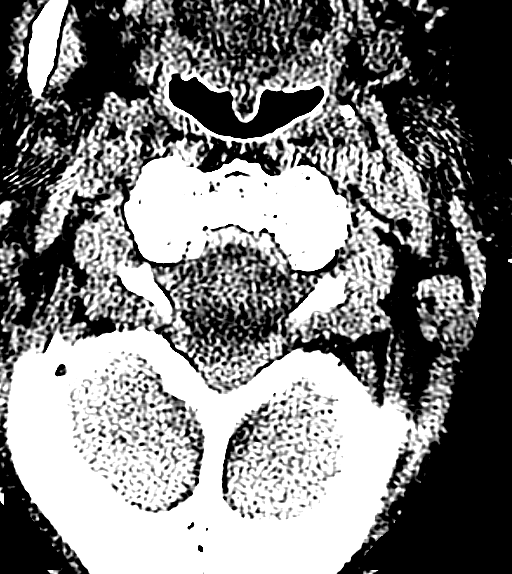

[Series 9: coronal recons · coronal · 0.24mm/px · 3 of 44 slices shown]
[im 15/44  brain]
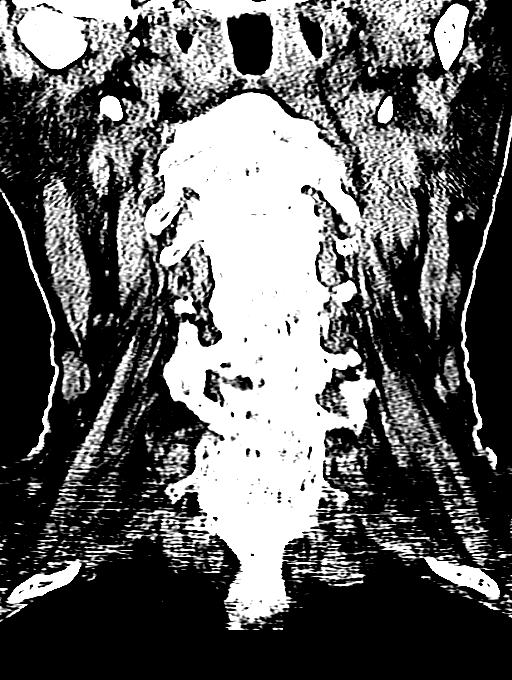
[im 20/44  brain]
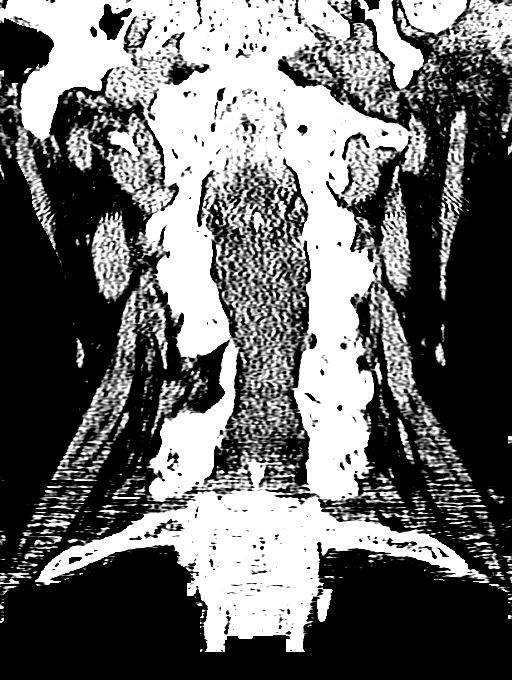
[im 24/44  brain]
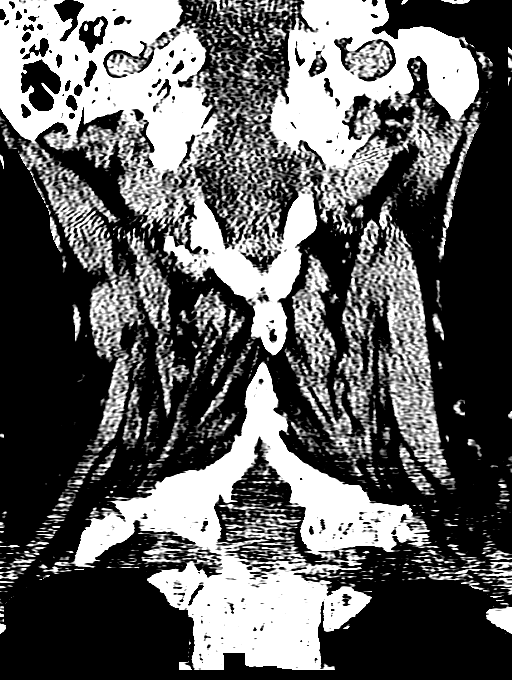

[Series 10: sagittal recons · sagittal · 0.20mm/px · 3 of 61 slices shown]
[im 21/61  brain]
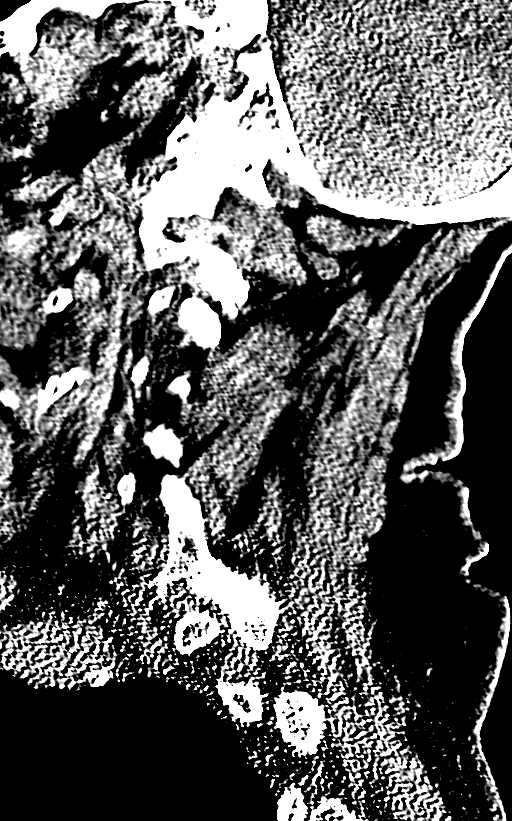
[im 31/61  brain]
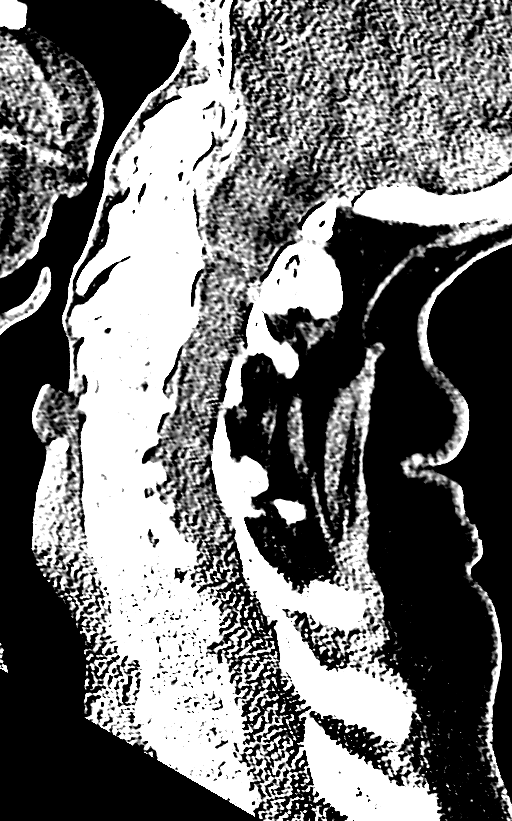
[im 41/61  brain]
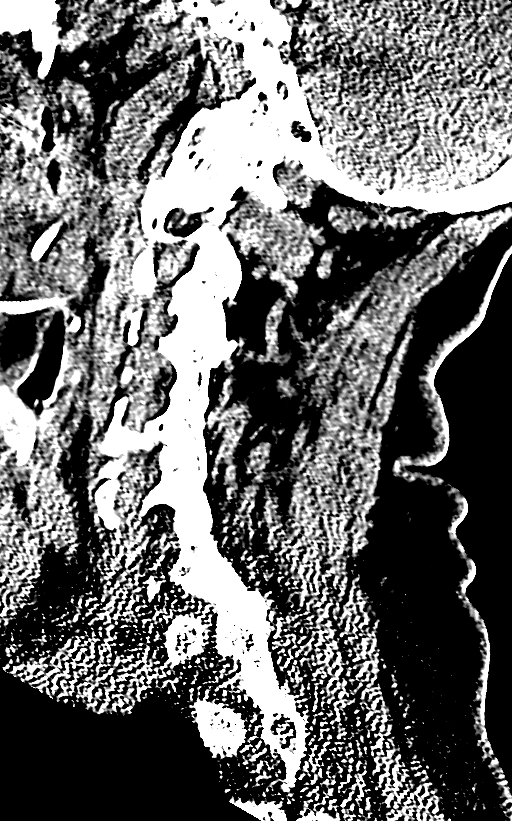

[15 of 47 positions shown; findings below may reference images not displayed]

FINDINGS: CT HEAD FINDINGS

Bony calvarium appears intact. Mild left frontal scalp hematoma is
noted. Mild diffuse cortical atrophy is noted. Mild chronic ischemic
white matter disease is noted. No mass effect or midline shift is
noted. Ventricular size is within normal limits. There is no
evidence of mass lesion, hemorrhage or acute infarction.

CT CERVICAL SPINE FINDINGS

No fracture or spondylolisthesis is noted. Severe degenerative disc
disease is noted at C2-3 and C3-4 with anterior osteophyte
formation. There is fusion of the vertebral bodies of C4, C5, C6 and
C7, most likely degenerative in origin. Visualized lung apices are
unremarkable.
IMPRESSION: Mild left frontal scalp hematoma. Mild diffuse cortical atrophy.
Mild chronic ischemic white matter disease. No acute intracranial
abnormality seen.

Severe multilevel degenerative changes are noted. No acute
abnormality seen in the cervical spine.

## 2017-02-23 ENCOUNTER — Other Ambulatory Visit: Payer: Self-pay

## 2017-02-23 ENCOUNTER — Encounter (HOSPITAL_COMMUNITY): Payer: Self-pay | Admitting: Emergency Medicine

## 2017-02-23 DIAGNOSIS — Z5321 Procedure and treatment not carried out due to patient leaving prior to being seen by health care provider: Secondary | ICD-10-CM | POA: Insufficient documentation

## 2017-02-23 DIAGNOSIS — R9431 Abnormal electrocardiogram [ECG] [EKG]: Secondary | ICD-10-CM | POA: Insufficient documentation

## 2017-02-23 LAB — BASIC METABOLIC PANEL
ANION GAP: 10 (ref 5–15)
BUN: 22 mg/dL — AB (ref 6–20)
CHLORIDE: 99 mmol/L — AB (ref 101–111)
CO2: 25 mmol/L (ref 22–32)
Calcium: 10.3 mg/dL (ref 8.9–10.3)
Creatinine, Ser: 1.01 mg/dL — ABNORMAL HIGH (ref 0.44–1.00)
GFR calc Af Amer: 56 mL/min — ABNORMAL LOW (ref >60)
GFR calc non Af Amer: 49 mL/min — ABNORMAL LOW (ref >60)
Glucose, Bld: 193 mg/dL — ABNORMAL HIGH (ref 65–99)
POTASSIUM: 3.7 mmol/L (ref 3.5–5.1)
SODIUM: 134 mmol/L — AB (ref 135–145)

## 2017-02-23 LAB — CBC WITH DIFFERENTIAL/PLATELET
BASOS ABS: 0.1 10*3/uL (ref 0.0–0.1)
Basophils Relative: 1 %
Eosinophils Absolute: 0.4 10*3/uL (ref 0.0–0.7)
Eosinophils Relative: 5 %
HCT: 38.7 % (ref 36.0–46.0)
HEMOGLOBIN: 12.8 g/dL (ref 12.0–15.0)
LYMPHS ABS: 1.8 10*3/uL (ref 0.7–4.0)
LYMPHS PCT: 27 %
MCH: 30.3 pg (ref 26.0–34.0)
MCHC: 33.1 g/dL (ref 30.0–36.0)
MCV: 91.7 fL (ref 78.0–100.0)
Monocytes Absolute: 0.5 10*3/uL (ref 0.1–1.0)
Monocytes Relative: 7 %
Neutro Abs: 4.2 10*3/uL (ref 1.7–7.7)
Neutrophils Relative %: 60 %
Platelets: 263 10*3/uL (ref 150–400)
RBC: 4.22 MIL/uL (ref 3.87–5.11)
RDW: 13.8 % (ref 11.5–15.5)
WBC: 6.9 10*3/uL (ref 4.0–10.5)

## 2017-02-23 NOTE — ED Triage Notes (Signed)
Pt. advised to go ER by urgent care due to abnormal EKG today , she went to urgent care for her headache and neck pain , denies pain at arrival . No chest pain or SOB .

## 2017-02-24 ENCOUNTER — Emergency Department (HOSPITAL_COMMUNITY)
Admission: EM | Admit: 2017-02-24 | Discharge: 2017-02-24 | Disposition: A | Discharge status: Home / Self Care | Payer: Medicare Other | Attending: Emergency Medicine | Admitting: Emergency Medicine

## 2017-04-06 ENCOUNTER — Ambulatory Visit: Payer: Medicare Other | Admitting: Nurse Practitioner

## 2017-05-23 ENCOUNTER — Ambulatory Visit: Payer: Medicare Other | Admitting: Gastroenterology

## 2017-05-23 ENCOUNTER — Encounter: Payer: Self-pay | Admitting: Gastroenterology

## 2017-05-23 ENCOUNTER — Encounter (INDEPENDENT_AMBULATORY_CARE_PROVIDER_SITE_OTHER): Payer: Self-pay

## 2017-05-23 VITALS — BP 110/70 | HR 72 | Ht <= 58 in | Wt 105.0 lb

## 2017-05-23 DIAGNOSIS — K648 Other hemorrhoids: Secondary | ICD-10-CM

## 2017-05-23 DIAGNOSIS — K921 Melena: Secondary | ICD-10-CM | POA: Diagnosis not present

## 2017-05-23 MED ORDER — HYDROCORTISONE ACE-PRAMOXINE 1-1 % RE CREA: 1.0000 "application " | TOPICAL_CREAM | Freq: Two times a day (BID) | RECTAL | 1 refills | Status: DC

## 2017-05-23 NOTE — Patient Instructions (Signed)
We have sent the following medications to your pharmacy for you to pick up at your convenience: hydrocortisone cream.   RECTAL CARE INSTRUCTIONS:  1. Sitz Baths twice a day for 10 minutes each. 2. Thoroughly clean and dry the rectum. 3. Put Tucks pad against the rectum at night. 4. Clean the rectum with Balenol lotion after each bowel movement.   You can use baby wipes as needed after bowel movements.   Call our office back if your symptoms are no better.   Thank you for choosing me and [facility].  Pricilla Riffle. Dagoberto Ligas., MD., Marval Regal

## 2017-05-23 NOTE — Progress Notes (Signed)
History of Present Illness: This is an 84 year old female with intermittent rectal pain and rectal bleeding.  In addition she complains of a foul odor after wiping following a bowel movement.  She states that once every several months she will have some anal discomfort associated with a small amount of rectal bleeding.  When her rectal bleeding occurs it is noted with bowel movements. She has used Preparation H cream on several occasions and her symptoms have resolved within a few days.  She denies leakage of stool or mucus.  The foul odor after bowel movements is a persistent problem.  She underwent colonoscopy in August 2012 showing 1 adenomatous polyp, left colon diverticulosis and internal hemorrhoids.  In October 2016 she underwent a single column barium enema which revealed diverticulosis and no other lesions.  She denies weight loss, abdominal pain, constipation, diarrhea, change in stool caliber, melena,  nausea, vomiting, dysphagia, reflux symptoms, chest pain.  Allergies  Allergen Reactions  . Codeine   . Miralax [Polyethylene Glycol] Swelling    Feet swelling  . Pentazocine Lactate   . Tramadol Hcl    Outpatient Medications Prior to Visit  Medication Sig Dispense Refill  . aspirin 81 MG tablet Take 81 mg by mouth daily.      Marland Kitchen atorvastatin (LIPITOR) 10 MG tablet Take 10 mg by mouth daily.     . Cholecalciferol (VITAMIN D) 1000 UNITS capsule Take 1,000 Units by mouth daily.      . Coenzyme Q10 (COQ10) 50 MG CAPS Take 1 capsule by mouth daily.      . fesoterodine (TOVIAZ) 4 MG TB24 Take 4 mg by mouth daily.      . fish oil-omega-3 fatty acids 1000 MG capsule Take 1 capsule by mouth daily.      Marland Kitchen FREESTYLE LITE test strip TEST QAM  11  . hydrocortisone (ANUSOL-HC) 25 MG suppository Insert one rectally BID x 10 days 20 suppository 0  . levothyroxine (SYNTHROID, LEVOTHROID) 75 MCG tablet Take 75 mcg by mouth daily.      Marland Kitchen losartan (COZAAR) 50 MG tablet Take 50 mg by mouth daily.        . metFORMIN (GLUCOPHAGE) 500 MG tablet Take 500 mg by mouth 2 (two) times daily with a meal.     . timolol (TIMOPTIC) 0.5 % ophthalmic solution INT 1 GTT INTO OU D  11  . triamterene-hydrochlorothiazide (MAXZIDE) 75-50 MG per tablet Take 0.5 tablets by mouth daily.     Marland Kitchen BETIMOL 0.5 % ophthalmic solution Place 1 drop into both eyes daily.     Marland Kitchen estradiol (CLIMARA - DOSED IN MG/24 HR) 0.075 mg/24hr patch Place 0.075 mg onto the skin once a week.    . meloxicam (MOBIC) 15 MG tablet Take 1 tablet (15 mg total) by mouth daily as needed for pain. (Patient not taking: Reported on 05/23/2017) 30 tablet 0   No facility-administered medications prior to visit.    Past Medical History:  Diagnosis Date  . Allergic rhinitis   . Bronchitis   . Cataract   . CKD (chronic kidney disease)   . Diabetes mellitus   . Diverticulosis 11/2010  . GERD (gastroesophageal reflux disease)   . Glaucoma   . Glaucoma   . Hyperlipidemia   . Hypertension   . Hypothyroid   . Internal hemorrhoids   . Tubular adenoma polyp of rectum 02/2002  . Type 2 diabetes mellitus ([place])    Past Surgical History:  Procedure Laterality Date  .  APPENDECTOMY    . BREAST CYST EXCISION    . CHOLECYSTECTOMY    . COLONOSCOPY    . DIVERTICULITIS  2004   SURGERY FOR DIVERTICULITIS PER PT.  Marland Kitchen HERNIA REPAIR     w/ mesh  . POLYPECTOMY    . TONSILLECTOMY    . TOTAL ABDOMINAL HYSTERECTOMY W/ BILATERAL SALPINGOOPHORECTOMY     Social History   Socioeconomic History  . Marital status: Widowed    Spouse name: None  . Number of children: None  . Years of education: None  . Highest education level: None  Social Needs  . Financial resource strain: None  . Food insecurity - worry: None  . Food insecurity - inability: None  . Transportation needs - medical: None  . Transportation needs - non-medical: None  Occupational History  . None  Tobacco Use  . Smoking status: Never Smoker  . Smokeless tobacco: Never Used  Substance and  Sexual Activity  . Alcohol use: No  . Drug use: No  . Sexual activity: None  Other Topics Concern  . None  Social History Narrative  . None   Family History  Problem Relation Age of Onset  . Lung cancer Father   . Heart disease Mother   . Heart disease Brother   . Diabetes Maternal Aunt   . Diabetes Maternal Uncle   . Diabetes Brother   . Colon cancer Neg Hx       Physical Exam: General: Well developed, well nourished, elderly, frail, no acute distress Head: Normocephalic and atraumatic Eyes:  sclerae anicteric, EOMI Ears: Normal auditory acuity Mouth: No deformity or lesions Lungs: Clear throughout to auscultation Heart: Regular rate and rhythm; no murmurs, rubs or bruits Abdomen: Soft, non tender and non distended. No masses, hepatosplenomegaly or hernias noted. Normal Bowel sounds Rectal: Decreased sphincter tone, decreased squeeze, no external lesions or tenderness, Hemoccult negative brown stool Musculoskeletal: Symmetrical with no gross deformities  Pulses:  Normal pulses noted Extremities: No clubbing, cyanosis, edema or deformities noted Neurological: Alert oriented x 4, grossly nonfocal Psychological:  Alert and cooperative. Normal mood and affect  Assessment and Recommendations:  1.  Intermittent small volume hematochezia and intermittent anal pain, likely due to internal hemorrhoids.  She has decreased anal sphincter tone and the foul she notes could be due to leakage of small amounts of stool or mucus however she does not note any incontinence.  I am unclear as to the cause of the foul odor.  Use baby wipes after bowel movements.  1% hydrocodone apply PR twice daily as needed for hemorrhoidal symptoms.  Standard rectal care instructions.  She is advised to call if her symptoms persist.  Consider hemorrhoidal banding if hemorrhoidal symptoms are persistent or frequently recurrent.

## 2017-09-26 ENCOUNTER — Encounter: Payer: Self-pay | Admitting: Physician Assistant

## 2017-09-26 ENCOUNTER — Ambulatory Visit: Payer: Medicare Other | Admitting: Physician Assistant

## 2017-09-26 ENCOUNTER — Ambulatory Visit (INDEPENDENT_AMBULATORY_CARE_PROVIDER_SITE_OTHER): Payer: Medicare Other

## 2017-09-26 ENCOUNTER — Ambulatory Visit (HOSPITAL_BASED_OUTPATIENT_CLINIC_OR_DEPARTMENT_OTHER): Admission: RE | Admit: 2017-09-26 | Payer: Medicare Other | Source: Ambulatory Visit

## 2017-09-26 ENCOUNTER — Other Ambulatory Visit: Payer: Self-pay

## 2017-09-26 VITALS — BP 158/72 | HR 73 | Temp 98.6°F | Ht 59.06 in | Wt 96.8 lb

## 2017-09-26 DIAGNOSIS — R600 Localized edema: Secondary | ICD-10-CM

## 2017-09-26 DIAGNOSIS — M7989 Other specified soft tissue disorders: Secondary | ICD-10-CM

## 2017-09-26 DIAGNOSIS — M79604 Pain in right leg: Secondary | ICD-10-CM

## 2017-09-26 DIAGNOSIS — R609 Edema, unspecified: Secondary | ICD-10-CM | POA: Diagnosis not present

## 2017-09-26 LAB — POCT URINALYSIS DIP (MANUAL ENTRY)
Bilirubin, UA: NEGATIVE
Glucose, UA: NEGATIVE mg/dL
Ketones, POC UA: NEGATIVE mg/dL
Leukocytes, UA: NEGATIVE
Nitrite, UA: NEGATIVE
PH UA: 5.5 (ref 5.0–8.0)
PROTEIN UA: NEGATIVE mg/dL
SPEC GRAV UA: 1.015 (ref 1.010–1.025)
UROBILINOGEN UA: 0.2 U/dL

## 2017-09-26 LAB — CBC WITH DIFFERENTIAL/PLATELET
BASOS ABS: 0.1 10*3/uL (ref 0.0–0.2)
Basos: 1 %
EOS (ABSOLUTE): 0.5 10*3/uL — AB (ref 0.0–0.4)
Eos: 7 %
HEMATOCRIT: 39.3 % (ref 34.0–46.6)
Hemoglobin: 13 g/dL (ref 11.1–15.9)
IMMATURE GRANULOCYTES: 0 %
Immature Grans (Abs): 0 10*3/uL (ref 0.0–0.1)
Lymphocytes Absolute: 1.8 10*3/uL (ref 0.7–3.1)
Lymphs: 24 %
MCH: 30.1 pg (ref 26.6–33.0)
MCHC: 33.1 g/dL (ref 31.5–35.7)
MCV: 91 fL (ref 79–97)
MONOS ABS: 0.6 10*3/uL (ref 0.1–0.9)
Monocytes: 8 %
NEUTROS PCT: 60 %
Neutrophils Absolute: 4.7 10*3/uL (ref 1.4–7.0)
PLATELETS: 366 10*3/uL (ref 150–450)
RBC: 4.32 x10E6/uL (ref 3.77–5.28)
RDW: 13.7 % (ref 12.3–15.4)
WBC: 7.7 10*3/uL (ref 3.4–10.8)

## 2017-09-26 LAB — CMP14+EGFR
ALT: 18 IU/L (ref 0–32)
AST: 19 IU/L (ref 0–40)
Albumin/Globulin Ratio: 1.2 (ref 1.2–2.2)
Albumin: 4.3 g/dL (ref 3.5–4.7)
Alkaline Phosphatase: 79 IU/L (ref 39–117)
BUN/Creatinine Ratio: 26 (ref 12–28)
BUN: 28 mg/dL — ABNORMAL HIGH (ref 8–27)
Bilirubin Total: 0.5 mg/dL (ref 0.0–1.2)
CO2: 24 mmol/L (ref 20–29)
CREATININE: 1.08 mg/dL — AB (ref 0.57–1.00)
Calcium: 10.9 mg/dL — ABNORMAL HIGH (ref 8.7–10.3)
Chloride: 98 mmol/L (ref 96–106)
GFR calc Af Amer: 53 mL/min/{1.73_m2} — ABNORMAL LOW (ref >59)
GFR calc non Af Amer: 46 mL/min/{1.73_m2} — ABNORMAL LOW (ref >59)
GLUCOSE: 104 mg/dL — AB (ref 65–99)
Globulin, Total: 3.5 g/dL (ref 1.5–4.5)
POTASSIUM: 3.9 mmol/L (ref 3.5–5.2)
Sodium: 137 mmol/L (ref 134–144)
Total Protein: 7.8 g/dL (ref 6.0–8.5)

## 2017-09-26 LAB — BRAIN NATRIURETIC PEPTIDE: BNP: 109 pg/mL — AB (ref 0.0–100.0)

## 2017-09-26 MED ORDER — RIVAROXABAN 15 MG PO TABS: 15.0000 mg | ORAL_TABLET | Freq: Two times a day (BID) | ORAL | 0 refills | Status: DC

## 2017-09-26 NOTE — Patient Instructions (Addendum)
Please take one dose of xarelto tonight and one in the morning. We will contact you tomorrow with your scheduled ultrasound time and place. If any of your symptoms worsen over tonight or you develop any new concerning symptoms like shortness of breath, difficulty breathing chest pain, fast heart beat, redness or warmth in your leg, please seek care immediately.   We will contact you with your lab results. Thank you for letting me participate in your health and well being.   Rivaroxaban oral tablets What is this medicine? RIVAROXABAN (ri va ROX a ban) is an anticoagulant (blood thinner). It is used to treat blood clots in the lungs or in the veins. It is also used after knee or hip surgeries to prevent blood clots. It is also used to lower the chance of stroke in people with a medical condition called atrial fibrillation. This medicine may be used for other purposes; ask your health care provider or pharmacist if you have questions. COMMON BRAND NAME(S): Xarelto, Xarelto Starter Pack What should I tell my health care provider before I take this medicine? They need to know if you have any of these conditions: -bleeding disorders -bleeding in the brain -blood in your stools (black or tarry stools) or if you have blood in your vomit -history of stomach bleeding -kidney disease -liver disease -low blood counts, like low white cell, platelet, or red cell counts -recent or planned spinal or epidural procedure -take medicines that treat or prevent blood clots -an unusual or allergic reaction to rivaroxaban, other medicines, foods, dyes, or preservatives -pregnant or trying to get pregnant -breast-feeding How should I use this medicine? Take this medicine by mouth with a glass of water. Follow the directions on the prescription label. Take your medicine at regular intervals. Do not take it more often than directed. Do not stop taking except on your doctor's advice. Stopping this medicine may increase  your risk of a blood clot. Be sure to refill your prescription before you run out of medicine. If you are taking this medicine after hip or knee replacement surgery, take it with or without food. If you are taking this medicine for atrial fibrillation, take it with your evening meal. If you are taking this medicine to treat blood clots, take it with food at the same time each day. If you are unable to swallow your tablet, you may crush the tablet and mix it in applesauce. Then, immediately eat the applesauce. You should eat more food right after you eat the applesauce containing the crushed tablet. Talk to your pediatrician regarding the use of this medicine in children. Special care may be needed. Overdosage: If you think you have taken too much of this medicine contact a poison control center or emergency room at once. NOTE: This medicine is only for you. Do not share this medicine with others. What if I miss a dose? If you take your medicine once a day and miss a dose, take the missed dose as soon as you remember. If you take your medicine twice a day and miss a dose, take the missed dose immediately. In this instance, 2 tablets may be taken at the same time. The next day you should take 1 tablet twice a day as directed. What may interact with this medicine? Do not take this medicine with any of the following medications: -defibrotide This medicine may also interact with the following medications: -aspirin and aspirin-like medicines -certain antibiotics like erythromycin, azithromycin, and clarithromycin -certain medicines for fungal  infections like ketoconazole and itraconazole -certain medicines for irregular heart beat like amiodarone, quinidine, dronedarone -certain medicines for seizures like carbamazepine, phenytoin -certain medicines that treat or prevent blood clots like warfarin, enoxaparin, and dalteparin -conivaptan -diltiazem -felodipine -indinavir -lopinavir; ritonavir -NSAIDS,  medicines for pain and inflammation, like ibuprofen or naproxen -ranolazine -rifampin -ritonavir -SNRIs, medicines for depression, like desvenlafaxine, duloxetine, levomilnacipran, venlafaxine -SSRIs, medicines for depression, like citalopram, escitalopram, fluoxetine, fluvoxamine, paroxetine, sertraline -St. John's wort -verapamil This list may not describe all possible interactions. Give your health care provider a list of all the medicines, herbs, non-prescription drugs, or dietary supplements you use. Also tell them if you smoke, drink alcohol, or use illegal drugs. Some items may interact with your medicine. What should I watch for while using this medicine? Visit your doctor or health care professional for regular checks on your progress. Notify your doctor or health care professional and seek emergency treatment if you develop breathing problems; changes in vision; chest pain; severe, sudden headache; pain, swelling, warmth in the leg; trouble speaking; sudden numbness or weakness of the face, arm or leg. These can be signs that your condition has gotten worse. If you are going to have surgery or other procedure, tell your doctor that you are taking this medicine. What side effects may I notice from receiving this medicine? Side effects that you should report to your doctor or health care professional as soon as possible: -allergic reactions like skin rash, itching or hives, swelling of the face, lips, or tongue -back pain -redness, blistering, peeling or loosening of the skin, including inside the mouth -signs and symptoms of bleeding such as bloody or black, tarry stools; red or dark-brown urine; spitting up blood or brown material that looks like coffee grounds; red spots on the skin; unusual bruising or bleeding from the eye, gums, or nose Side effects that usually do not require medical attention (report to your doctor or health care professional if they continue or are  bothersome): -dizziness -muscle pain This list may not describe all possible side effects. Call your doctor for medical advice about side effects. You may report side effects to FDA at 1-800-FDA-1088. Where should I keep my medicine? Keep out of the reach of children. Store at room temperature between 15 and 30 degrees C (59 and 86 degrees F). Throw away any unused medicine after the expiration date. NOTE: This sheet is a summary. It may not cover all possible information. If you have questions about this medicine, talk to your doctor, pharmacist, or health care provider.  2018 Elsevier/Gold Standard (2015-12-31 16:29:33)     IF you received an x-ray today, you will receive an invoice from [facility]. Please contact [facility] at [phone] with questions or concerns regarding your invoice.   IF you received labwork today, you will receive an invoice from [facility]. Please contact [facility] at [phone] with questions or concerns regarding your invoice.   Our billing staff will not be able to assist you with questions regarding bills from these companies.  You will be contacted with the lab results as soon as they are available. The fastest way to get your results is to activate your My Chart account. Instructions are located on the last page of this paperwork. If you have not heard from [place] regarding the results in 2 weeks, please contact this office.

## 2017-09-26 NOTE — Progress Notes (Signed)
Jade [name]  MRN: [id number] DOB: [birth year]  Subjective:  Jade [name] is a 84 y.o. female with PMH CKD, CHF, CAD DM, HTN, HLD, and hypothyroidism.  Seen in office today for a chief complaint of bilateral ankle swelling x 3 days.  R >L.  Has associated pain in right calf.  Swelling does not improve with leg elevation.  Denies overlying redness, warmth, dyspnea on exertion, shortness of breath, chest pain,  heart palpitations, orthopnea, and PND.  Patient has recently increased her salt intake.  She is pretty active, mows her yard regularly.  Has lost about 10 lbs in the past 6 months, changed her diet.Has had similar swelling in the past but has taken medication for with resolve of symptoms.  Typically returns during hot weather.  Has not tried anything more for relief.  Denies smoking. No recent surgery/travel/immobilization, hx of cancer, hemoptysis, prior DVT/PE, or hormone use.Primary care doctor is Dr.Pharr.   Review of Systems  Constitutional: Negative for chills, diaphoresis and fever.  HENT: Negative for congestion and sore throat.   Eyes: Negative for photophobia and visual disturbance.  Respiratory: Negative for chest tightness and wheezing.   Gastrointestinal: Negative for abdominal pain, nausea and vomiting.  Endocrine: Negative for polydipsia, polyphagia and polyuria.  Genitourinary: Negative for decreased urine volume, difficulty urinating, dysuria, flank pain, frequency and urgency.  Musculoskeletal: Negative for gait problem and neck pain.  Skin: Negative for rash.  Neurological: Negative for dizziness, light-headedness and numbness.  Hematological: Does not bruise/bleed easily.  Psychiatric/Behavioral: Negative for confusion and hallucinations.    Patient Active Problem List   Diagnosis Date Noted  . Rectal bleeding 07/24/2013  . DYSPNEA ON EXERTION 02/03/2010  . HYPOTHYROIDISM 08/07/2008  . DIABETES, TYPE 2 08/07/2008  . HYPERTENSION 08/07/2008  . Seasonal  and perennial allergic rhinitis 08/09/2007  . BRONCHITIS 08/09/2007    Current Outpatient Medications on File Prior to Visit  Medication Sig Dispense Refill  . aspirin 81 MG tablet Take 81 mg by mouth daily.      Marland Kitchen atorvastatin (LIPITOR) 10 MG tablet Take 10 mg by mouth daily.     . Cholecalciferol (VITAMIN D) 1000 UNITS capsule Take 1,000 Units by mouth daily.      . Coenzyme Q10 (COQ10) 50 MG CAPS Take 1 capsule by mouth daily.      . fesoterodine (TOVIAZ) 4 MG TB24 Take 4 mg by mouth daily.      . fish oil-omega-3 fatty acids 1000 MG capsule Take 1 capsule by mouth daily.      Marland Kitchen FREESTYLE LITE test strip TEST QAM  11  . hydrocortisone (ANUSOL-HC) 25 MG suppository Insert one rectally BID x 10 days 20 suppository 0  . levothyroxine (SYNTHROID, LEVOTHROID) 75 MCG tablet Take 75 mcg by mouth daily.      Marland Kitchen losartan (COZAAR) 50 MG tablet Take 50 mg by mouth daily.      . metFORMIN (GLUCOPHAGE) 500 MG tablet Take 500 mg by mouth 2 (two) times daily with a meal.     . pramoxine-hydrocortisone (PROCTOCREAM-HC) 1-1 % rectal cream Place 1 application rectally 2 (two) times daily. 30 g 1  . timolol (TIMOPTIC) 0.5 % ophthalmic solution INT 1 GTT INTO OU D  11  . triamterene-hydrochlorothiazide (MAXZIDE) 75-50 MG per tablet Take 0.5 tablets by mouth daily.      No current facility-administered medications on file prior to visit.     Allergies  Allergen Reactions  . Codeine   . Miralax [  Polyethylene Glycol] Swelling    Feet swelling  . Pentazocine Lactate   . Tramadol Hcl       Social History   Socioeconomic History  . Marital status: Widowed    Spouse name: Not on file  . Number of children: Not on file  . Years of education: Not on file  . Highest education level: Not on file  Occupational History  . Not on file  Social Needs  . Financial resource strain: Not on file  . Food insecurity:    Worry: Not on file    Inability: Not on file  . Transportation needs:    Medical: Not on  file    Non-medical: Not on file  Tobacco Use  . Smoking status: Never Smoker  . Smokeless tobacco: Never Used  Substance and Sexual Activity  . Alcohol use: No  . Drug use: No  . Sexual activity: Not on file  Lifestyle  . Physical activity:    Days per week: Not on file    Minutes per session: Not on file  . Stress: Not on file  Relationships  . Social connections:    Talks on phone: Not on file    Gets together: Not on file    Attends religious service: Not on file    Active member of club or organization: Not on file    Attends meetings of clubs or organizations: Not on file    Relationship status: Not on file  . Intimate partner violence:    Fear of current or ex partner: Not on file    Emotionally abused: Not on file    Physically abused: Not on file    Forced sexual activity: Not on file  Other Topics Concern  . Not on file  Social History Narrative  . Not on file   Past Surgical History:  Procedure Laterality Date  . APPENDECTOMY    . BREAST CYST EXCISION    . CHOLECYSTECTOMY    . COLONOSCOPY    . DIVERTICULITIS  2004   SURGERY FOR DIVERTICULITIS PER PT.  Marland Kitchen HERNIA REPAIR     w/ mesh  . POLYPECTOMY    . TONSILLECTOMY    . TOTAL ABDOMINAL HYSTERECTOMY W/ BILATERAL SALPINGOOPHORECTOMY     Family History  Problem Relation Age of Onset  . Lung cancer Father   . Heart disease Mother   . Heart disease Brother   . Diabetes Maternal Aunt   . Diabetes Maternal Uncle   . Diabetes Brother   . Colon cancer Neg Hx     Objective:  BP (!) 158/72 (BP Location: Left Arm, Patient Position: Sitting, Cuff Size: Normal)   Pulse 73   Temp 98.6 F (37 C) (Oral)   Ht 4' 11.06" (1.5 m)   Wt 96 lb 12.8 oz (43.9 kg)   SpO2 96%   BMI 19.51 kg/m   Physical Exam  Constitutional: She is oriented to person, place, and time. She appears well-developed and well-nourished. No distress.  HENT:  Head: Normocephalic and atraumatic.  Eyes: Conjunctivae are normal.  Neck:  Normal range of motion.  Cardiovascular: Normal rate, regular rhythm, normal heart sounds and intact distal pulses. Exam reveals no gallop and no friction rub.  No murmur heard. No JVD noted.  Pulmonary/Chest: Effort normal. She has no decreased breath sounds. She has no wheezes. She has no rhonchi. She has rales (faint rales ausculatated in posterior b/l lower lung fields).  Musculoskeletal:       Right  lower leg: She exhibits tenderness (mild TTP of RLE) and edema (1+ pitting to midshin ).       Left lower leg: She exhibits edema ( 1+ pitting to midshin   ). She exhibits no tenderness and no bony tenderness.  Positive Homan's sign of RLE.   Neurological: She is alert and oriented to person, place, and time.  Skin: Skin is warm and dry.  Ankle measurements- Right: 20 cm Left: 19.5 cm  Calf measurements- Right: 29 cm Left 27.5 cm  No overlying erythema or warmth in BLE.   Psychiatric: She has a normal mood and affect.  Vitals reviewed.    Results for orders placed or performed in visit on 09/26/17 (from the past 24 hour(s))  POCT urinalysis dipstick     Status: Abnormal   Collection Time: 09/26/17  5:58 PM  Result Value Ref Range   Color, UA yellow yellow   Clarity, UA clear clear   Glucose, UA negative negative mg/dL   Bilirubin, UA negative negative   Ketones, POC UA negative negative mg/dL   Spec Grav, UA 1.015 1.010 - 1.025   Blood, UA trace-intact (A) negative   pH, UA 5.5 5.0 - 8.0   Protein Ur, POC negative negative mg/dL   Urobilinogen, UA 0.2 0.2 or 1.0 E.U./dL   Nitrite, UA Negative Negative   Leukocytes, UA Negative Negative    Dg Chest 2 View  Result Date: 09/26/2017 CLINICAL DATA:  Bilateral lower extremity edema for 3 days. Crackles auscultated in bilateral lower lungs. EXAM: CHEST - 2 VIEW COMPARISON:  Radiographs 09/06/2013 FINDINGS: The heart is normal in size. Tortuosity and atherosclerosis of the thoracic aorta, unchanged from prior exam. Pulmonary  vasculature is normal. Minimal left basilar atelectasis. No consolidation, pleural effusion, or pneumothorax. No acute osseous abnormalities are seen. Chronic degenerative change of the right shoulder. IMPRESSION: 1. Normal pulmonary vasculature. No evidence of fluid overload or CHF. 2. Mild left lung base subsegmental atelectasis. 3.  Aortic Atherosclerosis (ICD10-I70.0). Electronically Signed   By: Jeb Levering M.D.   On: 09/26/2017 18:21    Wt Readings from Last 3 Encounters:  09/26/17 96 lb 12.8 oz (43.9 kg)  05/23/17 105 lb (47.6 kg)  02/23/17 103 lb (46.7 kg)   BP Readings from Last 3 Encounters:  09/26/17 (!) 158/72  05/23/17 110/70  02/23/17 (!) 174/82    Well's Criteria for DVT score of 2.   Assessment and Plan :  This case was precepted with Dr. Tamala Julian.  1  Pain and swelling of right lower extremity Patient is overall well-appearing, no distress.  Patient has bilateral lower extremity swelling, R>L. + Homans sign in the right. Recommended stat ultrasound.  Due to time of day, only imaging center available is [facility].  Unfortunately, patient does not have anyone to take her to this location and she does not know the area well enough to drive herself there.  Therefore recommend empiric treatment for DVT with Xarelto and schedule stat US  for first thing tomorrow morning.  Discussed potential risk and side effects of Xarelto with patient.  She agrees to treatment. - Rivaroxaban (XARELTO) 15 MG TABS tablet; Take 1 tablet (15 mg total) by mouth 2 (two) times daily with a meal.  Dispense: 4 tablet; Refill: 0  2. Bilateral lower extremity edema Unclear etiology at this time.  STAT labs pending. BP is mildly elevated at 158/72.  No proteinuria. Do not suggest CHF exacerbation as there are faint crackles noted  in bilateral lower lung fields with no acute findings on CXR. Patient has actually lost weight.  No JVD noted..  No symptoms of shortness of breath, orthopnea,  or  dyspnea on exertion.  - POCT urinalysis dipstick - CBC with Differential/Platelet - CMP14+EGFR - Brain natriuretic peptide - DG Chest 2 View; Future - VAS [place] LOWER EXTREMITY VENOUS (DVT); Future - VAS [place] LOWER EXTREMITY VENOUS (DVT)  Side effects, risks, benefits, and alternatives of the medications and treatment plan prescribed today were discussed, and patient expressed understanding of the instructions given. No barriers to understanding were identified. Red flags discussed in detail. Given strict ED precautions. Pt expressed understanding regarding what to do in case of emergency/urgent symptoms.  Tenna Delaine PA-C  [facility] 09/26/2017 6:27 PM

## 2017-09-27 ENCOUNTER — Ambulatory Visit (HOSPITAL_COMMUNITY)
Admission: RE | Admit: 2017-09-27 | Discharge: 2017-09-27 | Disposition: A | Discharge status: Home / Self Care | Payer: Medicare Other | Source: Ambulatory Visit | Attending: Physician Assistant | Admitting: Physician Assistant

## 2017-09-27 DIAGNOSIS — R609 Edema, unspecified: Secondary | ICD-10-CM | POA: Diagnosis not present

## 2017-09-27 DIAGNOSIS — R600 Localized edema: Secondary | ICD-10-CM | POA: Diagnosis not present

## 2017-09-27 NOTE — Progress Notes (Signed)
Preliminary notes--Bilateral lower extremities venous duplex exam completed. Negative for DVT.    A 1.53x0.53x2.03cm non-vascular complex cystic structure seen at medial portion of knee, distal of popliteal fossa.   Result called Dr. Timmothy Euler, patient was instructed to go home.   [place] Athalene Kolle (RDMS RVT) 09/27/17 2:26 PM

## 2017-09-28 ENCOUNTER — Other Ambulatory Visit: Payer: Self-pay | Admitting: Physician Assistant

## 2017-09-28 ENCOUNTER — Encounter: Payer: Self-pay | Admitting: Physician Assistant

## 2017-09-28 DIAGNOSIS — M7121 Synovial cyst of popliteal space [Baker], right knee: Secondary | ICD-10-CM

## 2017-09-28 NOTE — Progress Notes (Signed)
Pt contacted with [place] results. Negative for DVT. Positive for A cystic structure is found in the popliteal fossa. Will refer to orthopedics for further evaluation. Pt did not start xarelto. Encouraged her to f/u with her primary care doctor.

## 2018-03-08 ENCOUNTER — Ambulatory Visit: Payer: Medicare Other | Admitting: Nurse Practitioner

## 2018-03-08 ENCOUNTER — Encounter: Payer: Self-pay | Admitting: Nurse Practitioner

## 2018-03-08 VITALS — BP 138/72 | HR 56 | Ht 59.0 in | Wt 97.4 lb

## 2018-03-08 DIAGNOSIS — K5900 Constipation, unspecified: Secondary | ICD-10-CM | POA: Diagnosis not present

## 2018-03-08 DIAGNOSIS — R109 Unspecified abdominal pain: Secondary | ICD-10-CM | POA: Diagnosis not present

## 2018-03-08 NOTE — Progress Notes (Signed)
Chief Complaint:  Right sided abdominal pain    IMPRESSION and PLAN:    84 year old female here with a two-week history of right mid abdominal pain, nearly constant and unrelated to meals, bowel movements or activity.  She does describe constipation with only 1-2 small stools a day despite milk of magnesia.  No associated nausea, vomiting, fevers -Apparently had abdominal films at PCPs office, following that she was started on milk of magnesia. Still constipated despite daily MOM.  -I asked her to stop milk of magnesia. She has CKD and it isn't helping her anyway -We need to evacuate her bowels as the first step in sorting through her abdominal pain.  Because of her history of CKD3 patient is nervous about a bowel purge.  She is even resistant to taking MiraLAX several times a day for the next couple of days. -She would like to try Dulcolax tablets though I cautioned her against their potential to cause cramping.  She would still like to proceed.  Therefore I have asked her to take 2 Dulcolax today, 2 in the morning.  We should hear from her by Friday morning and if not better then consider CTAP of abdomen and pelvis, preferable with contrast  HPI:     Patient is an 84 year old female known to Dr. Fuller Plan with a history of CKD 3, DM, GERD, and history of colon polyps.  Xarelto on home med list but she is not taking it . Patient was evaluated in January of this year for low-volume hematochezia felt to be hemorrhoidal related.  Patient comes in today for evaluation of right mid abdominal pain.  The pain started approximately 2 weeks ago.  Patient says her PCP did an x-ray in the office and following that she was started on milk of magnesia.  The pain is nearly constant, not affected by eating or movement.  She has been taking daily milk of magnesia as recommended but only having 1-2 small stools a day.  No urinary symptoms she says a recent urinalysis by PCP was unremarkable.  She denies fever.   No nausea or vomiting. No recent heavy lifting / straining.  She still continues to see scant amounts of blood in her stool from time to time but this is chronic and unchanged.  Review of systems:     No chest pain, no SOB, no fevers, no urinary sx   Past Medical History:  Diagnosis Date  . Allergic rhinitis   . Bronchitis   . Cataract   . CKD (chronic kidney disease)   . Diabetes mellitus   . Diverticulosis 11/2010  . GERD (gastroesophageal reflux disease)   . Glaucoma   . Glaucoma   . Hyperlipidemia   . Hypertension   . Hypothyroid   . Internal hemorrhoids   . Tubular adenoma polyp of rectum 02/2002  . Type 2 diabetes mellitus (HCC)     Patient's surgical history, family medical history, social history, medications and allergies were all reviewed in Epic   Creatinine clearance cannot be calculated (Patient's most recent lab result is older than the maximum 21 days allowed.)  Current Outpatient Medications  Medication Sig Dispense Refill  . aspirin 81 MG tablet Take 81 mg by mouth daily.      Marland Kitchen atorvastatin (LIPITOR) 10 MG tablet Take 5 mg by mouth daily.     . Cholecalciferol (VITAMIN D) 1000 UNITS capsule Take 1,000 Units by mouth daily.      . Coenzyme Q10 (  COQ10) 50 MG CAPS Take 1 capsule by mouth daily.      . fish oil-omega-3 fatty acids 1000 MG capsule Take 1 capsule by mouth daily.      Marland Kitchen FREESTYLE LITE test strip TEST QAM  11  . levothyroxine (SYNTHROID, LEVOTHROID) 75 MCG tablet Take 75 mcg by mouth daily.      Marland Kitchen losartan (COZAAR) 50 MG tablet Take 25 mg by mouth daily.     . metFORMIN (GLUCOPHAGE) 500 MG tablet Take 500 mg by mouth 2 (two) times daily with a meal.     . timolol (TIMOPTIC) 0.5 % ophthalmic solution INT 1 GTT INTO OU D  11  . triamterene-hydrochlorothiazide (MAXZIDE) 75-50 MG per tablet Take 0.5 tablets by mouth daily.     . Rivaroxaban (XARELTO) 15 MG TABS tablet Take 1 tablet (15 mg total) by mouth 2 (two) times daily with a meal. (Patient not  taking: Reported on 03/08/2018) 4 tablet 0   No current facility-administered medications for this visit.     Physical Exam:     BP 138/72   Pulse (!) 56   Ht 4\' 11"  (1.499 m)   Wt 97 lb 6 oz (44.2 kg)   BMI 19.67 kg/m   GENERAL:  Pleasant female in NAD PSYCH: : Cooperative, normal affect EENT:  conjunctiva pink, mucous membranes moist, neck supple without masses CARDIAC:  RRR, no peripheral edema PULM: Normal respiratory effort, lungs CTA bilaterally, no wheezing ABDOMEN:  Nondistended, soft, mild -moderate right mid abdominal tenderness. No obvious masses, no hepatomegaly,  normal bowel sounds SKIN:  turgor, no lesions seen Musculoskeletal:  Normal muscle tone, normal strength NEURO: Alert and oriented x 3, no focal neurologic deficits   Tye Savoy , NP 03/08/2018, 9:16 AM

## 2018-03-08 NOTE — Patient Instructions (Addendum)
If you are age 82 or older, your body mass index should be between 23-30. Your Body mass index is 19.67 kg/m. If this is out of the aforementioned range listed, please consider follow up with your Primary Care Provider.  If you are age 86 or younger, your body mass index should be between 19-25. Your Body mass index is 19.67 kg/m. If this is out of the aformentioned range listed, please consider follow up with your Primary Care Provider.   STOP Milk of Magnesia.  Take Dulcolax 2 now and 2 in the morning.  Call by Friday morning if not better after bowel regimen.  Thank you for choosing me and [facility].   Tye Savoy, NP

## 2018-03-09 ENCOUNTER — Telehealth: Payer: Self-pay | Admitting: Nurse Practitioner

## 2018-03-09 ENCOUNTER — Encounter: Payer: Self-pay | Admitting: Nurse Practitioner

## 2018-03-09 NOTE — Telephone Encounter (Signed)
Good.  My hope was that if she purged bowels that the right sided abdominal pain would resolve.  She should let us know if this is not the case.  Going forward she does need to take something to prevent constipation.  I know she was hesitant about MiraLAX because of her kidney disease but taking it once a day should be fine.

## 2018-03-09 NOTE — Telephone Encounter (Signed)
Calls with report of her progress and a question. She took 2 Dulcolax last night. She began having bowel movements this morning. After a point they became diarrhea. She ate a "normal breakfast and a normal lunch." The bowel movements have tapered off and stopped. She feels "empty." She feels she should take only one more Dulcolax. She plans to take it tonight at bedtime with expectations of this producing normal bowel movements in the morning. She will let us know of her "situation" tomorrow. Denies any abdominal pain. Rectum is a "little sore" and she will put Preparation H on it.

## 2018-03-10 NOTE — Telephone Encounter (Signed)
Jade [name], If patient is sure that constipation has resolved and still having the same pain then we can get CTAP with contrast (she may need update BMET). Thanks

## 2018-03-10 NOTE — Telephone Encounter (Signed)
Jade [name] the patient is having "good" bowel movements and they are soft movements. She continues to have RLQ pain. Hurts more when she is standing up or walking. Hurts less if she will lie down. She is afebrile. No nausea or vomiting. She is willing to take Benefiber or Colace. Please advise.

## 2018-03-10 NOTE — Telephone Encounter (Signed)
Pt waiting for information

## 2018-03-12 NOTE — Progress Notes (Signed)
Reviewed and agree with initial management plan.  Malcolm T. Stark, MD FACG 

## 2018-03-13 ENCOUNTER — Other Ambulatory Visit: Payer: Self-pay

## 2018-03-13 ENCOUNTER — Other Ambulatory Visit (INDEPENDENT_AMBULATORY_CARE_PROVIDER_SITE_OTHER): Payer: Medicare Other

## 2018-03-13 DIAGNOSIS — R109 Unspecified abdominal pain: Secondary | ICD-10-CM

## 2018-03-13 LAB — BUN: BUN: 16 mg/dL (ref 6–23)

## 2018-03-13 LAB — CREATININE, SERUM: Creatinine, Ser: 1.01 mg/dL (ref 0.40–1.20)

## 2018-03-13 NOTE — Telephone Encounter (Signed)
She doesn't want to take Miralax so at this point have her increase fluid intake to 6-8 glasses a day and continue the fiber and colace. Okay to add in one dulcolax daily until we can see what CT scan shows. Thanks

## 2018-03-13 NOTE — Telephone Encounter (Signed)
Discussed this with the patient. She expresses understanding.  She is scheduled for the CT abd/pelvis at [facility] 03/15/18 in [facility]. She has already come today for the lab work. She will come back tomorrow to receive her written instructions and oral contrast for the CT.

## 2018-03-13 NOTE — Telephone Encounter (Signed)
Spoke with the patient this morning to schedule the CT and labs. She reports the last bowel movement was Saturday and it was difficult to pass. It was very little bowel movement. She is taking Colace TID. Benefiber TID. Today she will come for the BUN and Creatinine. Please advise.

## 2018-03-14 ENCOUNTER — Other Ambulatory Visit: Payer: Self-pay

## 2018-03-15 ENCOUNTER — Ambulatory Visit (HOSPITAL_COMMUNITY)
Admission: RE | Admit: 2018-03-15 | Discharge: 2018-03-15 | Disposition: A | Discharge status: Home / Self Care | Payer: Medicare Other | Source: Ambulatory Visit | Attending: Nurse Practitioner | Admitting: Nurse Practitioner

## 2018-03-15 ENCOUNTER — Telehealth: Payer: Self-pay | Admitting: Nurse Practitioner

## 2018-03-15 DIAGNOSIS — K5730 Diverticulosis of large intestine without perforation or abscess without bleeding: Secondary | ICD-10-CM | POA: Insufficient documentation

## 2018-03-15 DIAGNOSIS — I700 Atherosclerosis of aorta: Secondary | ICD-10-CM | POA: Diagnosis not present

## 2018-03-15 DIAGNOSIS — I2510 Atherosclerotic heart disease of native coronary artery without angina pectoris: Secondary | ICD-10-CM | POA: Insufficient documentation

## 2018-03-15 DIAGNOSIS — J439 Emphysema, unspecified: Secondary | ICD-10-CM | POA: Insufficient documentation

## 2018-03-15 DIAGNOSIS — N281 Cyst of kidney, acquired: Secondary | ICD-10-CM | POA: Insufficient documentation

## 2018-03-15 DIAGNOSIS — R109 Unspecified abdominal pain: Secondary | ICD-10-CM | POA: Insufficient documentation

## 2018-03-15 MED ORDER — IOHEXOL 300 MG/ML  SOLN
100.0000 mL | Freq: Once | INTRAMUSCULAR | Status: AC | PRN
  Administered 2018-03-15: 100 mL via INTRAVENOUS

## 2018-03-15 NOTE — Telephone Encounter (Signed)
She is drinking CT contrast. It is okay if it is not "doing" anything.  First bottle was to be consumed at 2:00 Second bottle was to be consumed at 3:00 pm Arrive at [facility] at 3:45 pm. Left her a message to call me back. Keep her CT appointment.

## 2018-03-17 ENCOUNTER — Other Ambulatory Visit: Payer: Self-pay

## 2018-03-17 MED ORDER — METRONIDAZOLE 500 MG PO TABS: 500.0000 mg | ORAL_TABLET | Freq: Three times a day (TID) | ORAL | 0 refills | Status: AC

## 2018-03-17 MED ORDER — CIPROFLOXACIN HCL 250 MG PO TABS: 250.0000 mg | ORAL_TABLET | Freq: Two times a day (BID) | ORAL | 0 refills | Status: AC

## 2018-03-20 ENCOUNTER — Telehealth: Payer: Self-pay | Admitting: Nurse Practitioner

## 2018-03-20 MED ORDER — AMOXICILLIN-POT CLAVULANATE 875-125 MG PO TABS: 1.0000 | ORAL_TABLET | Freq: Two times a day (BID) | ORAL | 0 refills | Status: DC

## 2018-03-20 NOTE — Telephone Encounter (Signed)
Pt states she started taking the cipro on Friday but she has not taken the flagyl and will not take it due to all the possible side effects. States she cannot take that because she lives alone. Reports she has some blisters on her hands and thinks that the cipro may have caused those. Pt reports the right side of her abdomen is still hurting and now her left side is also hurting. Wants to know if there is something else "less dangerous" to call in. Please advise.

## 2018-03-20 NOTE — Telephone Encounter (Signed)
Jade [name], We can try Augmentin 875 mg twice daily.  Let us give her 1 week's worth.  I do not see that she has an allergy to this but if you see any contraindications to this med when you put the order in, please let me know.Thanks.  Also, you can tell her that any medication is going to have " potential " side effects.  We try and choose the safest ones for patients but cannot always predict whether they will work or if there will be adverse events.  If she really thinks the blisters are due to the Cipro then we should add it to her allergy list.

## 2018-03-20 NOTE — Telephone Encounter (Signed)
Prescription sent to pharmacy and pt aware. 

## 2018-03-22 ENCOUNTER — Telehealth: Payer: Self-pay | Admitting: Nurse Practitioner

## 2018-03-22 NOTE — Telephone Encounter (Signed)
Jade [name] this is the patient who is not willing to take Miralax. Accustom to Dulcolax tablets. You just changed the atb for her to Augmentin. Treating her for diverticulitis. How do you feel about laxative use right now? Thank you

## 2018-03-27 NOTE — Telephone Encounter (Signed)
It is okay to take on occasion but would take one as two can sometimes cause cramping. Thanks

## 2018-03-30 ENCOUNTER — Other Ambulatory Visit: Payer: Medicare Other

## 2018-04-23 ENCOUNTER — Emergency Department (HOSPITAL_COMMUNITY): Payer: Medicare Other

## 2018-04-23 ENCOUNTER — Ambulatory Visit (INDEPENDENT_AMBULATORY_CARE_PROVIDER_SITE_OTHER)
Admission: EM | Admit: 2018-04-23 | Discharge: 2018-04-23 | Disposition: A | Discharge status: Home / Self Care | Payer: Medicare Other | Source: Home / Self Care

## 2018-04-23 ENCOUNTER — Encounter (HOSPITAL_COMMUNITY): Payer: Self-pay

## 2018-04-23 ENCOUNTER — Inpatient Hospital Stay (HOSPITAL_COMMUNITY)
Admission: EM | Admit: 2018-04-23 | Discharge: 2018-04-27 | DRG: 392 | Disposition: A | Discharge status: Home Health | Payer: Medicare Other | Attending: Internal Medicine | Admitting: Internal Medicine

## 2018-04-23 DIAGNOSIS — K5792 Diverticulitis of intestine, part unspecified, without perforation or abscess without bleeding: Secondary | ICD-10-CM | POA: Diagnosis not present

## 2018-04-23 DIAGNOSIS — K5720 Diverticulitis of large intestine with perforation and abscess without bleeding: Principal | ICD-10-CM | POA: Diagnosis present

## 2018-04-23 DIAGNOSIS — E876 Hypokalemia: Secondary | ICD-10-CM | POA: Diagnosis present

## 2018-04-23 DIAGNOSIS — I10 Essential (primary) hypertension: Secondary | ICD-10-CM | POA: Diagnosis not present

## 2018-04-23 DIAGNOSIS — I129 Hypertensive chronic kidney disease with stage 1 through stage 4 chronic kidney disease, or unspecified chronic kidney disease: Secondary | ICD-10-CM | POA: Diagnosis present

## 2018-04-23 DIAGNOSIS — E039 Hypothyroidism, unspecified: Secondary | ICD-10-CM | POA: Diagnosis present

## 2018-04-23 DIAGNOSIS — D72820 Lymphocytosis (symptomatic): Secondary | ICD-10-CM

## 2018-04-23 DIAGNOSIS — K5900 Constipation, unspecified: Secondary | ICD-10-CM | POA: Diagnosis not present

## 2018-04-23 DIAGNOSIS — Z885 Allergy status to narcotic agent status: Secondary | ICD-10-CM

## 2018-04-23 DIAGNOSIS — Z79890 Hormone replacement therapy (postmenopausal): Secondary | ICD-10-CM

## 2018-04-23 DIAGNOSIS — N179 Acute kidney failure, unspecified: Secondary | ICD-10-CM | POA: Diagnosis present

## 2018-04-23 DIAGNOSIS — Z90710 Acquired absence of both cervix and uterus: Secondary | ICD-10-CM

## 2018-04-23 DIAGNOSIS — R1032 Left lower quadrant pain: Secondary | ICD-10-CM

## 2018-04-23 DIAGNOSIS — E119 Type 2 diabetes mellitus without complications: Secondary | ICD-10-CM | POA: Diagnosis not present

## 2018-04-23 DIAGNOSIS — Z7982 Long term (current) use of aspirin: Secondary | ICD-10-CM

## 2018-04-23 DIAGNOSIS — E1122 Type 2 diabetes mellitus with diabetic chronic kidney disease: Secondary | ICD-10-CM | POA: Diagnosis present

## 2018-04-23 DIAGNOSIS — Z8249 Family history of ischemic heart disease and other diseases of the circulatory system: Secondary | ICD-10-CM

## 2018-04-23 DIAGNOSIS — M5489 Other dorsalgia: Secondary | ICD-10-CM

## 2018-04-23 DIAGNOSIS — H409 Unspecified glaucoma: Secondary | ICD-10-CM | POA: Diagnosis present

## 2018-04-23 DIAGNOSIS — N183 Chronic kidney disease, stage 3 (moderate): Secondary | ICD-10-CM | POA: Diagnosis present

## 2018-04-23 DIAGNOSIS — Z833 Family history of diabetes mellitus: Secondary | ICD-10-CM

## 2018-04-23 DIAGNOSIS — Z9049 Acquired absence of other specified parts of digestive tract: Secondary | ICD-10-CM

## 2018-04-23 DIAGNOSIS — K219 Gastro-esophageal reflux disease without esophagitis: Secondary | ICD-10-CM | POA: Diagnosis present

## 2018-04-23 DIAGNOSIS — Z801 Family history of malignant neoplasm of trachea, bronchus and lung: Secondary | ICD-10-CM

## 2018-04-23 DIAGNOSIS — K5732 Diverticulitis of large intestine without perforation or abscess without bleeding: Secondary | ICD-10-CM

## 2018-04-23 DIAGNOSIS — E785 Hyperlipidemia, unspecified: Secondary | ICD-10-CM | POA: Diagnosis present

## 2018-04-23 DIAGNOSIS — Z888 Allergy status to other drugs, medicaments and biological substances status: Secondary | ICD-10-CM

## 2018-04-23 LAB — CBC WITH DIFFERENTIAL/PLATELET
Abs Immature Granulocytes: 0.14 10*3/uL — ABNORMAL HIGH (ref 0.00–0.07)
Basophils Absolute: 0.1 10*3/uL (ref 0.0–0.1)
Basophils Relative: 0 %
EOS ABS: 0 10*3/uL (ref 0.0–0.5)
Eosinophils Relative: 0 %
HCT: 37.2 % (ref 36.0–46.0)
Hemoglobin: 12 g/dL (ref 12.0–15.0)
IMMATURE GRANULOCYTES: 1 %
Lymphocytes Relative: 7 %
Lymphs Abs: 1.3 10*3/uL (ref 0.7–4.0)
MCH: 30.2 pg (ref 26.0–34.0)
MCHC: 32.3 g/dL (ref 30.0–36.0)
MCV: 93.7 fL (ref 80.0–100.0)
Monocytes Absolute: 1.1 10*3/uL — ABNORMAL HIGH (ref 0.1–1.0)
Monocytes Relative: 6 %
NEUTROS PCT: 86 %
Neutro Abs: 15.8 10*3/uL — ABNORMAL HIGH (ref 1.7–7.7)
Platelets: 242 10*3/uL (ref 150–400)
RBC: 3.97 MIL/uL (ref 3.87–5.11)
RDW: 13.2 % (ref 11.5–15.5)
WBC: 18.4 10*3/uL — ABNORMAL HIGH (ref 4.0–10.5)
nRBC: 0 % (ref 0.0–0.2)

## 2018-04-23 LAB — POCT URINALYSIS DIP (DEVICE)
Bilirubin Urine: NEGATIVE
Glucose, UA: NEGATIVE mg/dL
KETONES UR: NEGATIVE mg/dL
LEUKOCYTES UA: NEGATIVE
NITRITE: NEGATIVE
Protein, ur: NEGATIVE mg/dL
Specific Gravity, Urine: 1.015 (ref 1.005–1.030)
Urobilinogen, UA: 0.2 mg/dL (ref 0.0–1.0)
pH: 5 (ref 5.0–8.0)

## 2018-04-23 LAB — COMPREHENSIVE METABOLIC PANEL
ALT: 15 U/L (ref 0–44)
AST: 17 U/L (ref 15–41)
Albumin: 3.7 g/dL (ref 3.5–5.0)
Alkaline Phosphatase: 59 U/L (ref 38–126)
Anion gap: 12 (ref 5–15)
BUN: 22 mg/dL (ref 8–23)
CO2: 24 mmol/L (ref 22–32)
Calcium: 10.2 mg/dL (ref 8.9–10.3)
Chloride: 98 mmol/L (ref 98–111)
Creatinine, Ser: 1.09 mg/dL — ABNORMAL HIGH (ref 0.44–1.00)
GFR calc Af Amer: 52 mL/min — ABNORMAL LOW (ref >60)
GFR calc non Af Amer: 45 mL/min — ABNORMAL LOW (ref >60)
Glucose, Bld: 105 mg/dL — ABNORMAL HIGH (ref 70–99)
Potassium: 3.5 mmol/L (ref 3.5–5.1)
Sodium: 134 mmol/L — ABNORMAL LOW (ref 135–145)
Total Bilirubin: 2 mg/dL — ABNORMAL HIGH (ref 0.3–1.2)
Total Protein: 7.9 g/dL (ref 6.5–8.1)

## 2018-04-23 LAB — GLUCOSE, CAPILLARY: GLUCOSE-CAPILLARY: 95 mg/dL (ref 70–99)

## 2018-04-23 LAB — LIPASE, BLOOD: LIPASE: 26 U/L (ref 11–51)

## 2018-04-23 MED ORDER — ACETAMINOPHEN 650 MG RE SUPP: 650.0000 mg | Freq: Four times a day (QID) | RECTAL | Status: DC | PRN

## 2018-04-23 MED ORDER — LOSARTAN POTASSIUM 25 MG PO TABS
25.0000 mg | ORAL_TABLET | Freq: Every day | ORAL | Status: DC
  Administered 2018-04-24 – 2018-04-25 (×2): 25 mg via ORAL
  Filled 2018-04-23 (×2): qty 1

## 2018-04-23 MED ORDER — VITAMIN D 25 MCG (1000 UNIT) PO TABS
1000.0000 [IU] | ORAL_TABLET | Freq: Every day | ORAL | Status: DC
  Administered 2018-04-24 – 2018-04-27 (×4): 1000 [IU] via ORAL
  Filled 2018-04-23 (×4): qty 1

## 2018-04-23 MED ORDER — ENOXAPARIN SODIUM 40 MG/0.4ML ~~LOC~~ SOLN
40.0000 mg | SUBCUTANEOUS | Status: DC
  Administered 2018-04-24: 40 mg via SUBCUTANEOUS
  Filled 2018-04-23 (×2): qty 0.4

## 2018-04-23 MED ORDER — IOHEXOL 300 MG/ML  SOLN
97.0000 mL | Freq: Once | INTRAMUSCULAR | Status: AC | PRN
  Administered 2018-04-23: 97 mL via INTRAVENOUS

## 2018-04-23 MED ORDER — LACTATED RINGERS IV BOLUS
500.0000 mL | Freq: Once | INTRAVENOUS | Status: AC
  Administered 2018-04-23: 500 mL via INTRAVENOUS

## 2018-04-23 MED ORDER — ONDANSETRON HCL 4 MG PO TABS: 4.0000 mg | ORAL_TABLET | Freq: Four times a day (QID) | ORAL | Status: DC | PRN

## 2018-04-23 MED ORDER — LEVOTHYROXINE SODIUM 75 MCG PO TABS
75.0000 ug | ORAL_TABLET | Freq: Every day | ORAL | Status: DC
  Administered 2018-04-24 – 2018-04-27 (×4): 75 ug via ORAL
  Filled 2018-04-23 (×4): qty 1

## 2018-04-23 MED ORDER — ONDANSETRON HCL 4 MG/2ML IJ SOLN: 4.0000 mg | Freq: Four times a day (QID) | INTRAMUSCULAR | Status: DC | PRN

## 2018-04-23 MED ORDER — PIPERACILLIN-TAZOBACTAM 3.375 G IVPB 30 MIN
3.3750 g | Freq: Once | INTRAVENOUS | Status: AC
  Administered 2018-04-23: 3.375 g via INTRAVENOUS
  Filled 2018-04-23: qty 50

## 2018-04-23 MED ORDER — TRIAMTERENE-HCTZ 75-50 MG PO TABS
0.5000 | ORAL_TABLET | Freq: Every day | ORAL | Status: DC
  Administered 2018-04-24 – 2018-04-25 (×2): 0.5 via ORAL
  Filled 2018-04-23 (×2): qty 0.5

## 2018-04-23 MED ORDER — INSULIN ASPART 100 UNIT/ML ~~LOC~~ SOLN: 0.0000 [IU] | Freq: Three times a day (TID) | SUBCUTANEOUS | Status: DC

## 2018-04-23 MED ORDER — ATORVASTATIN CALCIUM 10 MG PO TABS
5.0000 mg | ORAL_TABLET | Freq: Every day | ORAL | Status: DC
  Administered 2018-04-24 – 2018-04-26 (×3): 5 mg via ORAL
  Filled 2018-04-23 (×4): qty 1

## 2018-04-23 MED ORDER — ASPIRIN EC 81 MG PO TBEC
81.0000 mg | DELAYED_RELEASE_TABLET | Freq: Every day | ORAL | Status: DC
  Administered 2018-04-24 – 2018-04-27 (×4): 81 mg via ORAL
  Filled 2018-04-23 (×4): qty 1

## 2018-04-23 MED ORDER — OMEGA-3-ACID ETHYL ESTERS 1 G PO CAPS
1.0000 | ORAL_CAPSULE | Freq: Every day | ORAL | Status: DC
  Administered 2018-04-24 – 2018-04-27 (×4): 1 g via ORAL
  Filled 2018-04-23 (×5): qty 1

## 2018-04-23 MED ORDER — ACETAMINOPHEN 325 MG PO TABS
650.0000 mg | ORAL_TABLET | Freq: Four times a day (QID) | ORAL | Status: DC | PRN
  Administered 2018-04-25 – 2018-04-26 (×2): 650 mg via ORAL
  Filled 2018-04-23 (×2): qty 2

## 2018-04-23 MED ORDER — PIPERACILLIN-TAZOBACTAM 3.375 G IVPB
3.3750 g | Freq: Three times a day (TID) | INTRAVENOUS | Status: DC
  Administered 2018-04-24 – 2018-04-26 (×8): 3.375 g via INTRAVENOUS
  Filled 2018-04-23 (×8): qty 50

## 2018-04-23 MED ORDER — TIMOLOL MALEATE 0.5 % OP SOLN
1.0000 [drp] | Freq: Two times a day (BID) | OPHTHALMIC | Status: DC
  Administered 2018-04-23 – 2018-04-27 (×8): 1 [drp] via OPHTHALMIC
  Filled 2018-04-23 (×3): qty 5

## 2018-04-23 NOTE — Progress Notes (Signed)
Patient arrived to floor from ED. Alert and oriented x4.

## 2018-04-23 NOTE — H&P (Signed)
History and Physical    Jade [name] ZSW:[id number] DOB: [birth year] DOA: 04/23/2018  PCP: Deland Pretty, MD  Patient coming from: Home   I have personally briefly reviewed patient's old medical records in [place]  Chief Complaint: LLQ abd pain  HPI: Jade [name] is a 84 y.o. female with medical history significant of diverticulitis, DM2, HTN.  Patient presents to the ED with 1 day history of nausea, LLQ abd pain / flank pain.  Similar to prior bouts of diverticulitis.  No diarrhea, no urinary symptoms.  No melena nor hematochezia.   ED Course: Tm 100.0.  WBC 18.4k.  CT shows diverticulitis with questionable intramural abscess.  Started on zosyn in ED.   Review of Systems: As per HPI otherwise 10 point review of systems negative.   Past Medical History:  Diagnosis Date  . Allergic rhinitis   . Bronchitis   . Cataract   . CKD (chronic kidney disease)    stage 3 kidney failure per pt  . Diabetes mellitus   . Diverticulosis 11/2010  . GERD (gastroesophageal reflux disease)   . Glaucoma   . Glaucoma   . Hyperlipidemia   . Hypertension   . Hypothyroid   . Internal hemorrhoids   . Tubular adenoma polyp of rectum 02/2002  . Type 2 diabetes mellitus ([place])     Past Surgical History:  Procedure Laterality Date  . APPENDECTOMY    . BREAST CYST EXCISION    . CHOLECYSTECTOMY    . COLONOSCOPY    . DIVERTICULITIS  2004   SURGERY FOR DIVERTICULITIS PER PT.  Marland Kitchen HERNIA REPAIR     w/ mesh  . POLYPECTOMY    . TONSILLECTOMY    . TOTAL ABDOMINAL HYSTERECTOMY W/ BILATERAL SALPINGOOPHORECTOMY       reports that she has never smoked. She has never used smokeless tobacco. She reports that she does not drink alcohol or use drugs.  Allergies  Allergen Reactions  . Codeine   . Miralax [Polyethylene Glycol] Swelling    Feet swelling  . Pentazocine Lactate   . Tramadol Hcl     Family History  Problem Relation Age of Onset  . Lung cancer Father   . Heart  disease Mother   . Heart disease Brother   . Diabetes Maternal Aunt   . Diabetes Maternal Uncle   . Diabetes Brother   . Colon cancer Neg Hx      Prior to Admission medications   Medication Sig Start Date End Date Taking? Authorizing Provider  aspirin 81 MG tablet Take 81 mg by mouth daily.     Yes [provider]  atorvastatin (LIPITOR) 10 MG tablet Take 5 mg by mouth daily at 6 PM.  12/15/10  Yes [provider]  Cholecalciferol (VITAMIN D) 1000 UNITS capsule Take 1,000 Units by mouth daily.     Yes [provider]  Coenzyme Q10 (COQ10) 50 MG CAPS Take 1 capsule by mouth daily.     Yes [provider]  fish oil-omega-3 fatty acids 1000 MG capsule Take 1 capsule by mouth daily.     Yes [provider]  levothyroxine (SYNTHROID, LEVOTHROID) 75 MCG tablet Take 75 mcg by mouth daily.     Yes [provider]  losartan (COZAAR) 50 MG tablet Take 25 mg by mouth daily.    Yes [provider]  metFORMIN (GLUCOPHAGE) 500 MG tablet Take 500 mg by mouth daily with breakfast.    Yes [provider]  timolol (TIMOPTIC) 0.5 % ophthalmic solution Place 1 drop into both eyes 2 (two) times daily.  08/26/14  Yes [provider]  triamterene-hydrochlorothiazide (MAXZIDE) 75-50 MG per tablet Take 0.5 tablets by mouth daily.    Yes [provider]  FREESTYLE LITE test strip TEST QAM 09/13/14   [provider]    Physical Exam: Vitals:   04/23/18 1619 04/23/18 1715 04/23/18 1924 04/23/18 1930  BP: (!) 148/77 (!) 119/45 133/85 (!) 153/68  Pulse: 94 95 92 85  Resp: 18 20 (!) 25 16  Temp: 99.3 F (37.4 C)  99.4 F (37.4 C)   TempSrc: Oral  Oral   SpO2: 97% 98% 95% 94%    Constitutional: NAD, calm, comfortable Eyes: PERRL, lids and conjunctivae normal ENMT: Mucous membranes are moist. Posterior pharynx clear of any exudate or lesions.Normal dentition.  Neck: normal, supple, no masses, no  thyromegaly Respiratory: clear to auscultation bilaterally, no wheezing, no crackles. Normal respiratory effort. No accessory muscle use.  Cardiovascular: Regular rate and rhythm, no murmurs / rubs / gallops. No extremity edema. 2+ pedal pulses. No carotid bruits.  Abdomen: LLQ TTP Musculoskeletal: no clubbing / cyanosis. No joint deformity upper and lower extremities. Good ROM, no contractures. Normal muscle tone.  Skin: no rashes, lesions, ulcers. No induration Neurologic: CN 2-12 grossly intact. Sensation intact, DTR normal. Strength 5/5 in all 4.  Psychiatric: Normal judgment and insight. Alert and oriented x 3. Normal mood.    Labs on Admission: I have personally reviewed following labs and imaging studies  CBC: Recent Labs  Lab 04/23/18 1710  WBC 18.4*  NEUTROABS 15.8*  HGB 12.0  HCT 37.2  MCV 93.7  PLT 700   Basic Metabolic Panel: Recent Labs  Lab 04/23/18 1710  NA 134*  K 3.5  CL 98  CO2 24  GLUCOSE 105*  BUN 22  CREATININE 1.09*  CALCIUM 10.2   GFR: CrCl cannot be calculated (Unknown ideal weight.). Liver Function Tests: Recent Labs  Lab 04/23/18 1710  AST 17  ALT 15  ALKPHOS 59  BILITOT 2.0*  PROT 7.9  ALBUMIN 3.7   Recent Labs  Lab 04/23/18 1710  LIPASE 26   No results for input(s): AMMONIA in the last 168 hours. Coagulation Profile: No results for input(s): INR, PROTIME in the last 168 hours. Cardiac Enzymes: No results for input(s): CKTOTAL, CKMB, CKMBINDEX, TROPONINI in the last 168 hours. BNP (last 3 results) No results for input(s): PROBNP in the last 8760 hours. HbA1C: No results for input(s): HGBA1C in the last 72 hours. CBG: No results for input(s): GLUCAP in the last 168 hours. Lipid Profile: No results for input(s): CHOL, HDL, LDLCALC, TRIG, CHOLHDL, LDLDIRECT in the last 72 hours. Thyroid Function Tests: No results for input(s): TSH, T4TOTAL, FREET4, T3FREE, THYROIDAB in the last 72 hours. Anemia Panel: No results for  input(s): VITAMINB12, FOLATE, FERRITIN, TIBC, IRON, RETICCTPCT in the last 72 hours. Urine analysis:    Component Value Date/Time   LABSPEC 1.015 04/23/2018 1516   PHURINE 5.0 04/23/2018 1516   GLUCOSEU NEGATIVE 04/23/2018 1516   HGBUR TRACE (A) 04/23/2018 1516   BILIRUBINUR NEGATIVE 04/23/2018 1516   BILIRUBINUR negative 09/26/2017 1758   KETONESUR NEGATIVE 04/23/2018 1516   PROTEINUR NEGATIVE 04/23/2018 1516   UROBILINOGEN 0.2 04/23/2018 1516   NITRITE NEGATIVE 04/23/2018 1516   LEUKOCYTESUR NEGATIVE 04/23/2018 1516    Radiological Exams on Admission: Ct Abdomen Pelvis W Contrast  Result Date: 04/23/2018 CLINICAL DATA:  83 year old presenting with acute onset  of LEFT flank pain associated with nausea that began last night. Surgical history includes cholecystectomy, appendectomy, TAH-BSO, and unspecified hernia repair. Personal history of tubular adenoma of the rectum. EXAM: CT ABDOMEN AND PELVIS WITH CONTRAST TECHNIQUE: Multidetector CT imaging of the abdomen and pelvis was performed using the standard protocol following bolus administration of intravenous contrast. CONTRAST:  28mL OMNIPAQUE IOHEXOL 300 MG/ML IV. COMPARISON:  03/15/2018, 07/15/2017 and earlier. FINDINGS: Lower chest: Minimal scar and bronchiectasis involving the lower lobes. Visualized lung bases otherwise clear. Heart size upper normal with three-vessel coronary atherosclerosis. Hepatobiliary: Liver normal in size and appearance. Surgically absent gallbladder. Mild intra and extrahepatic biliary ductal dilation without evidence of obstructing stone or mass. Pancreas: Normal in appearance without evidence of mass, ductal dilation, or inflammation. Spleen: Normal in size and appearance. Adrenals/Urinary Tract: Normal appearing adrenal glands. Multiple BILATERAL renal cysts as noted previously. No solid renal masses. No evidence of hydronephrosis involving either kidney. No urinary tract calculi on either side. Normal appearing  urinary bladder. Stomach/Bowel: Normal appearing decompressed stomach. Normal-appearing small bowel. Extensive sigmoid colon diverticulosis with evidence of acute diverticulitis involving the distal sigmoid colon, possibly with a small intramural abscess in the distal sigmoid colon. No evidence of extra colonic abscess. No free intraperitoneal air to suggest microperforation. Appendix surgically absent. Vascular/Lymphatic: Severe aortoiliofemoral atherosclerosis without evidence of aneurysm. Visceral arteries atherosclerotic though patent. Normal-appearing portal venous and systemic venous systems. No pathologic lymphadenopathy. Reproductive: Surgically absent uterus and ovaries. No adnexal masses. Other: None. Musculoskeletal: LOWER thoracic spondylosis and DISH. Multilevel degenerative disc disease, spondylosis and facet degenerative changes throughout the lumbar spine with degenerative grade 1 spondylolisthesis of L4 on L5 measuring approximately 8 mm. No acute findings. IMPRESSION: 1. Acute diverticulitis involving the distal sigmoid colon, possibly with a small intramural abscess involving the distal sigmoid colon. No evidence of extracolonic abscess or microperforation. 2. No acute abnormalities otherwise involving the abdomen or pelvis. 3.  Aortic Atherosclerosis, severe.  (ICD10-170.0) Electronically Signed   By: Evangeline Dakin M.D.   On: 04/23/2018 19:37    EKG: Independently reviewed.  Assessment/Plan Principal Problem:   Acute diverticulitis Active Problems:   DM2 (diabetes mellitus, type 2) ([place])   Essential hypertension    1. Diverticulitis - 1. Will leave on zosyn overnight 2. Uncomplicated except for the questionable mural abscess 3. Has had complicated diverticulitis in past requiring OR. 4. Will let patient eat. 5. Zofran PRN nausea 6. Repeat CBC/BMP in AM 2. DM2 - 1. Hold metformin 2. SSI sensitive AC 3. HTN - continue home BP meds  DVT prophylaxis: Lovenox Code Status:  Full Family Communication: No family in room Disposition Plan: Home after admit Consults called: None Admission status: Place in obs    [place], [place] [facility] Pager [phone] Only works nights!  If 7AM-7PM, please contact the primary day team physician taking care of patient  [email or web address] Password [email or web address]  04/23/2018, 8:40 PM

## 2018-04-23 NOTE — ED Triage Notes (Signed)
Patient to ED from [facility] c/o L flank pain onset last night with nausea and low-grade fever. Patient denies diarrhea or urinary symptoms.

## 2018-04-23 NOTE — Discharge Instructions (Signed)
Go to the Emergency department °

## 2018-04-23 NOTE — ED Provider Notes (Signed)
[facility] Provider Note   CSN: [id number] Arrival date & time: 04/23/18  1612     History   Chief Complaint Chief Complaint  Patient presents with  . Flank Pain    HPI Jade [name] is a 84 y.o. female.  The history is provided by the patient. No language interpreter was used.     Patient is an 84 year old female with a past medical history of [name], [facility] [name], diverticulosis, GERD, hyperlipidemia, hypertension, hypothyroidism, and tubular adenoma polyp of the rectum who presents for evaluation of 1 day of left-sided flank pain and left lower quadrant abdominal pain.  Patient denies any vomiting or diarrhea but does endorse some nausea with this.  Patient denies any headaches, fevers, cough, shortness of breath, chest pain, blood in her stool, blood in her urine, extremity pain, rash, or other acute complaints.  Denies alleviating or aggravating factors.  Endorse prior similar episodes when she was diagnosed with diverticulitis.  Denies any recent falls or traumatic injuries.  Past Medical History:  Diagnosis Date  . Allergic rhinitis   . Bronchitis   . Cataract   . [name] (chronic kidney disease)    stage 3 kidney failure per pt  . Diabetes mellitus   . Diverticulosis 11/2010  . GERD (gastroesophageal reflux disease)   . Glaucoma   . Glaucoma   . Hyperlipidemia   . Hypertension   . Hypothyroid   . Internal hemorrhoids   . Tubular adenoma polyp of rectum 02/2002  . Type 2 diabetes mellitus [facility])     Patient Active Problem List   Diagnosis Date Noted  . Acute diverticulitis 04/23/2018  . Rectal bleeding 07/24/2013  . DYSPNEA ON EXERTION 02/03/2010  . HYPOTHYROIDISM 08/07/2008  . DM2 (diabetes mellitus, type 2) ([place]) 08/07/2008  . Essential hypertension 08/07/2008  . Seasonal and perennial allergic rhinitis 08/09/2007  . BRONCHITIS 08/09/2007    Past Surgical History:  Procedure Laterality Date  . APPENDECTOMY    . BREAST CYST  EXCISION    . CHOLECYSTECTOMY    . COLONOSCOPY    . DIVERTICULITIS  2004   SURGERY FOR DIVERTICULITIS PER PT.  Marland Kitchen HERNIA REPAIR     w/ mesh  . POLYPECTOMY    . TONSILLECTOMY    . TOTAL ABDOMINAL HYSTERECTOMY W/ BILATERAL SALPINGOOPHORECTOMY       OB History   No obstetric history on file.      Home Medications    Prior to Admission medications   Medication Sig Start Date End Date Taking? Authorizing Provider  aspirin 81 MG tablet Take 81 mg by mouth daily.     Yes [provider]  atorvastatin (LIPITOR) 10 MG tablet Take 5 mg by mouth daily at 6 PM.  12/15/10  Yes [provider]  Cholecalciferol (VITAMIN D) 1000 UNITS capsule Take 1,000 Units by mouth daily.     Yes [provider]  Coenzyme Q10 (COQ10) 50 MG CAPS Take 1 capsule by mouth daily.     Yes [provider]  fish oil-omega-3 fatty acids 1000 MG capsule Take 1 capsule by mouth daily.     Yes [provider]  levothyroxine (SYNTHROID, LEVOTHROID) 75 MCG tablet Take 75 mcg by mouth daily.     Yes [provider]  losartan (COZAAR) 50 MG tablet Take 25 mg by mouth daily.    Yes [provider]  metFORMIN (GLUCOPHAGE) 500 MG tablet Take 500 mg by mouth daily with breakfast.  Yes [provider]  timolol (TIMOPTIC) 0.5 % ophthalmic solution Place 1 drop into both eyes 2 (two) times daily.  08/26/14  Yes [provider]  triamterene-hydrochlorothiazide (MAXZIDE) 75-50 MG per tablet Take 0.5 tablets by mouth daily.    Yes [provider]  FREESTYLE LITE test strip TEST QAM 09/13/14   [provider]    Family History Family History  Problem Relation Age of Onset  . Lung cancer Father   . Heart disease Mother   . Heart disease Brother   . Diabetes Maternal Aunt   . Diabetes Maternal Uncle   . Diabetes Brother   . Colon cancer Neg Hx     Social History Social History   Tobacco Use  . Smoking status: Never Smoker  .  Smokeless tobacco: Never Used  Substance Use Topics  . Alcohol use: No  . Drug use: No     Allergies   Codeine; Miralax [polyethylene glycol]; Pentazocine lactate; and Tramadol hcl   Review of Systems Review of Systems  Constitutional: Negative for chills and fever.  HENT: Negative for ear pain and sore throat.   Eyes: Negative for pain and visual disturbance.  Respiratory: Negative for cough and shortness of breath.   Cardiovascular: Negative for chest pain and palpitations.  Gastrointestinal: Positive for abdominal pain and nausea. Negative for vomiting.  Genitourinary: Negative for dysuria and hematuria.  Musculoskeletal: Negative for arthralgias and back pain.  Skin: Negative for color change and rash.  Neurological: Negative for seizures and syncope.  All other systems reviewed and are negative.    Physical Exam Updated Vital Signs BP (!) 153/68   Pulse 85   Temp 99.4 F (37.4 C) (Oral)   Resp 16   SpO2 94%   Physical Exam Vitals signs and nursing note reviewed.  Constitutional:      General: She is not in acute distress.    Appearance: Normal appearance. She is well-developed and normal weight.  HENT:     Head: Normocephalic and atraumatic.     Right Ear: External ear normal.     Left Ear: External ear normal.     Nose: Nose normal.     Mouth/Throat:     Mouth: Mucous membranes are moist.  Eyes:     Conjunctiva/sclera: Conjunctivae normal.  Neck:     Musculoskeletal: Neck supple.  Cardiovascular:     Rate and Rhythm: Normal rate and regular rhythm.     Pulses: Normal pulses.     Heart sounds: No murmur.  Pulmonary:     Effort: Pulmonary effort is normal. No respiratory distress.     Breath sounds: Normal breath sounds.  Abdominal:     Palpations: Abdomen is soft.     Tenderness: There is abdominal tenderness in the left lower quadrant.  Skin:    General: Skin is warm and dry.     Capillary Refill: Capillary refill takes less than 2 seconds.    Neurological:     General: No focal deficit present.     Mental Status: She is alert.      ED Treatments / Results  Labs (all labs ordered are listed, but only abnormal results are displayed) Labs Reviewed  CBC WITH DIFFERENTIAL/PLATELET - Abnormal; Notable for the following components:      Result Value   WBC 18.4 (*)    Neutro Abs 15.8 (*)    Monocytes Absolute 1.1 (*)    Abs Immature Granulocytes 0.14 (*)    All other  components within normal limits  COMPREHENSIVE METABOLIC PANEL - Abnormal; Notable for the following components:   Sodium 134 (*)    Glucose, Bld 105 (*)    Creatinine, Ser 1.09 (*)    Total Bilirubin 2.0 (*)    GFR calc non Af Amer 45 (*)    GFR calc Af Amer 52 (*)    All other components within normal limits  CULTURE, BLOOD (ROUTINE X 2)  CULTURE, BLOOD (ROUTINE X 2)  LIPASE, BLOOD  URINALYSIS, ROUTINE W REFLEX MICROSCOPIC  CBC  BASIC METABOLIC PANEL    EKG EKG Interpretation  Date/Time:  Sunday April 23 2018 17:14:35 EST Ventricular Rate:  95 PR Interval:    QRS Duration: 95 QT Interval:  351 QTC Calculation: 442 R Axis:   -13 Text Interpretation:  Sinus rhythm Prolonged PR interval Borderline repolarization abnormality rate faster otherwise similar to previous Confirmed by Theotis Burrow [phone]) on 04/23/2018 5:49:40 PM   Radiology Ct Abdomen Pelvis W Contrast  Result Date: 04/23/2018 CLINICAL DATA:  84 year old presenting with acute onset of LEFT flank pain associated with nausea that began last night. Surgical history includes cholecystectomy, appendectomy, TAH-BSO, and unspecified hernia repair. Personal history of tubular adenoma of the rectum. EXAM: CT ABDOMEN AND PELVIS WITH CONTRAST TECHNIQUE: Multidetector CT imaging of the abdomen and pelvis was performed using the standard protocol following bolus administration of intravenous contrast. CONTRAST:  77m OMNIPAQUE IOHEXOL 300 MG/ML IV. COMPARISON:  03/15/2018, 07/15/2017 and  earlier. FINDINGS: Lower chest: Minimal scar and bronchiectasis involving the lower lobes. Visualized lung bases otherwise clear. Heart size upper normal with three-vessel coronary atherosclerosis. Hepatobiliary: Liver normal in size and appearance. Surgically absent gallbladder. Mild intra and extrahepatic biliary ductal dilation without evidence of obstructing stone or mass. Pancreas: Normal in appearance without evidence of mass, ductal dilation, or inflammation. Spleen: Normal in size and appearance. Adrenals/Urinary Tract: Normal appearing adrenal glands. Multiple BILATERAL renal cysts as noted previously. No solid renal masses. No evidence of hydronephrosis involving either kidney. No urinary tract calculi on either side. Normal appearing urinary bladder. Stomach/Bowel: Normal appearing decompressed stomach. Normal-appearing small bowel. Extensive sigmoid colon diverticulosis with evidence of acute diverticulitis involving the distal sigmoid colon, possibly with a small intramural abscess in the distal sigmoid colon. No evidence of extra colonic abscess. No free intraperitoneal air to suggest microperforation. Appendix surgically absent. Vascular/Lymphatic: Severe aortoiliofemoral atherosclerosis without evidence of aneurysm. Visceral arteries atherosclerotic though patent. Normal-appearing portal venous and systemic venous systems. No pathologic lymphadenopathy. Reproductive: Surgically absent uterus and ovaries. No adnexal masses. Other: None. Musculoskeletal: LOWER thoracic spondylosis and DISH. Multilevel degenerative disc disease, spondylosis and facet degenerative changes throughout the lumbar spine with degenerative grade 1 spondylolisthesis of L4 on L5 measuring approximately 8 mm. No acute findings. IMPRESSION: 1. Acute diverticulitis involving the distal sigmoid colon, possibly with a small intramural abscess involving the distal sigmoid colon. No evidence of extracolonic abscess or microperforation.  2. No acute abnormalities otherwise involving the abdomen or pelvis. 3.  Aortic Atherosclerosis, severe.  (ICD10-170.0) Electronically Signed   By: TEvangeline DakinM.D.   On: 04/23/2018 19:37    Procedures Procedures (including critical care time)  Medications Ordered in ED Medications  acetaminophen (TYLENOL) tablet 650 mg (has no administration in time range)    Or  acetaminophen (TYLENOL) suppository 650 mg (has no administration in time range)  ondansetron (ZOFRAN) tablet 4 mg (has no administration in time range)    Or  ondansetron (ZOFRAN) injection 4 mg (has no administration in  time range)  piperacillin-tazobactam (ZOSYN) IVPB 3.375 g (has no administration in time range)  aspirin tablet 81 mg (has no administration in time range)  atorvastatin (LIPITOR) tablet 5 mg (has no administration in time range)  Vitamin D 1,000 Units (has no administration in time range)  fish oil-omega-3 fatty acids capsule 1 g (has no administration in time range)  enoxaparin (LOVENOX) injection 40 mg (has no administration in time range)  levothyroxine (SYNTHROID, LEVOTHROID) tablet 75 mcg (has no administration in time range)  losartan (COZAAR) tablet 25 mg (has no administration in time range)  triamterene-hydrochlorothiazide (MAXZIDE) 75-50 MG per tablet 0.5 tablet (has no administration in time range)  timolol (TIMOPTIC) 0.5 % ophthalmic solution 1 drop (has no administration in time range)  insulin aspart (novoLOG) injection 0-9 Units (has no administration in time range)  iohexol (OMNIPAQUE) 300 MG/ML solution 97 mL (97 mLs Intravenous Contrast Given 04/23/18 1855)  lactated ringers bolus 500 mL (500 mLs Intravenous New Bag/Given 04/23/18 2005)  piperacillin-tazobactam (ZOSYN) IVPB 3.375 g (0 g Intravenous Stopped 04/23/18 2048)     Initial Impression / Assessment and Plan / ED Course  I have reviewed the triage vital signs and the nursing notes.  Pertinent labs & imaging results that were  available during my care of the patient were reviewed by me and considered in my medical decision making (see chart for details).     Patient is an 84 year old female who presents above-stated history exam.  On presentation patient is afebrile stable vital signs.  Exam as above remarkable for tenderness palpation in the left lower quadrant.  Doubt acute pancreatitis given lipase of 26.  Doubt hepatobiliary etiology given absence of tenderness palpation of the right upper quadrant and AST/ALT/alk phos WNL.  CMP otherwise remarkable for mildly decreased kidney function with a creatinine of 1.09 and a GFR of 45 but otherwise no significant abnormalities.  CBC remarkable for WBC of 18.4 with hemoglobin of 12 and platelets of 242.  Point-of-care urine dipstick does not appear to have infected urine.  CT abdomen pelvis with contrast was obtained that was remarkable for "Acute diverticulitis involving the distal sigmoid colon, possibly with a small intramural abscess involving the distal sigmoid colon. No evidence of extracolonic abscess or microperforation. No acute abnormalities otherwise involving the abdomen or pelvis. Aortic Atherosclerosis, severe."  Given concern for diverticulitis on CT patient was started on IV antibiotics and given IV fluids.  CT does not show any findings concerning for appendicitis, perforated viscus, or other acute life-threatening intra-abdominal pathology.  Patient admitted to hospital service in stable condition.  Final Clinical Impressions(s) / ED Diagnoses   Final diagnoses:  Diverticulitis of colon  Lymphocytosis    ED Discharge Orders    None       Hulan Saas, MD 04/23/18 2121    Rex Kras Wenda Overland, MD 04/25/18 [phone]

## 2018-04-23 NOTE — ED Provider Notes (Signed)
[facility]    CSN: [id number] Arrival date & time: 04/23/18  1353     History   Chief Complaint Chief Complaint  Patient presents with  . Left Flank Pain  . Nausea  . Back Pain    HPI Jade [name] is a 84 y.o. female.   The history is provided by the patient. No language interpreter was used.  Abdominal Pain  Pain location:  LLQ Pain quality: aching   Pain radiates to:  Does not radiate Pain severity:  Moderate Onset quality:  Gradual Pt reports she has had similar pain in the past with diverticulitis.  Pt reports she also has pain in her back   Past Medical History:  Diagnosis Date  . Allergic rhinitis   . Bronchitis   . Cataract   . CKD (chronic kidney disease)    stage 3 kidney failure per pt  . Diabetes mellitus   . Diverticulosis 11/2010  . GERD (gastroesophageal reflux disease)   . Glaucoma   . Glaucoma   . Hyperlipidemia   . Hypertension   . Hypothyroid   . Internal hemorrhoids   . Tubular adenoma polyp of rectum 02/2002  . Type 2 diabetes mellitus [facility])     Patient Active Problem List   Diagnosis Date Noted  . Rectal bleeding 07/24/2013  . DYSPNEA ON EXERTION 02/03/2010  . HYPOTHYROIDISM 08/07/2008  . DIABETES, TYPE 2 08/07/2008  . HYPERTENSION 08/07/2008  . Seasonal and perennial allergic rhinitis 08/09/2007  . BRONCHITIS 08/09/2007    Past Surgical History:  Procedure Laterality Date  . APPENDECTOMY    . BREAST CYST EXCISION    . CHOLECYSTECTOMY    . COLONOSCOPY    . DIVERTICULITIS  2004   SURGERY FOR DIVERTICULITIS PER PT.  Marland Kitchen HERNIA REPAIR     w/ mesh  . POLYPECTOMY    . TONSILLECTOMY    . TOTAL ABDOMINAL HYSTERECTOMY W/ BILATERAL SALPINGOOPHORECTOMY      OB History   No obstetric history on file.      Home Medications    Prior to Admission medications   Medication Sig Start Date End Date Taking? Authorizing Provider  amoxicillin-clavulanate (AUGMENTIN) 875-125 MG tablet Take 1 tablet by mouth 2 (two)  times daily. 03/20/18   Willia Craze, NP  aspirin 81 MG tablet Take 81 mg by mouth daily.      [provider]  atorvastatin (LIPITOR) 10 MG tablet Take 5 mg by mouth daily.  12/15/10   [provider]  Cholecalciferol (VITAMIN D) 1000 UNITS capsule Take 1,000 Units by mouth daily.      [provider]  Coenzyme Q10 (COQ10) 50 MG CAPS Take 1 capsule by mouth daily.      [provider]  fish oil-omega-3 fatty acids 1000 MG capsule Take 1 capsule by mouth daily.      [provider]  FREESTYLE LITE test strip TEST QAM 09/13/14   [provider]  levothyroxine (SYNTHROID, LEVOTHROID) 75 MCG tablet Take 75 mcg by mouth daily.      [provider]  losartan (COZAAR) 50 MG tablet Take 25 mg by mouth daily.     [provider]  metFORMIN (GLUCOPHAGE) 500 MG tablet Take 500 mg by mouth 2 (two) times daily with a meal.     [provider]  Rivaroxaban (XARELTO) 15 MG TABS tablet Take 1 tablet (15 mg total) by mouth 2 (two) times daily with a meal. Patient not taking: Reported  on 03/08/2018 09/26/17   Tenna Delaine D, PA-C  timolol (TIMOPTIC) 0.5 % ophthalmic solution INT 1 GTT INTO OU D 08/26/14   [provider]  triamterene-hydrochlorothiazide (MAXZIDE) 75-50 MG per tablet Take 0.5 tablets by mouth daily.     [provider]    Family History Family History  Problem Relation Age of Onset  . Lung cancer Father   . Heart disease Mother   . Heart disease Brother   . Diabetes Maternal Aunt   . Diabetes Maternal Uncle   . Diabetes Brother   . Colon cancer Neg Hx     Social History Social History   Tobacco Use  . Smoking status: Never Smoker  . Smokeless tobacco: Never Used  Substance Use Topics  . Alcohol use: No  . Drug use: No     Allergies   Codeine; Miralax [polyethylene glycol]; Pentazocine lactate; and Tramadol hcl   Review of Systems Review of Systems  Gastrointestinal:  Positive for abdominal pain.  All other systems reviewed and are negative.    Physical Exam Triage Vital Signs ED Triage Vitals  Enc Vitals Group     BP 04/23/18 1459 137/65     Pulse Rate 04/23/18 1459 91     Resp 04/23/18 1459 18     Temp 04/23/18 1459 100 F (37.8 C)     Temp Source 04/23/18 1459 Oral     SpO2 04/23/18 1459 96 %     Weight --      Height --      Head Circumference --      Peak Flow --      Pain Score 04/23/18 1501 7     Pain Loc --      Pain Edu? --      Excl. in [place]? --    No data found.  Updated Vital Signs BP 137/65 (BP Location: Left Arm)   Pulse 91   Temp 100 F (37.8 C) (Oral)   Resp 18   SpO2 96%   Visual Acuity Right Eye Distance:   Left Eye Distance:   Bilateral Distance:    Right Eye Near:   Left Eye Near:    Bilateral Near:     Physical Exam Vitals signs reviewed.  HENT:     Head: Normocephalic.     Nose: Nose normal.  Neck:     Musculoskeletal: Normal range of motion.  Cardiovascular:     Rate and Rhythm: Normal rate and regular rhythm.  Pulmonary:     Effort: Pulmonary effort is normal.     Breath sounds: Normal breath sounds.  Abdominal:     General: Abdomen is flat.     Tenderness: There is abdominal tenderness.     Comments: Tender left lower quadrant   Neurological:     Mental Status: She is alert.      UC Treatments / Results  Labs (all labs ordered are listed, but only abnormal results are displayed) Labs Reviewed  POCT URINALYSIS DIP (DEVICE) - Abnormal; Notable for the following components:      Result Value   Hgb urine dipstick TRACE (*)    All other components within normal limits    EKG None  Radiology No results found.  Procedures Procedures (including critical care time)  Medications Ordered in UC Medications - No data to display  Initial Impression / Assessment and Plan / UC Course  I have reviewed the triage vital signs and the nursing notes.  Pertinent labs &  imaging results that  were available during my care of the patient were reviewed by me and considered in my medical decision making (see chart for details).     MDM  Temp 100.  I will send pt to ED for evaluation.  Pt may need a ct scan  Final Clinical Impressions(s) / UC Diagnoses   Final diagnoses:  Abdominal pain, left lower quadrant     Discharge Instructions     Go to the Emergency department    ED Prescriptions    None     Controlled Substance Prescriptions [place] Controlled Substance Registry consulted? Not Applicable   Fransico Meadow, [place] 04/23/18 1552

## 2018-04-23 NOTE — ED Triage Notes (Signed)
Pt presents with flank pain on left side, back pain and nausea.

## 2018-04-24 DIAGNOSIS — K5720 Diverticulitis of large intestine with perforation and abscess without bleeding: Secondary | ICD-10-CM | POA: Diagnosis not present

## 2018-04-24 LAB — BASIC METABOLIC PANEL
ANION GAP: 12 (ref 5–15)
BUN: 18 mg/dL (ref 8–23)
CO2: 24 mmol/L (ref 22–32)
Calcium: 9.8 mg/dL (ref 8.9–10.3)
Chloride: 97 mmol/L — ABNORMAL LOW (ref 98–111)
Creatinine, Ser: 1.12 mg/dL — ABNORMAL HIGH (ref 0.44–1.00)
GFR calc Af Amer: 51 mL/min — ABNORMAL LOW (ref >60)
GFR, EST NON AFRICAN AMERICAN: 44 mL/min — AB (ref >60)
Glucose, Bld: 104 mg/dL — ABNORMAL HIGH (ref 70–99)
Potassium: 3.3 mmol/L — ABNORMAL LOW (ref 3.5–5.1)
Sodium: 133 mmol/L — ABNORMAL LOW (ref 135–145)

## 2018-04-24 LAB — CBC
HCT: 32.2 % — ABNORMAL LOW (ref 36.0–46.0)
Hemoglobin: 10.7 g/dL — ABNORMAL LOW (ref 12.0–15.0)
MCH: 30.3 pg (ref 26.0–34.0)
MCHC: 33.2 g/dL (ref 30.0–36.0)
MCV: 91.2 fL (ref 80.0–100.0)
NRBC: 0 % (ref 0.0–0.2)
Platelets: 225 10*3/uL (ref 150–400)
RBC: 3.53 MIL/uL — ABNORMAL LOW (ref 3.87–5.11)
RDW: 13.3 % (ref 11.5–15.5)
WBC: 17 10*3/uL — ABNORMAL HIGH (ref 4.0–10.5)

## 2018-04-24 LAB — GLUCOSE, CAPILLARY
Glucose-Capillary: 79 mg/dL (ref 70–99)
Glucose-Capillary: 81 mg/dL (ref 70–99)
Glucose-Capillary: 101 mg/dL — ABNORMAL HIGH (ref 70–99)
Glucose-Capillary: 131 mg/dL — ABNORMAL HIGH (ref 70–99)

## 2018-04-24 NOTE — Progress Notes (Signed)
PROGRESS NOTE    Jade [name]  DJS:[id number] DOB: 1927/12/11 DOA: 04/23/2018 PCP: Deland Pretty, MD   Brief Narrative:  Jade [name] is a 84 y.o. female with medical history significant of diverticulitis, DM2, HTN. Patient presents to the ED with 1 day history of nausea, LLQ abd pain / flank pain.  Similar to prior bouts of diverticulitis.  CT scan showed small intramural abscess.  Patient admitted for IV antibiotics.   Assessment & Plan:   Principal Problem:   Diverticulitis of large intestine with abscess Active Problems:   DM2 (diabetes mellitus, type 2) ([place])   Essential hypertension  1. Diverticulitis with abscess distal sigmoid colon- 1. Patient not feeling well not eating much continue Zosyn 2. Blood cell count remains elevated. 3. Concerning given mural abscess 4. Has had complicated diverticulitis in past requiring OR. 5. Will let patient eat. 6. Zofran PRN nausea 7. Repeat CBC/BMP in AM 2. DM2 - 1. Hold metformin 2. SSI sensitive AC 3. HTN - continue home BP meds 4.   Hypokalemia we will replete orally  DVT prophylaxis: Lovenox Code Status: Full Family Communication: No family in room Disposition Plan: Home after admit Consults called: None Admission status: Place in obs    Subjective: Patient not eating much still with abdominal pain.  Potassium is low at 3.3 white blood cell count is elevated  Objective: Vitals:   04/23/18 1924 04/23/18 1930 04/23/18 2128 04/24/18 0557  BP: 133/85 (!) 153/68 (!) 154/72 123/60  Pulse: 92 85 79 82  Resp: (!) 25 16 18 16   Temp: 99.4 F (37.4 C)  98.9 F (37.2 C) 98.9 F (37.2 C)  TempSrc: Oral  Oral Oral  SpO2: 95% 94% 97% 93%    Intake/Output Summary (Last 24 hours) at 04/24/2018 1435 Last data filed at 04/24/2018 8850 Gross per 24 hour  Intake 74.24 ml  Output -  Net 74.24 ml   There were no vitals filed for this visit.  Examination:  General exam: Appears calm and comfortable  Respiratory  system: Clear to auscultation. Respiratory effort normal. Cardiovascular system: S1 & S2 heard, RRR. No JVD, murmurs, rubs, gallops or clicks. No pedal edema. Gastrointestinal system: Abdomen is nondistended, soft and nontender. No organomegaly or masses felt. Normal bowel sounds heard. Central nervous system: Alert and oriented. No focal neurological deficits. Extremities: Symmetric 5 x 5 power. Skin: No rashes, lesions or ulcers Psychiatry: Judgement and insight appear normal. Mood & affect appropriate.     Data Reviewed: I have personally reviewed following labs and imaging studies  CBC: Recent Labs  Lab 04/23/18 1710 04/24/18 0158  WBC 18.4* 17.0*  NEUTROABS 15.8*  --   HGB 12.0 10.7*  HCT 37.2 32.2*  MCV 93.7 91.2  PLT 242 277   Basic Metabolic Panel: Recent Labs  Lab 04/23/18 1710 04/24/18 0158  NA 134* 133*  K 3.5 3.3*  CL 98 97*  CO2 24 24  GLUCOSE 105* 104*  BUN 22 18  CREATININE 1.09* 1.12*  CALCIUM 10.2 9.8   GFR: CrCl cannot be calculated (Unknown ideal weight.). Liver Function Tests: Recent Labs  Lab 04/23/18 1710  AST 17  ALT 15  ALKPHOS 59  BILITOT 2.0*  PROT 7.9  ALBUMIN 3.7   Recent Labs  Lab 04/23/18 1710  LIPASE 26   No results for input(s): AMMONIA in the last 168 hours. Coagulation Profile: No results for input(s): INR, PROTIME in the last 168 hours. Cardiac Enzymes: No results for input(s): CKTOTAL, CKMB, CKMBINDEX,  TROPONINI in the last 168 hours. BNP (last 3 results) No results for input(s): PROBNP in the last 8760 hours. HbA1C: No results for input(s): HGBA1C in the last 72 hours. CBG: Recent Labs  Lab 04/23/18 2131 04/24/18 0847 04/24/18 1239  GLUCAP 95 79 81   Lipid Profile: No results for input(s): CHOL, HDL, LDLCALC, TRIG, CHOLHDL, LDLDIRECT in the last 72 hours. Thyroid Function Tests: No results for input(s): TSH, T4TOTAL, FREET4, T3FREE, THYROIDAB in the last 72 hours. Anemia Panel: No results for input(s):  VITAMINB12, FOLATE, FERRITIN, TIBC, IRON, RETICCTPCT in the last 72 hours. Sepsis Labs: No results for input(s): PROCALCITON, LATICACIDVEN in the last 168 hours.  No results found for this or any previous visit (from the past 240 hour(s)).       Radiology Studies: Ct Abdomen Pelvis W Contrast  Result Date: 04/23/2018 CLINICAL DATA:  84 year old presenting with acute onset of LEFT flank pain associated with nausea that began last night. Surgical history includes cholecystectomy, appendectomy, TAH-BSO, and unspecified hernia repair. Personal history of tubular adenoma of the rectum. EXAM: CT ABDOMEN AND PELVIS WITH CONTRAST TECHNIQUE: Multidetector CT imaging of the abdomen and pelvis was performed using the standard protocol following bolus administration of intravenous contrast. CONTRAST:  48mL OMNIPAQUE IOHEXOL 300 MG/ML IV. COMPARISON:  03/15/2018, 07/15/2017 and earlier. FINDINGS: Lower chest: Minimal scar and bronchiectasis involving the lower lobes. Visualized lung bases otherwise clear. Heart size upper normal with three-vessel coronary atherosclerosis. Hepatobiliary: Liver normal in size and appearance. Surgically absent gallbladder. Mild intra and extrahepatic biliary ductal dilation without evidence of obstructing stone or mass. Pancreas: Normal in appearance without evidence of mass, ductal dilation, or inflammation. Spleen: Normal in size and appearance. Adrenals/Urinary Tract: Normal appearing adrenal glands. Multiple BILATERAL renal cysts as noted previously. No solid renal masses. No evidence of hydronephrosis involving either kidney. No urinary tract calculi on either side. Normal appearing urinary bladder. Stomach/Bowel: Normal appearing decompressed stomach. Normal-appearing small bowel. Extensive sigmoid colon diverticulosis with evidence of acute diverticulitis involving the distal sigmoid colon, possibly with a small intramural abscess in the distal sigmoid colon. No evidence of  extra colonic abscess. No free intraperitoneal air to suggest microperforation. Appendix surgically absent. Vascular/Lymphatic: Severe aortoiliofemoral atherosclerosis without evidence of aneurysm. Visceral arteries atherosclerotic though patent. Normal-appearing portal venous and systemic venous systems. No pathologic lymphadenopathy. Reproductive: Surgically absent uterus and ovaries. No adnexal masses. Other: None. Musculoskeletal: LOWER thoracic spondylosis and DISH. Multilevel degenerative disc disease, spondylosis and facet degenerative changes throughout the lumbar spine with degenerative grade 1 spondylolisthesis of L4 on L5 measuring approximately 8 mm. No acute findings. IMPRESSION: 1. Acute diverticulitis involving the distal sigmoid colon, possibly with a small intramural abscess involving the distal sigmoid colon. No evidence of extracolonic abscess or microperforation. 2. No acute abnormalities otherwise involving the abdomen or pelvis. 3.  Aortic Atherosclerosis, severe.  (ICD10-170.0) Electronically Signed   By: Evangeline Dakin M.D.   On: 04/23/2018 19:37        Scheduled Meds: . aspirin EC  81 mg Oral Daily  . atorvastatin  5 mg Oral q1800  . cholecalciferol  1,000 Units Oral Daily  . enoxaparin (LOVENOX) injection  40 mg Subcutaneous Q24H  . insulin aspart  0-9 Units Subcutaneous TID WC  . levothyroxine  75 mcg Oral Daily  . losartan  25 mg Oral Daily  . omega-3 acid ethyl esters  1 capsule Oral Daily  . timolol  1 drop Both Eyes BID  . triamterene-hydrochlorothiazide  0.5 tablet Oral Daily  Continuous Infusions: . piperacillin-tazobactam (ZOSYN)  IV 3.375 g (04/24/18 1238)     LOS: 0 days    Time spent: 35 minutes    Lady Deutscher, MD FACP [facility] Pager [phone]  If 7PM-7AM, please contact night-coverage [email or web address] Password [email or web address] 04/24/2018, 2:35 PM

## 2018-04-25 DIAGNOSIS — K5720 Diverticulitis of large intestine with perforation and abscess without bleeding: Secondary | ICD-10-CM | POA: Diagnosis not present

## 2018-04-25 LAB — BASIC METABOLIC PANEL
Anion gap: 11 (ref 5–15)
BUN: 27 mg/dL — ABNORMAL HIGH (ref 8–23)
CO2: 25 mmol/L (ref 22–32)
Calcium: 9.3 mg/dL (ref 8.9–10.3)
Chloride: 100 mmol/L (ref 98–111)
Creatinine, Ser: 1.52 mg/dL — ABNORMAL HIGH (ref 0.44–1.00)
GFR calc Af Amer: 35 mL/min — ABNORMAL LOW (ref >60)
GFR calc non Af Amer: 30 mL/min — ABNORMAL LOW (ref >60)
Glucose, Bld: 98 mg/dL (ref 70–99)
Potassium: 3.2 mmol/L — ABNORMAL LOW (ref 3.5–5.1)
Sodium: 136 mmol/L (ref 135–145)

## 2018-04-25 LAB — CBC
HEMATOCRIT: 29.7 % — AB (ref 36.0–46.0)
Hemoglobin: 9.8 g/dL — ABNORMAL LOW (ref 12.0–15.0)
MCH: 30.2 pg (ref 26.0–34.0)
MCHC: 33 g/dL (ref 30.0–36.0)
MCV: 91.7 fL (ref 80.0–100.0)
Platelets: 215 10*3/uL (ref 150–400)
RBC: 3.24 MIL/uL — ABNORMAL LOW (ref 3.87–5.11)
RDW: 13.1 % (ref 11.5–15.5)
WBC: 10.2 10*3/uL (ref 4.0–10.5)
nRBC: 0 % (ref 0.0–0.2)

## 2018-04-25 LAB — MAGNESIUM: Magnesium: 1.9 mg/dL (ref 1.7–2.4)

## 2018-04-25 LAB — GLUCOSE, CAPILLARY
GLUCOSE-CAPILLARY: 86 mg/dL (ref 70–99)
Glucose-Capillary: 98 mg/dL (ref 70–99)
Glucose-Capillary: 67 mg/dL — ABNORMAL LOW (ref 70–99)
Glucose-Capillary: 152 mg/dL — ABNORMAL HIGH (ref 70–99)

## 2018-04-25 MED ORDER — ENOXAPARIN SODIUM 30 MG/0.3ML ~~LOC~~ SOLN
30.0000 mg | SUBCUTANEOUS | Status: DC
  Administered 2018-04-25 – 2018-04-26 (×2): 30 mg via SUBCUTANEOUS
  Filled 2018-04-25 (×2): qty 0.3

## 2018-04-25 MED ORDER — BISACODYL 10 MG RE SUPP
10.0000 mg | Freq: Once | RECTAL | Status: AC
  Administered 2018-04-25: 10 mg via RECTAL
  Filled 2018-04-25: qty 1

## 2018-04-25 MED ORDER — POTASSIUM CHLORIDE CRYS ER 20 MEQ PO TBCR
40.0000 meq | EXTENDED_RELEASE_TABLET | Freq: Once | ORAL | Status: AC
  Administered 2018-04-25: 40 meq via ORAL
  Filled 2018-04-25: qty 2

## 2018-04-25 MED ORDER — SODIUM CHLORIDE 0.45 % IV SOLN
INTRAVENOUS | Status: DC
  Administered 2018-04-25 – 2018-04-26 (×3): via INTRAVENOUS

## 2018-04-25 NOTE — Plan of Care (Signed)
  Problem: Pain Managment: Goal: General experience of comfort will improve Outcome: Progressing   Problem: Safety: Goal: Ability to remain free from injury will improve Outcome: Progressing   Problem: Skin Integrity: Goal: Risk for impaired skin integrity will decrease Outcome: Progressing   

## 2018-04-25 NOTE — Progress Notes (Signed)
[facility] PROGRESS NOTE  Jade [name] TSV:[id number] DOB: [birth year] DOA: 04/23/2018 PCP: Deland Pretty, MD  Assessment/Plan:  1. Diverticulitis with abscess distal sigmoid colon- 1. Less pain but continues with intermittent nausea. Is eating. Continue Zosyn 2. Afebrile non-toxic appearing 3. Concerning given mural abscess 4. Has had complicated diverticulitis in past requiring OR. 5. Tolerating small amounts food 6. Zofran PRN nausea 7. Repeat CBC/BMP in AM 2. DM2 - low end of normal 1. Hold metformin 2. SSI sensitive AC 3. HTN -  Fair controlcontinue home BP meds 4.   Hypokalemia: remains low.  we will replete orally and recheck  Code Status: full Family Communication: none present Disposition Plan: home hopefully tomorrow   Consultants:  none  Procedures:    Antibiotics:  Zosyn 12/30>>  HPI/Subjective: Sitting on side of bed complaining constipation No acute distress Jade Ress Rogersis a 84 y.o.femalewith medical history significant ofdiverticulitis, DM2, HTN. Patient presents to the ED with 1 day history of nausea, LLQ abd pain / flank pain. Similar to prior bouts of diverticulitis.  CT scan showed small intramural abscess.  Patient admitted for IV antibiotics.   Objective: Vitals:   04/24/18 2115 04/25/18 0509  BP: 120/66 122/64  Pulse: 77 68  Resp: 18 18  Temp: 98.7 F (37.1 C) 98 F (36.7 C)  SpO2: 96% 98%    Intake/Output Summary (Last 24 hours) at 04/25/2018 1023 Last data filed at 04/25/2018 0456 Gross per 24 hour  Intake 475.25 ml  Output -  Net 475.25 ml   There were no vitals filed for this visit.  Exam:   General:  Awake alert in no acute distress  Cardiovascular: rrr no mgr no LE edema  Respiratory: normal effort BS clear bilaterally no wheeze  Abdomen: soft non-distended +BS mild diffuse tenderness no guarding  Musculoskeletal: joints without swelling/erythema   Data Reviewed: Basic Metabolic  Panel: Recent Labs  Lab 04/23/18 1710 04/24/18 0158 04/25/18 0441  NA 134* 133* 136  K 3.5 3.3* 3.2*  CL 98 97* 100  CO2 24 24 25   GLUCOSE 105* 104* 98  BUN 22 18 27*  CREATININE 1.09* 1.12* 1.52*  CALCIUM 10.2 9.8 9.3  MG  --   --  1.9   Liver Function Tests: Recent Labs  Lab 04/23/18 1710  AST 17  ALT 15  ALKPHOS 59  BILITOT 2.0*  PROT 7.9  ALBUMIN 3.7   Recent Labs  Lab 04/23/18 1710  LIPASE 26   No results for input(s): AMMONIA in the last 168 hours. CBC: Recent Labs  Lab 04/23/18 1710 04/24/18 0158 04/25/18 0441  WBC 18.4* 17.0* 10.2  NEUTROABS 15.8*  --   --   HGB 12.0 10.7* 9.8*  HCT 37.2 32.2* 29.7*  MCV 93.7 91.2 91.7  PLT 242 225 215   Cardiac Enzymes: No results for input(s): CKTOTAL, CKMB, CKMBINDEX, TROPONINI in the last 168 hours. BNP (last 3 results) Recent Labs    09/26/17 1800  BNP 109.0*    ProBNP (last 3 results) No results for input(s): PROBNP in the last 8760 hours.  CBG: Recent Labs  Lab 04/24/18 0847 04/24/18 1239 04/24/18 1729 04/24/18 2113 04/25/18 0830  GLUCAP 79 81 101* 131* 86    Recent Results (from the past 240 hour(s))  Blood culture (routine x 2)     Status: None (Preliminary result)   Collection Time: 04/23/18  8:45 PM  Result Value Ref Range Status   Specimen Description BLOOD RIGHT ANTECUBITAL  Final  Special Requests   Final    BOTTLES DRAWN AEROBIC AND ANAEROBIC Blood Culture results may not be optimal due to an excessive volume of blood received in culture bottles   Culture   Final    NO GROWTH 2 DAYS Performed at [facility], [place] [address] [place], [place] [address]    Report Status PENDING  Incomplete  Blood culture (routine x 2)     Status: None (Preliminary result)   Collection Time: 04/23/18  9:00 PM  Result Value Ref Range Status   Specimen Description BLOOD RIGHT HAND  Final   Special Requests   Final    BOTTLES DRAWN AEROBIC AND ANAEROBIC Blood Culture adequate volume    Culture   Final    NO GROWTH 2 DAYS Performed at [facility], [place] [address] [place], [place] [address]    Report Status PENDING  Incomplete     Studies: Ct Abdomen Pelvis W Contrast  Result Date: 04/23/2018 CLINICAL DATA:  84 year old presenting with acute onset of LEFT flank pain associated with nausea that began last night. Surgical history includes cholecystectomy, appendectomy, TAH-BSO, and unspecified hernia repair. Personal history of tubular adenoma of the rectum. EXAM: CT ABDOMEN AND PELVIS WITH CONTRAST TECHNIQUE: Multidetector CT imaging of the abdomen and pelvis was performed using the standard protocol following bolus administration of intravenous contrast. CONTRAST:  84mL OMNIPAQUE IOHEXOL 300 MG/ML IV. COMPARISON:  03/15/2018, 07/15/2017 and earlier. FINDINGS: Lower chest: Minimal scar and bronchiectasis involving the lower lobes. Visualized lung bases otherwise clear. Heart size upper normal with three-vessel coronary atherosclerosis. Hepatobiliary: Liver normal in size and appearance. Surgically absent gallbladder. Mild intra and extrahepatic biliary ductal dilation without evidence of obstructing stone or mass. Pancreas: Normal in appearance without evidence of mass, ductal dilation, or inflammation. Spleen: Normal in size and appearance. Adrenals/Urinary Tract: Normal appearing adrenal glands. Multiple BILATERAL renal cysts as noted previously. No solid renal masses. No evidence of hydronephrosis involving either kidney. No urinary tract calculi on either side. Normal appearing urinary bladder. Stomach/Bowel: Normal appearing decompressed stomach. Normal-appearing small bowel. Extensive sigmoid colon diverticulosis with evidence of acute diverticulitis involving the distal sigmoid colon, possibly with a small intramural abscess in the distal sigmoid colon. No evidence of extra colonic abscess. No free intraperitoneal air to suggest microperforation. Appendix surgically absent.  Vascular/Lymphatic: Severe aortoiliofemoral atherosclerosis without evidence of aneurysm. Visceral arteries atherosclerotic though patent. Normal-appearing portal venous and systemic venous systems. No pathologic lymphadenopathy. Reproductive: Surgically absent uterus and ovaries. No adnexal masses. Other: None. Musculoskeletal: LOWER thoracic spondylosis and DISH. Multilevel degenerative disc disease, spondylosis and facet degenerative changes throughout the lumbar spine with degenerative grade 1 spondylolisthesis of L4 on L5 measuring approximately 8 mm. No acute findings. IMPRESSION: 1. Acute diverticulitis involving the distal sigmoid colon, possibly with a small intramural abscess involving the distal sigmoid colon. No evidence of extracolonic abscess or microperforation. 2. No acute abnormalities otherwise involving the abdomen or pelvis. 3.  Aortic Atherosclerosis, severe.  (ICD10-170.0) Electronically Signed   By: Evangeline Dakin M.D.   On: 04/23/2018 19:37    Scheduled Meds: . aspirin EC  81 mg Oral Daily  . atorvastatin  5 mg Oral q1800  . cholecalciferol  1,000 Units Oral Daily  . enoxaparin (LOVENOX) injection  40 mg Subcutaneous Q24H  . insulin aspart  0-9 Units Subcutaneous TID WC  . levothyroxine  75 mcg Oral Daily  . losartan  25 mg Oral Daily  . omega-3 acid ethyl esters  1  capsule Oral Daily  . timolol  1 drop Both Eyes BID  . triamterene-hydrochlorothiazide  0.5 tablet Oral Daily   Continuous Infusions: . piperacillin-tazobactam (ZOSYN)  IV 3.375 g (04/25/18 0456)    Principal Problem:   Diverticulitis of large intestine with abscess Active Problems:   DM2 (diabetes mellitus, type 2) ([place])   Essential hypertension    Time spent: 34 minutes    [place] NP  [facility]  If 7PM-7AM, please contact night-coverage at [email or web address], password [email or web address] 04/25/2018, 10:23 AM  LOS: 0 days

## 2018-04-25 NOTE — Plan of Care (Signed)
  Problem: Health Behavior/Discharge Planning: Goal: Ability to manage health-related needs will improve Outcome: Progressing   Problem: Nutrition: Goal: Adequate nutrition will be maintained Outcome: Progressing   Problem: Coping: Goal: Level of anxiety will decrease Outcome: Progressing   

## 2018-04-26 ENCOUNTER — Observation Stay (HOSPITAL_COMMUNITY): Payer: Medicare Other

## 2018-04-26 DIAGNOSIS — Z90710 Acquired absence of both cervix and uterus: Secondary | ICD-10-CM | POA: Diagnosis not present

## 2018-04-26 DIAGNOSIS — K5720 Diverticulitis of large intestine with perforation and abscess without bleeding: Secondary | ICD-10-CM | POA: Diagnosis not present

## 2018-04-26 DIAGNOSIS — Z9049 Acquired absence of other specified parts of digestive tract: Secondary | ICD-10-CM | POA: Diagnosis not present

## 2018-04-26 DIAGNOSIS — I129 Hypertensive chronic kidney disease with stage 1 through stage 4 chronic kidney disease, or unspecified chronic kidney disease: Secondary | ICD-10-CM | POA: Diagnosis present

## 2018-04-26 DIAGNOSIS — Z885 Allergy status to narcotic agent status: Secondary | ICD-10-CM | POA: Diagnosis not present

## 2018-04-26 DIAGNOSIS — Z833 Family history of diabetes mellitus: Secondary | ICD-10-CM | POA: Diagnosis not present

## 2018-04-26 DIAGNOSIS — I10 Essential (primary) hypertension: Secondary | ICD-10-CM | POA: Diagnosis not present

## 2018-04-26 DIAGNOSIS — Z7982 Long term (current) use of aspirin: Secondary | ICD-10-CM | POA: Diagnosis not present

## 2018-04-26 DIAGNOSIS — R1032 Left lower quadrant pain: Secondary | ICD-10-CM | POA: Diagnosis present

## 2018-04-26 DIAGNOSIS — E119 Type 2 diabetes mellitus without complications: Secondary | ICD-10-CM | POA: Diagnosis not present

## 2018-04-26 DIAGNOSIS — Z801 Family history of malignant neoplasm of trachea, bronchus and lung: Secondary | ICD-10-CM | POA: Diagnosis not present

## 2018-04-26 DIAGNOSIS — D72829 Elevated white blood cell count, unspecified: Secondary | ICD-10-CM

## 2018-04-26 DIAGNOSIS — N179 Acute kidney failure, unspecified: Secondary | ICD-10-CM | POA: Diagnosis present

## 2018-04-26 DIAGNOSIS — Z8249 Family history of ischemic heart disease and other diseases of the circulatory system: Secondary | ICD-10-CM | POA: Diagnosis not present

## 2018-04-26 DIAGNOSIS — N183 Chronic kidney disease, stage 3 (moderate): Secondary | ICD-10-CM | POA: Diagnosis present

## 2018-04-26 DIAGNOSIS — K219 Gastro-esophageal reflux disease without esophagitis: Secondary | ICD-10-CM | POA: Diagnosis present

## 2018-04-26 DIAGNOSIS — K5792 Diverticulitis of intestine, part unspecified, without perforation or abscess without bleeding: Secondary | ICD-10-CM | POA: Diagnosis present

## 2018-04-26 DIAGNOSIS — Z888 Allergy status to other drugs, medicaments and biological substances status: Secondary | ICD-10-CM | POA: Diagnosis not present

## 2018-04-26 DIAGNOSIS — E039 Hypothyroidism, unspecified: Secondary | ICD-10-CM | POA: Diagnosis present

## 2018-04-26 DIAGNOSIS — H409 Unspecified glaucoma: Secondary | ICD-10-CM | POA: Diagnosis present

## 2018-04-26 DIAGNOSIS — E876 Hypokalemia: Secondary | ICD-10-CM | POA: Diagnosis present

## 2018-04-26 DIAGNOSIS — Z79890 Hormone replacement therapy (postmenopausal): Secondary | ICD-10-CM | POA: Diagnosis not present

## 2018-04-26 DIAGNOSIS — E1122 Type 2 diabetes mellitus with diabetic chronic kidney disease: Secondary | ICD-10-CM | POA: Diagnosis present

## 2018-04-26 DIAGNOSIS — K5900 Constipation, unspecified: Secondary | ICD-10-CM | POA: Diagnosis not present

## 2018-04-26 DIAGNOSIS — E785 Hyperlipidemia, unspecified: Secondary | ICD-10-CM | POA: Diagnosis present

## 2018-04-26 LAB — GLUCOSE, CAPILLARY
Glucose-Capillary: 76 mg/dL (ref 70–99)
Glucose-Capillary: 94 mg/dL (ref 70–99)
Glucose-Capillary: 83 mg/dL (ref 70–99)
Glucose-Capillary: 102 mg/dL — ABNORMAL HIGH (ref 70–99)

## 2018-04-26 LAB — BASIC METABOLIC PANEL
Anion gap: 10 (ref 5–15)
BUN: 29 mg/dL — ABNORMAL HIGH (ref 8–23)
CO2: 23 mmol/L (ref 22–32)
Calcium: 9.3 mg/dL (ref 8.9–10.3)
Chloride: 100 mmol/L (ref 98–111)
Creatinine, Ser: 1.26 mg/dL — ABNORMAL HIGH (ref 0.44–1.00)
GFR calc Af Amer: 44 mL/min — ABNORMAL LOW (ref >60)
GFR calc non Af Amer: 38 mL/min — ABNORMAL LOW (ref >60)
Glucose, Bld: 88 mg/dL (ref 70–99)
Potassium: 3.8 mmol/L (ref 3.5–5.1)
Sodium: 133 mmol/L — ABNORMAL LOW (ref 135–145)

## 2018-04-26 MED ORDER — AMOXICILLIN-POT CLAVULANATE 875-125 MG PO TABS
1.0000 | ORAL_TABLET | Freq: Two times a day (BID) | ORAL | Status: DC
  Administered 2018-04-26 – 2018-04-27 (×3): 1 via ORAL
  Filled 2018-04-26 (×3): qty 1

## 2018-04-26 MED ORDER — IOHEXOL 300 MG/ML  SOLN
75.0000 mL | Freq: Once | INTRAMUSCULAR | Status: AC | PRN
  Administered 2018-04-26: 75 mL via INTRAVENOUS

## 2018-04-26 NOTE — Progress Notes (Addendum)
Progress Note    Jade [name]  ACZ:[id number] DOB: [birth year]  DOA: 04/23/2018 PCP: Deland Pretty, MD    Brief Narrative:    Medical records reviewed and are as summarized below:  Jade [name] is an 85 y.o. female with medical history significant ofdiverticulitis, DM2, HTN. Patient presents to the ED with 1 day history of nausea, LLQ abd pain / flank pain. Similar to prior bouts of diverticulitis-- last was in late November.CT scan showed small intramural abscess. Slow to improve   Assessment/Plan:   Principal Problem:   Diverticulitis of large intestine with abscess Active Problems:   DM2 (diabetes mellitus, type 2) ([place])   Essential hypertension  Diverticulitiswith possible abscess distal sigmoid colon- -is eating well but continues to have pain-- with movement and palpation -WBC count is improved with IV abx -continue IV Abx -with continued pain, discussed with GS-- plan to repeat CT abd/pelvis to ensure that area has not worsened -20 years ago needed surgical intervention for diverticulitis -follows with [facility]  DM2  -Hold metformin -SSI sensitive AC  HTN -hold ARB/HCTZ  Hypokalemia -repleted  AKI-- improved with IVF -IVF -monitor closely   Late entry: Will change to PO abx today as CT Scan shows improvement.  Will do trial of Augmentin to ensure patient will tolerate  Family Communication/Anticipated D/C date and plan/Code Status   DVT prophylaxis: Lovenox ordered. Code Status: Full Code.  Family Communication: at bedside Disposition Plan: home in AM if tolerating PO abx   Medical Consultants:     Subjective:   +BM but still with shooting pains with movement-- in abdomen  Objective:    Vitals:   04/25/18 0509 04/25/18 1431 04/25/18 2150 04/26/18 0506  BP: 122/64 140/75 131/71 139/74  Pulse: 68 67 65 (!) 59  Resp: 18 18 16 17   Temp: 98 F (36.7 C) 97.7 F (36.5 C) 97.6 F (36.4 C) (!) 97.4 F (36.3 C)  TempSrc: Oral  Oral Oral Oral  SpO2: 98% 100% 99% 100%    Intake/Output Summary (Last 24 hours) at 04/26/2018 1224 Last data filed at 04/26/2018 0524 Gross per 24 hour  Intake 1444.5 ml  Output -  Net 1444.5 ml   There were no vitals filed for this visit.  Exam: In bed, pleasant and cooperative rrr +BS, very tender to palpation in abdomen--more so in LLQ  Data Reviewed:   I have personally reviewed following labs and imaging studies:  Labs: Labs show the following:   Basic Metabolic Panel: Recent Labs  Lab 04/23/18 1710 04/24/18 0158 04/25/18 0441 04/26/18 0216  NA 134* 133* 136 133*  K 3.5 3.3* 3.2* 3.8  CL 98 97* 100 100  CO2 24 24 25 23   GLUCOSE 105* 104* 98 88  BUN 22 18 27* 29*  CREATININE 1.09* 1.12* 1.52* 1.26*  CALCIUM 10.2 9.8 9.3 9.3  MG  --   --  1.9  --    GFR CrCl cannot be calculated (Unknown ideal weight.). Liver Function Tests: Recent Labs  Lab 04/23/18 1710  AST 17  ALT 15  ALKPHOS 59  BILITOT 2.0*  PROT 7.9  ALBUMIN 3.7   Recent Labs  Lab 04/23/18 1710  LIPASE 26   No results for input(s): AMMONIA in the last 168 hours. Coagulation profile No results for input(s): INR, PROTIME in the last 168 hours.  CBC: Recent Labs  Lab 04/23/18 1710 04/24/18 0158 04/25/18 0441  WBC 18.4* 17.0* 10.2  NEUTROABS 15.8*  --   --  HGB 12.0 10.7* 9.8*  HCT 37.2 32.2* 29.7*  MCV 93.7 91.2 91.7  PLT 242 225 215   Cardiac Enzymes: No results for input(s): CKTOTAL, CKMB, CKMBINDEX, TROPONINI in the last 168 hours. BNP (last 3 results) No results for input(s): PROBNP in the last 8760 hours. CBG: Recent Labs  Lab 04/25/18 0830 04/25/18 1232 04/25/18 1730 04/25/18 1951 04/26/18 0824  GLUCAP 86 98 67* 152* 76   D-Dimer: No results for input(s): DDIMER in the last 72 hours. Hgb A1c: No results for input(s): HGBA1C in the last 72 hours. Lipid Profile: No results for input(s): CHOL, HDL, LDLCALC, TRIG, CHOLHDL, LDLDIRECT in the last 72 hours. Thyroid  function studies: No results for input(s): TSH, T4TOTAL, T3FREE, THYROIDAB in the last 72 hours.  Invalid input(s): FREET3 Anemia work up: No results for input(s): VITAMINB12, FOLATE, FERRITIN, TIBC, IRON, RETICCTPCT in the last 72 hours. Sepsis Labs: Recent Labs  Lab 04/23/18 1710 04/24/18 0158 04/25/18 0441  WBC 18.4* 17.0* 10.2    Microbiology Recent Results (from the past 240 hour(s))  Blood culture (routine x 2)     Status: None (Preliminary result)   Collection Time: 04/23/18  8:45 PM  Result Value Ref Range Status   Specimen Description BLOOD RIGHT ANTECUBITAL  Final   Special Requests   Final    BOTTLES DRAWN AEROBIC AND ANAEROBIC Blood Culture results may not be optimal due to an excessive volume of blood received in culture bottles   Culture   Final    NO GROWTH 2 DAYS Performed at [facility] [address] [place], [place] [address]    Report Status PENDING  Incomplete  Blood culture (routine x 2)     Status: None (Preliminary result)   Collection Time: 04/23/18  9:00 PM  Result Value Ref Range Status   Specimen Description BLOOD RIGHT HAND  Final   Special Requests   Final    BOTTLES DRAWN AEROBIC AND ANAEROBIC Blood Culture adequate volume   Culture   Final    NO GROWTH 2 DAYS Performed at [facility], [place] [address] [place], [place] [address]    Report Status PENDING  Incomplete    Procedures and diagnostic studies:  No results found.  Medications:   . aspirin EC  81 mg Oral Daily  . atorvastatin  5 mg Oral q1800  . cholecalciferol  1,000 Units Oral Daily  . enoxaparin (LOVENOX) injection  30 mg Subcutaneous Q24H  . insulin aspart  0-9 Units Subcutaneous TID WC  . levothyroxine  75 mcg Oral Daily  . omega-3 acid ethyl esters  1 capsule Oral Daily  . timolol  1 drop Both Eyes BID   Continuous Infusions: . sodium chloride 75 mL/hr at 04/26/18 0225  . piperacillin-tazobactam (ZOSYN)  IV 3.375 g (04/26/18 0504)     LOS: 0 days     Geradine Girt  [facility]   *Please refer to [email or web address], password [email or web address] to get updated schedule on who will round on this patient, as hospitalists switch teams weekly. If 7PM-7AM, please contact night-coverage at [email or web address], password [email or web address] for any overnight needs.  04/26/2018, 12:24 PM

## 2018-04-27 ENCOUNTER — Other Ambulatory Visit: Payer: Self-pay

## 2018-04-27 DIAGNOSIS — I10 Essential (primary) hypertension: Secondary | ICD-10-CM

## 2018-04-27 DIAGNOSIS — E119 Type 2 diabetes mellitus without complications: Secondary | ICD-10-CM

## 2018-04-27 DIAGNOSIS — K5720 Diverticulitis of large intestine with perforation and abscess without bleeding: Principal | ICD-10-CM

## 2018-04-27 LAB — BASIC METABOLIC PANEL
Anion gap: 8 (ref 5–15)
BUN: 22 mg/dL (ref 8–23)
CHLORIDE: 102 mmol/L (ref 98–111)
CO2: 25 mmol/L (ref 22–32)
Calcium: 9.2 mg/dL (ref 8.9–10.3)
Creatinine, Ser: 1.17 mg/dL — ABNORMAL HIGH (ref 0.44–1.00)
GFR calc Af Amer: 48 mL/min — ABNORMAL LOW (ref >60)
GFR calc non Af Amer: 42 mL/min — ABNORMAL LOW (ref >60)
Glucose, Bld: 99 mg/dL (ref 70–99)
Potassium: 4 mmol/L (ref 3.5–5.1)
Sodium: 135 mmol/L (ref 135–145)

## 2018-04-27 LAB — CBC
HCT: 28.8 % — ABNORMAL LOW (ref 36.0–46.0)
Hemoglobin: 9.9 g/dL — ABNORMAL LOW (ref 12.0–15.0)
MCH: 31.6 pg (ref 26.0–34.0)
MCHC: 34.4 g/dL (ref 30.0–36.0)
MCV: 92 fL (ref 80.0–100.0)
Platelets: 275 10*3/uL (ref 150–400)
RBC: 3.13 MIL/uL — ABNORMAL LOW (ref 3.87–5.11)
RDW: 12.9 % (ref 11.5–15.5)
WBC: 6.2 10*3/uL (ref 4.0–10.5)
nRBC: 0 % (ref 0.0–0.2)

## 2018-04-27 LAB — GLUCOSE, CAPILLARY: Glucose-Capillary: 98 mg/dL (ref 70–99)

## 2018-04-27 MED ORDER — AMOXICILLIN-POT CLAVULANATE 875-125 MG PO TABS: 1.0000 | ORAL_TABLET | Freq: Two times a day (BID) | ORAL | 0 refills | Status: DC

## 2018-04-27 MED ORDER — METFORMIN HCL 500 MG PO TABS: 500.0000 mg | ORAL_TABLET | Freq: Every day | ORAL | Status: AC

## 2018-04-27 NOTE — Discharge Summary (Signed)
PATIENT DETAILS Name: Jade [name] Age: 85 y.o. Sex: female Date of Birth: Dec 23, 1927 MRN: [id number]. Admitting Physician: Etta Quill, DO IRW:ERXVQ, [place] Jew, MD  Admit Date: 04/23/2018 Discharge date: 04/27/2018  Recommendations for Outpatient Follow-up:  1. Follow up with PCP in 1-2 weeks 2. Please obtain BMP/CBC in one week  Admitted From:  Home  Disposition: Pax: No  Equipment/Devices: None  Discharge Condition: Stable  CODE STATUS: FULL CODE  Diet recommendation:  Heart Healthy / Carb Modified   Brief Summary: See H&P, Labs, Consult and Test reports for all details in brief, Jade [name] is an 85 y.o. female with medical history significant ofdiverticulitis, DM2, HTN presented with LLQ-found to have diverticulitis and subsequently admitted to the hospitalist service. See below for details.   Brief Hospital Course: Diverticulitiswith possible abscess distal sigmoid colon:managed conservatively with IV Abx and other supportive care. Diet slowly advanced, by day of discharge tolerating regular diet. Mild LLQ tenderness persists-but overall much better. No fever or leukocytosis as well. Initial CT Abd/pelvs scan on admission showed diverticulitis of sigmoid colon with possible small intramural abscess, repeat CT scan on 1/1 showed no abscess-and improvement in the diverticulitis in the distal sigmoid. Since tolerating diet, clinically and radiographically improved, stable for d/c today, continue Augmentin on discharge. Patient asked to stay on a soft diet, educated about importance of fibre in diet.   DM2: CBG/s stable-resume Metformin on discharge   HTN: BP stable, resume all antihypertensives on discharge.  CKD stage 3: creatinine close to usual baseline.   Procedures/Studies: None  Discharge Diagnoses:  Principal Problem:   Diverticulitis of large intestine with abscess Active Problems:   DM2 (diabetes mellitus, type 2)  ([place])   Essential hypertension   Acute diverticulitis   Discharge Instructions:  Activity:  As tolerated  Discharge Instructions    Call MD for:  persistant nausea and vomiting   Complete by:  As directed    Call MD for:  severe uncontrolled pain   Complete by:  As directed    Diet - low sodium heart healthy   Complete by:  As directed    Diet Carb Modified   Complete by:  As directed    Discharge instructions   Complete by:  As directed    Follow with Primary MD  Deland Pretty, MD in 1 week  Stay on a soft diet for 1 week  Please get a complete blood count and chemistry panel checked by your Primary MD at your next visit, and again as instructed by your Primary MD.  Get Medicines reviewed and adjusted: Please take all your medications with you for your next visit with your Primary MD  Laboratory/radiological data: Please request your Primary MD to go over all hospital tests and procedure/radiological results at the follow up, please ask your Primary MD to get all Hospital records sent to his/her office.  In some cases, they will be blood work, cultures and biopsy results pending at the time of your discharge. Please request that your primary care M.D. follows up on these results.  Also Note the following: If you experience worsening of your admission symptoms, develop shortness of breath, life threatening emergency, suicidal or homicidal thoughts you must seek medical attention immediately by calling 911 or calling your MD immediately  if symptoms less severe.  You must read complete instructions/literature along with all the possible adverse reactions/side effects for all the Medicines you take and that have been prescribed to  you. Take any new Medicines after you have completely understood and accpet all the possible adverse reactions/side effects.   Do not drive when taking Pain medications or sleeping medications (Benzodaizepines)  Do not take more than prescribed Pain,  Sleep and Anxiety Medications. It is not advisable to combine anxiety,sleep and pain medications without talking with your primary care practitioner  Special Instructions: If you have smoked or chewed Tobacco  in the last 2 yrs please stop smoking, stop any regular Alcohol  and or any Recreational drug use.  Wear Seat belts while driving.  Please note: You were cared for by a hospitalist during your hospital stay. Once you are discharged, your primary care physician will handle any further medical issues. Please note that NO REFILLS for any discharge medications will be authorized once you are discharged, as it is imperative that you return to your primary care physician (or establish a relationship with a primary care physician if you do not have one) for your post hospital discharge needs so that they can reassess your need for medications and monitor your lab values.   Increase activity slowly   Complete by:  As directed      Allergies as of 04/27/2018      Reactions   Codeine    Miralax [polyethylene Glycol] Swelling   Feet swelling   Pentazocine Lactate    Tramadol Hcl       Medication List    TAKE these medications   amoxicillin-clavulanate 875-125 MG tablet Commonly known as:  AUGMENTIN Take 1 tablet by mouth every 12 (twelve) hours.   aspirin 81 MG tablet Take 81 mg by mouth daily.   atorvastatin 10 MG tablet Commonly known as:  LIPITOR Take 5 mg by mouth daily at 6 PM.   CoQ10 50 MG Caps Take 1 capsule by mouth daily.   fish oil-omega-3 fatty acids 1000 MG capsule Take 1 capsule by mouth daily.   FREESTYLE LITE test strip Generic drug:  glucose blood TEST QAM   levothyroxine 75 MCG tablet Commonly known as:  SYNTHROID, LEVOTHROID Take 75 mcg by mouth daily.   losartan 50 MG tablet Commonly known as:  COZAAR Take 25 mg by mouth daily.   metFORMIN 500 MG tablet Commonly known as:  GLUCOPHAGE Take 1 tablet (500 mg total) by mouth daily with breakfast. Start  taking on:  April 28, 2018   timolol 0.5 % ophthalmic solution Commonly known as:  TIMOPTIC Place 1 drop into both eyes 2 (two) times daily.   triamterene-hydrochlorothiazide 75-50 MG tablet Commonly known as:  MAXZIDE Take 0.5 tablets by mouth daily.   Vitamin D 1000 units capsule Take 1,000 Units by mouth daily.      Follow-up Information    Deland Pretty, MD. Schedule an appointment as soon as possible for a visit in 1 week(s).   Specialty:  Internal Medicine Contact information: [place] [address] [phone]          Allergies  Allergen Reactions  . Codeine   . Miralax [Polyethylene Glycol] Swelling    Feet swelling  . Pentazocine Lactate   . Tramadol Hcl       Consultations:  None  Other Procedures/Studies: Ct Abdomen Pelvis W Contrast  Result Date: 04/26/2018 CLINICAL DATA:  85 year old female with history of diverticulitis. Evaluate for abscess in the region of the sigmoid colon. EXAM: CT ABDOMEN AND PELVIS WITH CONTRAST TECHNIQUE: Multidetector CT imaging of the abdomen and pelvis was performed using  the standard protocol following bolus administration of intravenous contrast. CONTRAST:  47mL OMNIPAQUE IOHEXOL 300 MG/ML  SOLN COMPARISON:  CT the abdomen and pelvis 04/23/2018. FINDINGS: Lower chest: Mild scarring in the lung bases bilaterally. Atherosclerotic calcifications in the left anterior descending, left circumflex and right coronary arteries. Hepatobiliary: No suspicious cystic or solid hepatic lesions. No intra or extrahepatic biliary ductal dilatation. Status post cholecystectomy. Pancreas: No pancreatic mass. No pancreatic ductal dilatation. No pancreatic or peripancreatic fluid or inflammatory changes. Spleen: Unremarkable. Adrenals/Urinary Tract: Low-attenuation lesions in the kidneys bilaterally, compatible with simple cysts, largest of which measures 3.5 cm in the upper pole (axial image 15 of series 3). No  hydroureteronephrosis. Urinary bladder is normal in appearance. Bilateral adrenal glands are normal in appearance. Stomach/Bowel: Normal appearance of the stomach. No pathologic dilatation of small bowel or colon. Numerous colonic diverticulae are again noted, particularly in the sigmoid colon and proximal rectum where there is also extensive mural thickening. When compared to the prior examination, the extent of inflammatory changes adjacent to the proximal rectum and distal sigmoid colon has significantly decreased. No discrete diverticular abscess confidently identified on today's examination. The appendix is not confidently identified and may be surgically absent. Regardless, there are no inflammatory changes noted adjacent to the cecum to suggest the presence of an acute appendicitis at this time. Vascular/Lymphatic: Aortic atherosclerosis, without evidence of aneurysm or dissection in the abdominal or pelvic vasculature. No lymphadenopathy noted in the abdomen or pelvis. Reproductive: Status post hysterectomy. Ovaries are not confidently identified may be surgically absent or atrophic. Other: No significant volume of ascites.  No pneumoperitoneum. Musculoskeletal: There are no aggressive appearing lytic or blastic lesions noted in the visualized portions of the skeleton. IMPRESSION: 1. Changes of acute diverticulitis in the distal sigmoid colon and proximal rectum appears slightly improved compared to the prior study, indicative of positive response to therapy. No discrete diverticular abscess noted at this time. 2. No new acute findings are noted elsewhere in the abdomen or pelvis. 3. Aortic atherosclerosis, in addition to three-vessel coronary artery disease. 4. Additional incidental findings, as above. Electronically Signed   By: Vinnie Langton M.D.   On: 04/26/2018 14:31   Ct Abdomen Pelvis W Contrast  Result Date: 04/23/2018 CLINICAL DATA:  85 year old presenting with acute onset of LEFT flank pain  associated with nausea that began last night. Surgical history includes cholecystectomy, appendectomy, TAH-BSO, and unspecified hernia repair. Personal history of tubular adenoma of the rectum. EXAM: CT ABDOMEN AND PELVIS WITH CONTRAST TECHNIQUE: Multidetector CT imaging of the abdomen and pelvis was performed using the standard protocol following bolus administration of intravenous contrast. CONTRAST:  67mL OMNIPAQUE IOHEXOL 300 MG/ML IV. COMPARISON:  03/15/2018, 07/15/2017 and earlier. FINDINGS: Lower chest: Minimal scar and bronchiectasis involving the lower lobes. Visualized lung bases otherwise clear. Heart size upper normal with three-vessel coronary atherosclerosis. Hepatobiliary: Liver normal in size and appearance. Surgically absent gallbladder. Mild intra and extrahepatic biliary ductal dilation without evidence of obstructing stone or mass. Pancreas: Normal in appearance without evidence of mass, ductal dilation, or inflammation. Spleen: Normal in size and appearance. Adrenals/Urinary Tract: Normal appearing adrenal glands. Multiple BILATERAL renal cysts as noted previously. No solid renal masses. No evidence of hydronephrosis involving either kidney. No urinary tract calculi on either side. Normal appearing urinary bladder. Stomach/Bowel: Normal appearing decompressed stomach. Normal-appearing small bowel. Extensive sigmoid colon diverticulosis with evidence of acute diverticulitis involving the distal sigmoid colon, possibly with a small intramural abscess in the distal sigmoid colon. No evidence  of extra colonic abscess. No free intraperitoneal air to suggest microperforation. Appendix surgically absent. Vascular/Lymphatic: Severe aortoiliofemoral atherosclerosis without evidence of aneurysm. Visceral arteries atherosclerotic though patent. Normal-appearing portal venous and systemic venous systems. No pathologic lymphadenopathy. Reproductive: Surgically absent uterus and ovaries. No adnexal masses.  Other: None. Musculoskeletal: LOWER thoracic spondylosis and DISH. Multilevel degenerative disc disease, spondylosis and facet degenerative changes throughout the lumbar spine with degenerative grade 1 spondylolisthesis of L4 on L5 measuring approximately 8 mm. No acute findings. IMPRESSION: 1. Acute diverticulitis involving the distal sigmoid colon, possibly with a small intramural abscess involving the distal sigmoid colon. No evidence of extracolonic abscess or microperforation. 2. No acute abnormalities otherwise involving the abdomen or pelvis. 3.  Aortic Atherosclerosis, severe.  (ICD10-170.0) Electronically Signed   By: Evangeline Dakin M.D.   On: 04/23/2018 19:37     TODAY-DAY OF DISCHARGE:  Subjective:   Jade [name] today has no headache,no chest abdominal pain,no new weakness tingling or numbness, feels much better wants to go home today.   Objective:   Blood pressure 124/67, pulse (!) 55, temperature (!) 97.5 F (36.4 C), temperature source Oral, resp. rate 14, SpO2 99 %.  Intake/Output Summary (Last 24 hours) at 04/27/2018 8366 Last data filed at 04/27/2018 0900 Gross per 24 hour  Intake 2437.87 ml  Output -  Net 2437.87 ml   There were no vitals filed for this visit.  Exam: Awake Alert, Oriented *3, No new F.N deficits, Normal affect [place].AT,PERRAL Supple Neck,No JVD, No cervical lymphadenopathy appriciated.  Symmetrical Chest wall movement, Good air movement bilaterally, CTAB RRR,No Gallops,Rubs or new Murmurs, No Parasternal Heave +ve B.Sounds, Abd Soft, very mildly tender in the LLQ, No organomegaly appriciated, No rebound -guarding or rigidity. No Cyanosis, Clubbing or edema, No new Rash or bruise   PERTINENT RADIOLOGIC STUDIES: Ct Abdomen Pelvis W Contrast  Result Date: 04/26/2018 CLINICAL DATA:  85 year old female with history of diverticulitis. Evaluate for abscess in the region of the sigmoid colon. EXAM: CT ABDOMEN AND PELVIS WITH CONTRAST TECHNIQUE:  Multidetector CT imaging of the abdomen and pelvis was performed using the standard protocol following bolus administration of intravenous contrast. CONTRAST:  28mL OMNIPAQUE IOHEXOL 300 MG/ML  SOLN COMPARISON:  CT the abdomen and pelvis 04/23/2018. FINDINGS: Lower chest: Mild scarring in the lung bases bilaterally. Atherosclerotic calcifications in the left anterior descending, left circumflex and right coronary arteries. Hepatobiliary: No suspicious cystic or solid hepatic lesions. No intra or extrahepatic biliary ductal dilatation. Status post cholecystectomy. Pancreas: No pancreatic mass. No pancreatic ductal dilatation. No pancreatic or peripancreatic fluid or inflammatory changes. Spleen: Unremarkable. Adrenals/Urinary Tract: Low-attenuation lesions in the kidneys bilaterally, compatible with simple cysts, largest of which measures 3.5 cm in the upper pole (axial image 15 of series 3). No hydroureteronephrosis. Urinary bladder is normal in appearance. Bilateral adrenal glands are normal in appearance. Stomach/Bowel: Normal appearance of the stomach. No pathologic dilatation of small bowel or colon. Numerous colonic diverticulae are again noted, particularly in the sigmoid colon and proximal rectum where there is also extensive mural thickening. When compared to the prior examination, the extent of inflammatory changes adjacent to the proximal rectum and distal sigmoid colon has significantly decreased. No discrete diverticular abscess confidently identified on today's examination. The appendix is not confidently identified and may be surgically absent. Regardless, there are no inflammatory changes noted adjacent to the cecum to suggest the presence of an acute appendicitis at this time. Vascular/Lymphatic: Aortic atherosclerosis, without evidence of aneurysm or dissection in the abdominal  or pelvic vasculature. No lymphadenopathy noted in the abdomen or pelvis. Reproductive: Status post hysterectomy. Ovaries  are not confidently identified may be surgically absent or atrophic. Other: No significant volume of ascites.  No pneumoperitoneum. Musculoskeletal: There are no aggressive appearing lytic or blastic lesions noted in the visualized portions of the skeleton. IMPRESSION: 1. Changes of acute diverticulitis in the distal sigmoid colon and proximal rectum appears slightly improved compared to the prior study, indicative of positive response to therapy. No discrete diverticular abscess noted at this time. 2. No new acute findings are noted elsewhere in the abdomen or pelvis. 3. Aortic atherosclerosis, in addition to three-vessel coronary artery disease. 4. Additional incidental findings, as above. Electronically Signed   By: Vinnie Langton M.D.   On: 04/26/2018 14:31   Ct Abdomen Pelvis W Contrast  Result Date: 04/23/2018 CLINICAL DATA:  85 year old presenting with acute onset of LEFT flank pain associated with nausea that began last night. Surgical history includes cholecystectomy, appendectomy, TAH-BSO, and unspecified hernia repair. Personal history of tubular adenoma of the rectum. EXAM: CT ABDOMEN AND PELVIS WITH CONTRAST TECHNIQUE: Multidetector CT imaging of the abdomen and pelvis was performed using the standard protocol following bolus administration of intravenous contrast. CONTRAST:  66mL OMNIPAQUE IOHEXOL 300 MG/ML IV. COMPARISON:  03/15/2018, 07/15/2017 and earlier. FINDINGS: Lower chest: Minimal scar and bronchiectasis involving the lower lobes. Visualized lung bases otherwise clear. Heart size upper normal with three-vessel coronary atherosclerosis. Hepatobiliary: Liver normal in size and appearance. Surgically absent gallbladder. Mild intra and extrahepatic biliary ductal dilation without evidence of obstructing stone or mass. Pancreas: Normal in appearance without evidence of mass, ductal dilation, or inflammation. Spleen: Normal in size and appearance. Adrenals/Urinary Tract: Normal appearing  adrenal glands. Multiple BILATERAL renal cysts as noted previously. No solid renal masses. No evidence of hydronephrosis involving either kidney. No urinary tract calculi on either side. Normal appearing urinary bladder. Stomach/Bowel: Normal appearing decompressed stomach. Normal-appearing small bowel. Extensive sigmoid colon diverticulosis with evidence of acute diverticulitis involving the distal sigmoid colon, possibly with a small intramural abscess in the distal sigmoid colon. No evidence of extra colonic abscess. No free intraperitoneal air to suggest microperforation. Appendix surgically absent. Vascular/Lymphatic: Severe aortoiliofemoral atherosclerosis without evidence of aneurysm. Visceral arteries atherosclerotic though patent. Normal-appearing portal venous and systemic venous systems. No pathologic lymphadenopathy. Reproductive: Surgically absent uterus and ovaries. No adnexal masses. Other: None. Musculoskeletal: LOWER thoracic spondylosis and DISH. Multilevel degenerative disc disease, spondylosis and facet degenerative changes throughout the lumbar spine with degenerative grade 1 spondylolisthesis of L4 on L5 measuring approximately 8 mm. No acute findings. IMPRESSION: 1. Acute diverticulitis involving the distal sigmoid colon, possibly with a small intramural abscess involving the distal sigmoid colon. No evidence of extracolonic abscess or microperforation. 2. No acute abnormalities otherwise involving the abdomen or pelvis. 3.  Aortic Atherosclerosis, severe.  (ICD10-170.0) Electronically Signed   By: Evangeline Dakin M.D.   On: 04/23/2018 19:37     PERTINENT LAB RESULTS: CBC: Recent Labs    04/25/18 0441 04/27/18 0135  WBC 10.2 6.2  HGB 9.8* 9.9*  HCT 29.7* 28.8*  PLT 215 275   CMET CMP     Component Value Date/Time   NA 135 04/27/2018 0135   NA 137 09/26/2017 1800   K 4.0 04/27/2018 0135   CL 102 04/27/2018 0135   CO2 25 04/27/2018 0135   GLUCOSE 99 04/27/2018 0135    BUN 22 04/27/2018 0135   BUN 28 (H) 09/26/2017 1800   CREATININE 1.17 (H)  04/27/2018 0135   CALCIUM 9.2 04/27/2018 0135   PROT 7.9 04/23/2018 1710   PROT 7.8 09/26/2017 1800   ALBUMIN 3.7 04/23/2018 1710   ALBUMIN 4.3 09/26/2017 1800   AST 17 04/23/2018 1710   ALT 15 04/23/2018 1710   ALKPHOS 59 04/23/2018 1710   BILITOT 2.0 (H) 04/23/2018 1710   BILITOT 0.5 09/26/2017 1800   GFRNONAA 42 (L) 04/27/2018 0135   GFRAA 48 (L) 04/27/2018 0135    GFR CrCl cannot be calculated (Unknown ideal weight.). No results for input(s): LIPASE, AMYLASE in the last 72 hours. No results for input(s): CKTOTAL, CKMB, CKMBINDEX, TROPONINI in the last 72 hours. Invalid input(s): POCBNP No results for input(s): DDIMER in the last 72 hours. No results for input(s): HGBA1C in the last 72 hours. No results for input(s): CHOL, HDL, LDLCALC, TRIG, CHOLHDL, LDLDIRECT in the last 72 hours. No results for input(s): TSH, T4TOTAL, T3FREE, THYROIDAB in the last 72 hours.  Invalid input(s): FREET3 No results for input(s): VITAMINB12, FOLATE, FERRITIN, TIBC, IRON, RETICCTPCT in the last 72 hours. Coags: No results for input(s): INR in the last 72 hours.  Invalid input(s): PT Microbiology: Recent Results (from the past 240 hour(s))  Blood culture (routine x 2)     Status: None (Preliminary result)   Collection Time: 04/23/18  8:45 PM  Result Value Ref Range Status   Specimen Description BLOOD RIGHT ANTECUBITAL  Final   Special Requests   Final    BOTTLES DRAWN AEROBIC AND ANAEROBIC Blood Culture results may not be optimal due to an excessive volume of blood received in culture bottles   Culture   Final    NO GROWTH 3 DAYS Performed at [facility], [place] [address] [place], [place] [address]    Report Status PENDING  Incomplete  Blood culture (routine x 2)     Status: None (Preliminary result)   Collection Time: 04/23/18  9:00 PM  Result Value Ref Range Status   Specimen Description BLOOD RIGHT HAND   Final   Special Requests   Final    BOTTLES DRAWN AEROBIC AND ANAEROBIC Blood Culture adequate volume   Culture   Final    NO GROWTH 3 DAYS Performed at [facility], [place] [address] [place], [place] [address]    Report Status PENDING  Incomplete    FURTHER DISCHARGE INSTRUCTIONS:  Get Medicines reviewed and adjusted: Please take all your medications with you for your next visit with your Primary MD  Laboratory/radiological data: Please request your Primary MD to go over all hospital tests and procedure/radiological results at the follow up, please ask your Primary MD to get all Hospital records sent to his/her office.  In some cases, they will be blood work, cultures and biopsy results pending at the time of your discharge. Please request that your primary care M.D. goes through all the records of your hospital data and follows up on these results.  Also Note the following: If you experience worsening of your admission symptoms, develop shortness of breath, life threatening emergency, suicidal or homicidal thoughts you must seek medical attention immediately by calling 911 or calling your MD immediately  if symptoms less severe.  You must read complete instructions/literature along with all the possible adverse reactions/side effects for all the Medicines you take and that have been prescribed to you. Take any new Medicines after you have completely understood and accpet all the possible adverse reactions/side effects.   Do not drive when taking Pain medications  or sleeping medications (Benzodaizepines)  Do not take more than prescribed Pain, Sleep and Anxiety Medications. It is not advisable to combine anxiety,sleep and pain medications without talking with your primary care practitioner  Special Instructions: If you have smoked or chewed Tobacco  in the last 2 yrs please stop smoking, stop any regular Alcohol  and or any Recreational drug use.  Wear Seat belts while  driving.  Please note: You were cared for by a hospitalist during your hospital stay. Once you are discharged, your primary care physician will handle any further medical issues. Please note that NO REFILLS for any discharge medications will be authorized once you are discharged, as it is imperative that you return to your primary care physician (or establish a relationship with a primary care physician if you do not have one) for your post hospital discharge needs so that they can reassess your need for medications and monitor your lab values.  Total Time spent coordinating discharge including counseling, education and face to face time equals 35 minutes.  SignedOren Binet 04/27/2018 9:23 AM

## 2018-04-27 NOTE — Discharge Instructions (Signed)

## 2018-04-27 NOTE — Progress Notes (Signed)
Pt for discharge going home, daughter in law at the bedside, given health teachings, next appt, due med explained and understood, discontinued peripheral IV line, no complain of pain at this time, able to walk going to the bathroom, tolerates her meal.

## 2018-04-28 LAB — CULTURE, BLOOD (ROUTINE X 2)
Culture: NO GROWTH
Culture: NO GROWTH
Special Requests: ADEQUATE

## 2018-06-21 ENCOUNTER — Ambulatory Visit: Payer: Medicare Other | Admitting: Cardiology

## 2018-06-21 ENCOUNTER — Encounter: Payer: Self-pay | Admitting: Cardiology

## 2018-06-21 VITALS — BP 157/78 | HR 66 | Ht 59.0 in | Wt 101.0 lb

## 2018-06-21 DIAGNOSIS — I2510 Atherosclerotic heart disease of native coronary artery without angina pectoris: Secondary | ICD-10-CM | POA: Diagnosis not present

## 2018-06-21 DIAGNOSIS — I5032 Chronic diastolic (congestive) heart failure: Secondary | ICD-10-CM

## 2018-06-21 DIAGNOSIS — I10 Essential (primary) hypertension: Secondary | ICD-10-CM | POA: Diagnosis not present

## 2018-06-21 DIAGNOSIS — E119 Type 2 diabetes mellitus without complications: Secondary | ICD-10-CM

## 2018-06-21 NOTE — Progress Notes (Signed)
Subjective:   Jade [name], female    DOB: 1927/05/08, 85 y.o.   MRN: [id number]  Deland Pretty, MD:  Chief Complaint  Patient presents with  . Congestive Heart Failure    f/u tests    HPI: Jade [name]  is a 85 y.o. female  with diastolic CHF, three-vessel coronary artery calcification on CT scan, type 2 diabetes, hypertension, hypercholesterolemia, hypothyroidism, and bell's palsy on the right side of her face many years ago.   She was noted to bilateral lung base crepitations by her PCP that improved with diuretic therapy and BNP also improved with this. She underwent lexiscan nuclear stress test and echocardiogram and now presents to discuss results.   Patient reports that she is overall feeling well. Denies any complaints today.She denies shortness of breath and there is no orthopnea or PND. Patient has been fairly active, living independently and also working in the yard etc. She has rare palpitation and occasional dizziness on getting up fast. No history of near syncope or syncope.       Past Medical History:  Diagnosis Date  . Allergic rhinitis   . Bronchitis   . Cataract   . CKD (chronic kidney disease)    stage 3 kidney failure per pt  . Diabetes mellitus   . Diverticulosis 11/2010  . GERD (gastroesophageal reflux disease)   . Glaucoma   . Glaucoma   . Hyperlipidemia   . Hypertension   . Hypothyroid   . Internal hemorrhoids   . Tubular adenoma polyp of rectum 02/2002  . Type 2 diabetes mellitus ([place])     Past Surgical History:  Procedure Laterality Date  . APPENDECTOMY    . BREAST CYST EXCISION    . CHOLECYSTECTOMY    . COLONOSCOPY    . DIVERTICULITIS  2004   SURGERY FOR DIVERTICULITIS PER PT.  Marland Kitchen HERNIA REPAIR     w/ mesh  . POLYPECTOMY    . TONSILLECTOMY    . TOTAL ABDOMINAL HYSTERECTOMY W/ BILATERAL SALPINGOOPHORECTOMY      Family History  Problem Relation Age of Onset  . Lung cancer Father   . Heart disease Mother   .  Heart disease Brother   . Diabetes Maternal Aunt   . Diabetes Maternal Uncle   . Diabetes Brother   . Colon cancer Neg Hx     Social History   Socioeconomic History  . Marital status: Widowed    Spouse name: Not on file  . Number of children: Not on file  . Years of education: Not on file  . Highest education level: Not on file  Occupational History  . Not on file  Social Needs  . Financial resource strain: Not on file  . Food insecurity:    Worry: Not on file    Inability: Not on file  . Transportation needs:    Medical: Not on file    Non-medical: Not on file  Tobacco Use  . Smoking status: Never Smoker  . Smokeless tobacco: Never Used  Substance and Sexual Activity  . Alcohol use: No  . Drug use: No  . Sexual activity: Not on file  Lifestyle  . Physical activity:    Days per week: Not on file    Minutes per session: Not on file  . Stress: Not on file  Relationships  . Social connections:    Talks on phone: Not on file    Gets together: Not on file  Attends religious service: Not on file    Active member of club or organization: Not on file    Attends meetings of clubs or organizations: Not on file    Relationship status: Not on file  . Intimate partner violence:    Fear of current or ex partner: Not on file    Emotionally abused: Not on file    Physically abused: Not on file    Forced sexual activity: Not on file  Other Topics Concern  . Not on file  Social History Narrative  . Not on file    Current Meds  Medication Sig  . aspirin 81 MG tablet Take 81 mg by mouth daily.    Marland Kitchen atorvastatin (LIPITOR) 10 MG tablet Take 5 mg by mouth daily at 6 PM.   . Cholecalciferol (VITAMIN D) 1000 UNITS capsule Take 1,000 Units by mouth daily.    . Coenzyme Q10 (COQ10) 50 MG CAPS Take 1 capsule by mouth daily.    . fish oil-omega-3 fatty acids 1000 MG capsule Take 1 capsule by mouth daily.    Marland Kitchen FREESTYLE LITE test strip TEST QAM  . lactobacillus acidophilus (BACID)  TABS tablet Take 2 tablets by mouth 3 (three) times daily. Probiotic  . levothyroxine (SYNTHROID, LEVOTHROID) 75 MCG tablet Take 75 mcg by mouth daily.    . metFORMIN (GLUCOPHAGE) 500 MG tablet Take 1 tablet (500 mg total) by mouth daily with breakfast.  . olmesartan-hydrochlorothiazide (BENICAR HCT) 20-12.5 MG tablet Take 1 tablet by mouth daily.  . timolol (TIMOPTIC) 0.5 % ophthalmic solution Place 1 drop into both eyes 2 (two) times daily.      Review of Systems  Constitution: Negative for decreased appetite, malaise/fatigue, weight gain and weight loss.  Eyes: Negative for visual disturbance.  Cardiovascular: Positive for palpitations (rare). Negative for chest pain, claudication, dyspnea on exertion, leg swelling, orthopnea and syncope.  Respiratory: Negative for hemoptysis and wheezing.   Endocrine: Negative for cold intolerance and heat intolerance.  Hematologic/Lymphatic: Does not bruise/bleed easily.  Skin: Negative for nail changes.  Musculoskeletal: Negative for muscle weakness and myalgias.  Gastrointestinal: Negative for abdominal pain, change in bowel habit, nausea and vomiting.  Neurological: Positive for dizziness (occasional with sudden position changes). Negative for difficulty with concentration, focal weakness and headaches.  Psychiatric/Behavioral: Negative for altered mental status and suicidal ideas.  All other systems reviewed and are negative.      Objective:     Blood pressure (!) 157/78, pulse 66, height 4\' 11"  (1.499 m), weight 101 lb (45.8 kg), SpO2 97 %.  Echocardiogram 05/23/2018: Left ventricle cavity is normal in size. Mild concentric hypertrophy of the left ventricle. Normal global wall motion. Doppler evidence of grade I (impaired) diastolic dysfunction, elevated LAP. Calculated EF 67%. Left atrial cavity is mild to moderately dilated, LA volume of 5.0 cm. Mild (Grade I) mitral regurgitation. Mild tricuspid regurgitation. Mild pulmonary  hypertension. PAS pressure estimated at 31 mmHg, CVP 3 mmHg. Compared to the study done in 07/23/2010, left atrial size was previously normal, mild pulmonary hypertension is new.  Lexiscan myoview stress test 05/22/2018: 1. Lexiscan stress test was performed. Exercise capacity was not assessed. Stress symptoms included headache. Resting blood pressure was 160/78 mmHg and peak effect blood pressure was 128/56 mmHg. The resting and stress electrocardiogram demonstrated normal sinus rhythm, normal resting conduction, possible old anteroseptal infarct, no resting arrhythmias and normal rest repolarization. Stress EKG is non diagnostic for ischemia as it is a pharmacologic stress. 2. The overall quality  of the study is good. There is no evidence of abnormal lung activity. LV cavity is small. Stress SPECT images demonstrate homogeneous tracer distribution throughout the myocardium. Small inferior apical perfusion defect seen on rest images only likely represent tissue attenuation artifact. Gated SPECT imaging reveals normal myocardial thickening and wall motion. The left ventricular ejection fraction was normal (75%).  3. Low risk study.   Physical Exam  Constitutional: She is oriented to person, place, and time. Vital signs are normal. She appears well-developed and well-nourished.  HENT:  Head: Normocephalic and atraumatic.  Neck: Normal range of motion.  Cardiovascular: Normal rate, regular rhythm, normal heart sounds and intact distal pulses.  Pulmonary/Chest: Effort normal. No accessory muscle usage. No respiratory distress.  Crepitus noted in bilateral bases; otherwise, clear lung sounds  Abdominal: Soft. Bowel sounds are normal.  Musculoskeletal: Normal range of motion.  Neurological: She is alert and oriented to person, place, and time.  Skin: Skin is warm and dry.  Vitals reviewed.          Assessment & Recommendations:   Chronic diastolic (congestive) heart failure  (HCC)  Coronary artery calcification seen on CT scan  Essential hypertension  Type 2 diabetes mellitus without complication, without long-term current use of insulin ([place])  Plan:  I have discussed recently obtained test results with the patient and her daughter.  Although she has coronary calcification on CT scan of the abdomen, Lexiscan nuclear stress test was without evidence of ischemia.  She is essentially asymptomatic.  She does continue to have bilateral crepitus in bilateral bases on exam.  No clinical evidence of heart failure.  Her BNP had normalized at last check on 05/11/2018.  Has normal LVEF with grade 1 diastolic dysfunction by echocardiogram.  She was noted to have mildly elevated PA pressure compared to previous echocardiogram in 2012.  This was discussed with the patient and her daughter.  She denies any symptoms of dyspnea.  Would consider pulmonary evaluation given exam findings and for evaluation of possible interstitial lung disease.  In view of her advanced age and lack of symptoms, would recommend conservative measures.    She has history of hypertension, and blood pressure is generally well controlled.  Blood pressure is elevated today.  They will continue to monitor.  No changes were made to her medications today.  Diabetes is being managed by her PCP and she states is well controlled.  I will see her back on a as needed basis, but encouraged patient to contact me for any new or worsening problems.     Jeri Lager, FNP-C [facility], [place] Office: [phone] Fax: [phone]

## 2018-06-24 ENCOUNTER — Encounter: Payer: Self-pay | Admitting: Cardiology

## 2018-12-18 ENCOUNTER — Ambulatory Visit (INDEPENDENT_AMBULATORY_CARE_PROVIDER_SITE_OTHER): Payer: Medicare Other | Admitting: Cardiology

## 2018-12-18 ENCOUNTER — Encounter: Payer: Self-pay | Admitting: Cardiology

## 2018-12-18 ENCOUNTER — Other Ambulatory Visit: Payer: Self-pay | Admitting: Cardiology

## 2018-12-18 ENCOUNTER — Other Ambulatory Visit: Payer: Self-pay

## 2018-12-18 VITALS — BP 150/75 | HR 67 | Temp 98.2°F | Ht 59.0 in | Wt 104.4 lb

## 2018-12-18 DIAGNOSIS — I5032 Chronic diastolic (congestive) heart failure: Secondary | ICD-10-CM | POA: Diagnosis not present

## 2018-12-18 DIAGNOSIS — N183 Chronic kidney disease, stage 3 unspecified: Secondary | ICD-10-CM

## 2018-12-18 DIAGNOSIS — R600 Localized edema: Secondary | ICD-10-CM

## 2018-12-18 DIAGNOSIS — I10 Essential (primary) hypertension: Secondary | ICD-10-CM | POA: Diagnosis not present

## 2018-12-18 MED ORDER — SPIRONOLACTONE 25 MG PO TABS: 25.0000 mg | ORAL_TABLET | Freq: Every day | ORAL | 1 refills | Status: DC

## 2018-12-18 NOTE — Progress Notes (Signed)
Primary Physician:  Deland Pretty, MD   Patient ID: Jade [name], female    DOB: 10-14-1927, 85 y.o.   MRN: [id number]  Subjective:    Chief Complaint  Patient presents with  . Leg Swelling  . Follow-up    HPI: Jade [name]  is a 85 y.o. female  with diastolic CHF, three-vessel coronary artery calcification on CT scan, type 2 diabetes, hypertension, hypercholesterolemia, hypothyroidism, and bell's palsy on the right side of her face many years ago.   She was noted to bilateral lung base crepitations by her PCP that improved with diuretic therapy and BNP also improved with this. She underwent lexiscan nuclear stress test in Jan 2020 that was considered low risk study. Echocardiogram also at that time showed grade 1 diastolic dysfunction and mild pulmonary hypertension. Conservative measures were recommended in view of lack of symptoms.   Over the last few months, she has had bilateral leg swelling that goes down overnight, but worsened by the end of the day. She states PCP has made changes to medications, but she continues to have issues with this. She has been using support stockings almost daily and has noticed some improvement with this. States that she is very careful with her diet. She states awhile ago she had some chest pain, but has not had any recurrence lately. Denies any shortness of breath. No PND or orthopnea. She reports having labs performed with PCP recently. Has CKD stage 3.   Patient is fairly active, she continues to live independently. Does her own yard work and household chores.    Past Medical History:  Diagnosis Date  . Allergic rhinitis   . Bronchitis   . Cataract   . CKD (chronic kidney disease)    stage 3 kidney failure per pt  . Diabetes mellitus   . Diverticulosis 11/2010  . GERD (gastroesophageal reflux disease)   . Glaucoma   . Glaucoma   . Hyperlipidemia   . Hypertension   . Hypothyroid   . Internal hemorrhoids   . Tubular adenoma  polyp of rectum 02/2002  . Type 2 diabetes mellitus ([place])     Past Surgical History:  Procedure Laterality Date  . APPENDECTOMY    . BREAST CYST EXCISION    . CHOLECYSTECTOMY    . COLONOSCOPY    . DIVERTICULITIS  2004   SURGERY FOR DIVERTICULITIS PER PT.  Marland Kitchen HERNIA REPAIR     w/ mesh  . POLYPECTOMY    . TONSILLECTOMY    . TOTAL ABDOMINAL HYSTERECTOMY W/ BILATERAL SALPINGOOPHORECTOMY      Social History   Socioeconomic History  . Marital status: Widowed    Spouse name: Not on file  . Number of children: 2  . Years of education: Not on file  . Highest education level: Not on file  Occupational History  . Not on file  Social Needs  . Financial resource strain: Not on file  . Food insecurity    Worry: Not on file    Inability: Not on file  . Transportation needs    Medical: Not on file    Non-medical: Not on file  Tobacco Use  . Smoking status: Never Smoker  . Smokeless tobacco: Never Used  Substance and Sexual Activity  . Alcohol use: No  . Drug use: No  . Sexual activity: Not on file  Lifestyle  . Physical activity    Days per week: Not on file    Minutes per session: Not on file  .  Stress: Not on file  Relationships  . Social Herbalist on phone: Not on file    Gets together: Not on file    Attends religious service: Not on file    Active member of club or organization: Not on file    Attends meetings of clubs or organizations: Not on file    Relationship status: Not on file  . Intimate partner violence    Fear of current or ex partner: Not on file    Emotionally abused: Not on file    Physically abused: Not on file    Forced sexual activity: Not on file  Other Topics Concern  . Not on file  Social History Narrative  . Not on file    Review of Systems  Constitution: Negative for decreased appetite, malaise/fatigue, weight gain and weight loss.  Eyes: Negative for visual disturbance.  Cardiovascular: Positive for leg swelling and  palpitations (rare). Negative for chest pain, claudication, dyspnea on exertion, orthopnea and syncope.  Respiratory: Negative for hemoptysis and wheezing.   Endocrine: Negative for cold intolerance and heat intolerance.  Hematologic/Lymphatic: Does not bruise/bleed easily.  Skin: Negative for nail changes.  Musculoskeletal: Negative for muscle weakness and myalgias.  Gastrointestinal: Negative for abdominal pain, change in bowel habit, nausea and vomiting.  Neurological: Positive for dizziness (occasional with sudden position changes). Negative for difficulty with concentration, focal weakness and headaches.  Psychiatric/Behavioral: Negative for altered mental status and suicidal ideas.  All other systems reviewed and are negative.     Objective:  Blood pressure (!) 150/75, pulse 67, temperature 98.2 F (36.8 C), height _0  (1.499 m), weight 104 lb 6.4 oz (47.4 kg), SpO2 95 %. Body mass index is 21.09 kg/m.    Physical Exam  Constitutional: She is oriented to person, place, and time. Vital signs are normal. She appears well-developed and well-nourished.  HENT:  Head: Normocephalic and atraumatic.  Neck: Normal range of motion.  Cardiovascular: Normal rate, regular rhythm, normal heart sounds and intact distal pulses.  1+ pitting edema  Pulmonary/Chest: Effort normal. No accessory muscle usage. No respiratory distress.  Crepitus noted in bilateral bases; otherwise, clear lung sounds  Abdominal: Soft. Bowel sounds are normal.  Musculoskeletal: Normal range of motion.  Neurological: She is alert and oriented to person, place, and time.  Skin: Skin is warm and dry.  Vitals reviewed.  Radiology: No results found.  Laboratory examination:   09/28/2018: Creatinine 1.1, EGFR 51/62, potassium 3.5, BMP otherwise normal.  CMP Latest Ref Rng & Units 04/27/2018 04/26/2018 04/25/2018  Glucose 70 - 99 mg/dL 99 88 98  BUN 8 - 23 mg/dL 22 29(H) 27(H)  Creatinine 0.44 - 1.00 mg/dL 1.17(H)  1.26(H) 1.52(H)  Sodium 135 - 145 mmol/L 135 133(L) 136  Potassium 3.5 - 5.1 mmol/L 4.0 3.8 3.2(L)  Chloride 98 - 111 mmol/L 102 100 100  CO2 22 - 32 mmol/L _1 Calcium 8.9 - 10.3 mg/dL 9.2 9.3 9.3  Total Protein 6.5 - 8.1 g/dL - - -  Total Bilirubin 0.3 - 1.2 mg/dL - - -  Alkaline Phos 38 - 126 U/L - - -  AST 15 - 41 U/L - - -  ALT 0 - 44 U/L - - -   CBC Latest Ref Rng & Units 04/27/2018 04/25/2018 04/24/2018  WBC 4.0 - 10.5 K/uL 6.2 10.2 17.0(H)  Hemoglobin 12.0 - 15.0 g/dL 9.9(L) 9.8(L) 10.7(L)  Hematocrit 36.0 - 46.0 % 28.8(L) 29.7(L) 32.2(L)  Platelets 150 -  400 K/uL 275 215 225   Lipid Panel  No results found for: CHOL, TRIG, HDL, CHOLHDL, VLDL, LDLCALC, LDLDIRECT HEMOGLOBIN A1C No results found for: HGBA1C, MPG TSH No results for input(s): TSH in the last 8760 hours.  PRN Meds:. Medications Discontinued During This Encounter  Medication Reason  . olmesartan-hydrochlorothiazide (BENICAR HCT) 20-12.5 MG tablet Discontinued by provider   Current Meds  Medication Sig  . amLODipine (NORVASC) 5 MG tablet Take 1 tablet by mouth daily. 1/2  . aspirin 81 MG tablet Take 81 mg by mouth daily.    Marland Kitchen atorvastatin (LIPITOR) 10 MG tablet Take 5 mg by mouth daily at 6 PM.   . Cholecalciferol (VITAMIN D) 1000 UNITS capsule Take 1,000 Units by mouth daily.    . ciprofloxacin (CILOXAN) 0.3 % ophthalmic solution Place 4 drops into both ears 2 (two) times daily.  . Coenzyme Q10 (COQ10) 50 MG CAPS Take 1 capsule by mouth daily.    . fish oil-omega-3 fatty acids 1000 MG capsule Take 1 capsule by mouth daily.    Marland Kitchen FREESTYLE LITE test strip TEST QAM  . hydrochlorothiazide (MICROZIDE) 12.5 MG capsule Take 1 capsule by mouth daily. 1/2  . hydrochlorothiazide (MICROZIDE) 12.5 MG capsule Take 12.5 mg by mouth daily. Take 1/2 tablet daily  . lactobacillus acidophilus (BACID) TABS tablet Take 2 tablets by mouth 3 (three) times daily. Probiotic  . levothyroxine (SYNTHROID, LEVOTHROID) 75 MCG  tablet Take 75 mcg by mouth daily.    . metFORMIN (GLUCOPHAGE) 500 MG tablet Take 1 tablet (500 mg total) by mouth daily with breakfast.  . olmesartan (BENICAR) 40 MG tablet Take 1 tablet by mouth daily.  . timolol (TIMOPTIC) 0.5 % ophthalmic solution Place 1 drop into both eyes 2 (two) times daily.   . [DISCONTINUED] olmesartan-hydrochlorothiazide (BENICAR HCT) 20-12.5 MG tablet Take 1 tablet by mouth daily.    Cardiac Studies:   Echocardiogram 05/23/2018: Left ventricle cavity is normal in size. Mild concentric hypertrophy of the left ventricle. Normal global wall motion. Doppler evidence of grade I (impaired) diastolic dysfunction, elevated LAP. Calculated EF 67%. Left atrial cavity is mild to moderately dilated, LA volume of 5.0 cm. Mild (Grade I) mitral regurgitation. Mild tricuspid regurgitation. Mild pulmonary hypertension. PAS pressure estimated at 31 mmHg, CVP 3 mmHg. Compared to the study done in 07/23/2010, left atrial size was previously normal, mild pulmonary hypertension is new.  Lexiscan myoview stress test 05/22/2018: 1. Lexiscan stress test was performed. Exercise capacity was not assessed. Stress symptoms included headache. Resting blood pressure was 160/78 mmHg and peak effect blood pressure was 128/56 mmHg. The resting and stress electrocardiogram demonstrated normal sinus rhythm, normal resting conduction, possible old anteroseptal infarct, no resting arrhythmias and normal rest repolarization. Stress EKG is non diagnostic for ischemia as it is a pharmacologic stress. 2. The overall quality of the study is good. There is no evidence of abnormal lung activity. LV cavity is small. Stress SPECT images demonstrate homogeneous tracer distribution throughout the myocardium. Small inferior apical perfusion defect seen on rest images only likely represent tissue attenuation artifact. Gated SPECT imaging reveals normal myocardial thickening and wall motion. The left ventricular  ejection fraction was normal (75%).  3. Low risk study.  Assessment:   Chronic diastolic (congestive) heart failure (HCC) - Plan: EKG 12-Lead  Essential hypertension  Bilateral leg edema  CKD (chronic kidney disease) stage 3, GFR 30-59 ml/min (HCC)  EKG 05/16/2018: Sinus bradycardia, HR-54/m, first-degree AV block.  Recommendations:   Patient has had bilateral  leg swelling for the last several months that has improved slightly with use of support stockings.  I suspect her leg edema is related to venous insufficiency and potentially diastolic dysfunction.  There are some questions to her medications.  I do not have recent labs from PCP office, we'll request for records and evaluation.  She reports chronic kidney disease stage III.  If kidney function will allow, we'll add Aldactone 25 mg daily. She will need repeat BMP in 2 weeks for surveillance. She did not have significant improvement in leg swelling with decreased his amlodipine.  Her blood pressure is also elevated today, hopefully help with this.  He denies any chest pain or shortness of breath, she has been very active with maintaining her yard and house without any difficulty. I'll see her back in 4 weeks for follow-up on leg edema.  Addendum: Kidney function is stable, will add aldactone. Will repeat BMP in 2 weeks.   Miquel Dunn, MSN, APRN, FNP-C [facility]. [place] Office: [phone] Fax: [phone]

## 2019-01-22 ENCOUNTER — Other Ambulatory Visit: Payer: Self-pay

## 2019-01-22 ENCOUNTER — Encounter: Payer: Self-pay | Admitting: Cardiology

## 2019-01-22 ENCOUNTER — Ambulatory Visit: Payer: Medicare Other | Admitting: Cardiology

## 2019-01-22 VITALS — BP 130/72 | HR 68 | Temp 98.0°F | Ht 59.0 in | Wt 99.6 lb

## 2019-01-22 DIAGNOSIS — I129 Hypertensive chronic kidney disease with stage 1 through stage 4 chronic kidney disease, or unspecified chronic kidney disease: Secondary | ICD-10-CM

## 2019-01-22 DIAGNOSIS — R600 Localized edema: Secondary | ICD-10-CM

## 2019-01-22 DIAGNOSIS — N183 Chronic kidney disease, stage 3 unspecified: Secondary | ICD-10-CM

## 2019-01-22 DIAGNOSIS — I5032 Chronic diastolic (congestive) heart failure: Secondary | ICD-10-CM | POA: Diagnosis not present

## 2019-01-22 DIAGNOSIS — I10 Essential (primary) hypertension: Secondary | ICD-10-CM

## 2019-01-22 NOTE — Progress Notes (Signed)
Primary Physician:  Deland Pretty, MD   Patient ID: Jade [name], female    DOB: 13-Jan-1928, 85 y.o.   MRN: [id number]  Subjective:    Chief Complaint  Patient presents with  . Hypertension  . Edema  . Follow-up    4wk    HPI: Jade [name]  is a 85 y.o. female  with diastolic CHF, three-vessel coronary artery calcification on CT scan, type 2 diabetes, hypertension, hypercholesterolemia, hypothyroidism, and bell's palsy on the right side of her face many years ago.   She was noted to bilateral lung base crepitations by her PCP that improved with diuretic therapy and BNP also improved with this. She underwent lexiscan nuclear stress test in Jan 2020 that was considered low risk study. Echocardiogram also at that time showed grade 1 diastolic dysfunction and mild pulmonary hypertension. Conservative measures were recommended in view of lack of symptoms.   Recently seen for worsening leg swelling, particularly at the end of the day. She was started on Aldactone at her last visit and placed on support stockings. She has had improvement since being on this.  Denies any shortness of breath. No PND or orthopnea. She reports having labs performed with PCP recently. Has CKD stage 3.   Patient is fairly active, she continues to live independently. Does her own yard work and household chores.    Past Medical History:  Diagnosis Date  . Allergic rhinitis   . Bronchitis   . Cataract   . CKD (chronic kidney disease)    stage 3 kidney failure per pt  . Diabetes mellitus   . Diverticulosis 11/2010  . GERD (gastroesophageal reflux disease)   . Glaucoma   . Glaucoma   . Hyperlipidemia   . Hypertension   . Hypothyroid   . Internal hemorrhoids   . Tubular adenoma polyp of rectum 02/2002  . Type 2 diabetes mellitus ([place])     Past Surgical History:  Procedure Laterality Date  . APPENDECTOMY    . BREAST CYST EXCISION    . CHOLECYSTECTOMY    . COLONOSCOPY    . DIVERTICULITIS   2004   SURGERY FOR DIVERTICULITIS PER PT.  Marland Kitchen HERNIA REPAIR     w/ mesh  . POLYPECTOMY    . TONSILLECTOMY    . TOTAL ABDOMINAL HYSTERECTOMY W/ BILATERAL SALPINGOOPHORECTOMY      Social History   Socioeconomic History  . Marital status: Widowed    Spouse name: Not on file  . Number of children: 2  . Years of education: Not on file  . Highest education level: Not on file  Occupational History  . Not on file  Social Needs  . Financial resource strain: Not on file  . Food insecurity    Worry: Not on file    Inability: Not on file  . Transportation needs    Medical: Not on file    Non-medical: Not on file  Tobacco Use  . Smoking status: Never Smoker  . Smokeless tobacco: Never Used  Substance and Sexual Activity  . Alcohol use: No  . Drug use: No  . Sexual activity: Not on file  Lifestyle  . Physical activity    Days per week: Not on file    Minutes per session: Not on file  . Stress: Not on file  Relationships  . Social Herbalist on phone: Not on file    Gets together: Not on file    Attends religious service: Not on  file    Active member of club or organization: Not on file    Attends meetings of clubs or organizations: Not on file    Relationship status: Not on file  . Intimate partner violence    Fear of current or ex partner: Not on file    Emotionally abused: Not on file    Physically abused: Not on file    Forced sexual activity: Not on file  Other Topics Concern  . Not on file  Social History Narrative  . Not on file    Review of Systems  Constitution: Negative for decreased appetite, malaise/fatigue, weight gain and weight loss.  Eyes: Negative for visual disturbance.  Cardiovascular: Positive for leg swelling and palpitations (rare). Negative for chest pain, claudication, dyspnea on exertion, orthopnea and syncope.  Respiratory: Negative for hemoptysis and wheezing.   Endocrine: Negative for cold intolerance and heat intolerance.   Hematologic/Lymphatic: Does not bruise/bleed easily.  Skin: Negative for nail changes.  Musculoskeletal: Negative for muscle weakness and myalgias.  Gastrointestinal: Negative for abdominal pain, change in bowel habit, nausea and vomiting.  Neurological: Positive for dizziness (occasional with sudden position changes). Negative for difficulty with concentration, focal weakness and headaches.  Psychiatric/Behavioral: Negative for altered mental status and suicidal ideas.  All other systems reviewed and are negative.     Objective:  Blood pressure 130/72, pulse 68, temperature 98 F (36.7 C), height '4\' 11"'$  (1.499 m), weight 99 lb 9.6 oz (45.2 kg). Body mass index is 20.12 kg/m.    Physical Exam  Constitutional: She is oriented to person, place, and time. Vital signs are normal. She appears well-developed and well-nourished.  HENT:  Head: Normocephalic and atraumatic.  Neck: Normal range of motion.  Cardiovascular: Normal rate, regular rhythm, normal heart sounds and intact distal pulses.  Trace edema  Pulmonary/Chest: Effort normal. No accessory muscle usage. No respiratory distress.  Crepitus noted in bilateral bases; otherwise, clear lung sounds  Abdominal: Soft. Bowel sounds are normal.  Musculoskeletal: Normal range of motion.  Neurological: She is alert and oriented to person, place, and time.  Skin: Skin is warm and dry.  Vitals reviewed.  Radiology: No results found.  Laboratory examination:   09/28/2018: Creatinine 1.1, EGFR 51/62, potassium 3.5, BMP otherwise normal.  CMP Latest Ref Rng & Units 01/22/2019 04/27/2018 04/26/2018  Glucose 65 - 99 mg/dL 96 99 88  BUN 8 - 27 mg/dL 36(H) 22 29(H)  Creatinine 0.57 - 1.00 mg/dL 1.18(H) 1.17(H) 1.26(H)  Sodium 134 - 144 mmol/L 136 135 133(L)  Potassium 3.5 - 5.2 mmol/L 4.9 4.0 3.8  Chloride 96 - 106 mmol/L 102 102 100  CO2 20 - 29 mmol/L '23 25 23  '$ Calcium 8.7 - 10.3 mg/dL 10.1 9.2 9.3  Total Protein 6.5 - 8.1 g/dL - - -  Total  Bilirubin 0.3 - 1.2 mg/dL - - -  Alkaline Phos 38 - 126 U/L - - -  AST 15 - 41 U/L - - -  ALT 0 - 44 U/L - - -   CBC Latest Ref Rng & Units 04/27/2018 04/25/2018 04/24/2018  WBC 4.0 - 10.5 K/uL 6.2 10.2 17.0(H)  Hemoglobin 12.0 - 15.0 g/dL 9.9(L) 9.8(L) 10.7(L)  Hematocrit 36.0 - 46.0 % 28.8(L) 29.7(L) 32.2(L)  Platelets 150 - 400 K/uL 275 215 225   Lipid Panel  No results found for: CHOL, TRIG, HDL, CHOLHDL, VLDL, LDLCALC, LDLDIRECT HEMOGLOBIN A1C No results found for: HGBA1C, MPG TSH No results for input(s): TSH in the last 8760  hours.  PRN Meds:. There are no discontinued medications. Current Meds  Medication Sig  . amLODipine (NORVASC) 2.5 MG tablet Take 2.5 mg by mouth daily.  Marland Kitchen aspirin 81 MG tablet Take 81 mg by mouth daily.    Marland Kitchen atorvastatin (LIPITOR) 10 MG tablet Take 5 mg by mouth daily at 6 PM.   . Cholecalciferol (VITAMIN D) 1000 UNITS capsule Take 1,000 Units by mouth daily.    . ciprofloxacin (CILOXAN) 0.3 % ophthalmic solution Place 4 drops into both ears as needed.   . Coenzyme Q10 (COQ10) 50 MG CAPS Take 1 capsule by mouth daily.    . fish oil-omega-3 fatty acids 1000 MG capsule Take 1 capsule by mouth daily.    Marland Kitchen FREESTYLE LITE test strip TEST QAM  . hydrochlorothiazide (MICROZIDE) 12.5 MG capsule Take 1 capsule by mouth daily. 1/2  . lactobacillus acidophilus (BACID) TABS tablet Take 1 tablet by mouth daily. Probiotic   . levothyroxine (SYNTHROID, LEVOTHROID) 75 MCG tablet Take 75 mcg by mouth daily.    . Melatonin 2.5 MG CAPS Take by mouth as needed (sleep).  . metFORMIN (GLUCOPHAGE) 500 MG tablet Take 1 tablet (500 mg total) by mouth daily with breakfast.  . methylcellulose oral powder Take by mouth daily.  . mupirocin ointment (BACTROBAN) 2 % as needed. nostrils  . olmesartan (BENICAR) 40 MG tablet Take 1 tablet by mouth daily.  Marland Kitchen spironolactone (ALDACTONE) 25 MG tablet TAKE 1 TABLET BY MOUTH EVERY DAY  . timolol (TIMOPTIC) 0.5 % ophthalmic solution Place 1  drop into both eyes 2 (two) times daily.     Cardiac Studies:   Echocardiogram 05/23/2018: Left ventricle cavity is normal in size. Mild concentric hypertrophy of the left ventricle. Normal global wall motion. Doppler evidence of grade I (impaired) diastolic dysfunction, elevated LAP. Calculated EF 67%. Left atrial cavity is mild to moderately dilated, LA volume of 5.0 cm. Mild (Grade I) mitral regurgitation. Mild tricuspid regurgitation. Mild pulmonary hypertension. PAS pressure estimated at 31 mmHg, CVP 3 mmHg. Compared to the study done in 07/23/2010, left atrial size was previously normal, mild pulmonary hypertension is new.  Lexiscan myoview stress test 05/22/2018: 1. Lexiscan stress test was performed. Exercise capacity was not assessed. Stress symptoms included headache. Resting blood pressure was 160/78 mmHg and peak effect blood pressure was 128/56 mmHg. The resting and stress electrocardiogram demonstrated normal sinus rhythm, normal resting conduction, possible old anteroseptal infarct, no resting arrhythmias and normal rest repolarization. Stress EKG is non diagnostic for ischemia as it is a pharmacologic stress. 2. The overall quality of the study is good. There is no evidence of abnormal lung activity. LV cavity is small. Stress SPECT images demonstrate homogeneous tracer distribution throughout the myocardium. Small inferior apical perfusion defect seen on rest images only likely represent tissue attenuation artifact. Gated SPECT imaging reveals normal myocardial thickening and wall motion. The left ventricular ejection fraction was normal (75%).  3. Low risk study.  Assessment:   Essential hypertension  Bilateral leg edema  Chronic diastolic (congestive) heart failure (HCC)  CKD (chronic kidney disease) stage 3, GFR 30-59 ml/min (HCC) - Plan: Basic metabolic panel  EKG 30/10/6224: Sinus bradycardia, HR-54/m, first-degree AV block.  Recommendations:   Since being on  Aldactone, blood pressure has significantly improved.  She is also had improvement in leg edema.  Advised her to continue with diet modifications to also help with this and also with wearing support stockings.  She will need BMP to follow-up on her kidney function as  she has history of stage III chronic kidney disease.  Despite her advanced age, patient is overall doing very well and is fairly active.  She is taking care of her yard own and reports weed eating for 3 hours 1 week ago without any exertional difficulty.  I will see her back in approximately 6 months for follow-up.  Miquel Dunn, MSN, APRN, FNP-C [facility]. [place] Office: [phone] Fax: [phone]

## 2019-01-23 LAB — BASIC METABOLIC PANEL
BUN/Creatinine Ratio: 31 — ABNORMAL HIGH (ref 12–28)
BUN: 36 mg/dL — ABNORMAL HIGH (ref 8–27)
CO2: 23 mmol/L (ref 20–29)
Calcium: 10.1 mg/dL (ref 8.7–10.3)
Chloride: 102 mmol/L (ref 96–106)
Creatinine, Ser: 1.18 mg/dL — ABNORMAL HIGH (ref 0.57–1.00)
GFR calc Af Amer: 47 mL/min/{1.73_m2} — ABNORMAL LOW (ref >59)
GFR calc non Af Amer: 41 mL/min/{1.73_m2} — ABNORMAL LOW (ref >59)
Glucose: 96 mg/dL (ref 65–99)
Potassium: 4.9 mmol/L (ref 3.5–5.2)
Sodium: 136 mmol/L (ref 134–144)

## 2019-01-23 NOTE — Progress Notes (Signed)
Pt was informed about lab results.

## 2019-01-25 ENCOUNTER — Telehealth: Payer: Self-pay

## 2019-01-25 NOTE — Telephone Encounter (Signed)
Pt aware she will call tomorrow to let us know if still low

## 2019-01-25 NOTE — Telephone Encounter (Signed)
Pt called stating her systolic is [id number] and she wants to know if she should still take her bp meds?

## 2019-02-19 ENCOUNTER — Encounter: Payer: Self-pay | Admitting: Podiatry

## 2019-02-19 ENCOUNTER — Ambulatory Visit: Payer: Medicare Other | Admitting: Podiatry

## 2019-02-19 ENCOUNTER — Other Ambulatory Visit: Payer: Self-pay

## 2019-02-19 ENCOUNTER — Ambulatory Visit (INDEPENDENT_AMBULATORY_CARE_PROVIDER_SITE_OTHER): Payer: Medicare Other

## 2019-02-19 VITALS — BP 132/67 | HR 72 | Resp 16

## 2019-02-19 DIAGNOSIS — B351 Tinea unguium: Secondary | ICD-10-CM

## 2019-02-19 DIAGNOSIS — M21619 Bunion of unspecified foot: Secondary | ICD-10-CM

## 2019-02-19 DIAGNOSIS — E1149 Type 2 diabetes mellitus with other diabetic neurological complication: Secondary | ICD-10-CM

## 2019-02-19 DIAGNOSIS — S99921A Unspecified injury of right foot, initial encounter: Secondary | ICD-10-CM

## 2019-02-19 DIAGNOSIS — M79676 Pain in unspecified toe(s): Secondary | ICD-10-CM

## 2019-02-19 NOTE — Progress Notes (Signed)
Subjective:   Patient ID: Jade [name], female   DOB: 85 y.o.   MRN: [id number]   HPI 85 year old female presents the office with concerns of pain to her right big toe.  She states the toenail fell off about 2 weeks ago.  She is not had any drainage she is overall is looking better but she cannot wear shoes as it is still uncomfortable.  She does state that she developed a pain in the pains on her toe about 1.5 months ago.  The toenail did turn purple for came off.  She has diabetic.  Last A1c was unknown.  Her blood sugars 1 is 87.  Denies any claudication symptoms.  She does state that she has neuropathy.   Review of Systems  All other systems reviewed and are negative.  Past Medical History:  Diagnosis Date  . Allergic rhinitis   . Bronchitis   . Cataract   . CKD (chronic kidney disease)    stage 3 kidney failure per pt  . Diabetes mellitus   . Diverticulosis 11/2010  . GERD (gastroesophageal reflux disease)   . Glaucoma   . Glaucoma   . Hyperlipidemia   . Hypertension   . Hypothyroid   . Internal hemorrhoids   . Tubular adenoma polyp of rectum 02/2002  . Type 2 diabetes mellitus ([place])     Past Surgical History:  Procedure Laterality Date  . APPENDECTOMY    . BREAST CYST EXCISION    . CHOLECYSTECTOMY    . COLONOSCOPY    . DIVERTICULITIS  2004   SURGERY FOR DIVERTICULITIS PER PT.  Marland Kitchen HERNIA REPAIR     w/ mesh  . POLYPECTOMY    . TONSILLECTOMY    . TOTAL ABDOMINAL HYSTERECTOMY W/ BILATERAL SALPINGOOPHORECTOMY       Current Outpatient Medications:  .  acetaminophen (TYLENOL) 500 MG tablet, Take by mouth., Disp: , Rfl:  .  amLODipine (NORVASC) 2.5 MG tablet, Take 2.5 mg by mouth daily., Disp: , Rfl:  .  amLODipine (NORVASC) 5 MG tablet, Take by mouth., Disp: , Rfl:  .  aspirin 81 MG tablet, Take 81 mg by mouth daily.  , Disp: , Rfl:  .  atorvastatin (LIPITOR) 10 MG tablet, Take 5 mg by mouth daily at 6 PM. , Disp: , Rfl:  .  atorvastatin (LIPITOR) 10 MG  tablet, Take by mouth., Disp: , Rfl:  .  Cholecalciferol (VITAMIN D) 1000 UNITS capsule, Take 1,000 Units by mouth daily.  , Disp: , Rfl:  .  Cholecalciferol (VITAMIN D-1000 MAX ST) 25 MCG (1000 UT) tablet, Take by mouth., Disp: , Rfl:  .  ciprofloxacin (CILOXAN) 0.3 % ophthalmic solution, Place 4 drops into both ears as needed. , Disp: , Rfl:  .  Coenzyme Q10 (COQ10) 50 MG CAPS, Take 1 capsule by mouth daily.  , Disp: , Rfl:  .  fish oil-omega-3 fatty acids 1000 MG capsule, Take 1 capsule by mouth daily.  , Disp: , Rfl:  .  FREESTYLE LITE test strip, TEST QAM, Disp: , Rfl: 11 .  glucose blood ([employer] TEST STRIPS) test strip, Check blood sugar once daily, Disp: , Rfl:  .  hydrochlorothiazide (HYDRODIURIL) 12.5 MG tablet, Take by mouth., Disp: , Rfl:  .  hydrochlorothiazide (MICROZIDE) 12.5 MG capsule, Take 1 capsule by mouth daily. 1/2, Disp: , Rfl:  .  lactobacillus acidophilus (BACID) TABS tablet, Take 1 tablet by mouth daily. Probiotic , Disp: , Rfl:  .  levothyroxine (SYNTHROID, LEVOTHROID) 75  MCG tablet, Take 75 mcg by mouth daily.  , Disp: , Rfl:  .  Melatonin 2.5 MG CAPS, Take by mouth as needed (sleep)., Disp: , Rfl:  .  Melatonin 5 MG CHEW, Chew by mouth., Disp: , Rfl:  .  metFORMIN (GLUCOPHAGE) 500 MG tablet, Take 1 tablet (500 mg total) by mouth daily with breakfast., Disp: , Rfl:  .  metFORMIN (GLUCOPHAGE) 500 MG tablet, Take by mouth., Disp: , Rfl:  .  methylcellulose (CITRUCEL) oral powder, Mix 1 tablespoonful in 8 ounces of water and drink once daily., Disp: , Rfl:  .  methylcellulose oral powder, Take by mouth daily., Disp: , Rfl:  .  mupirocin ointment (BACTROBAN) 2 %, as needed. nostrils, Disp: , Rfl:  .  olmesartan (BENICAR) 40 MG tablet, Take 1 tablet by mouth daily., Disp: , Rfl:  .  spironolactone (ALDACTONE) 25 MG tablet, TAKE 1 TABLET BY MOUTH EVERY DAY, Disp: 90 tablet, Rfl: 1 .  timolol (TIMOPTIC) 0.5 % ophthalmic solution, Place 1 drop into both eyes 2 (two) times  daily. , Disp: , Rfl: 11  Allergies  Allergen Reactions  . Codeine   . Miralax [Polyethylene Glycol] Swelling    Feet swelling  . Pentazocine Nausea And Vomiting    Patient experiences Flashing lights  . Pentazocine Lactate   . Tramadol Hcl          Objective:  Physical Exam  General: AAO x3, NAD  Dermatological: The proximal aspect of the nail still present.  There is mild incurvation of the distal aspect the nail and this is where she gets discomfort.  Mostly discomfort to the medial aspect.  There is no open lesions and there is no drainage or pus or any clinical signs of infection.      Vascular: Dorsalis Pedis artery and Posterior Tibial artery pedal pulses are 2/4 bilateral with immedate capillary fill time.  There is no pain with calf compression, swelling, warmth, erythema.   Neruologic: Sensation decreased with Semmes Weinstein monofilament.  Musculoskeletal: Significant bunion is present.  Muscular strength 5/5 in all groups tested bilateral.  Gait: Unassisted, Nonantalgic.       Assessment:   Right toenail injury, bunion     Plan:  -Treatment options discussed including all alternatives, risks, and complications -Etiology of symptoms were discussed -X-rays were obtained and reviewed with the patient. No obvious signs of acute fracture noted.  Chronic changes present of the hallux.  No osteomyelitis.   -Debrided the nail without any complications or bleeding.  I do think some of her pain is coming from the nail but also due to the rotation of the hallux and how she is putting pressure.  Dispensed offloading pads. -Monitor for any clinical signs or symptoms of infection and directed to call the office immediately should any occur or go to the ER.  Return in about 9 weeks (around 04/23/2019).  Routine care  Trula Slade DPM

## 2019-02-27 ENCOUNTER — Other Ambulatory Visit: Payer: Self-pay

## 2019-02-27 DIAGNOSIS — I10 Essential (primary) hypertension: Secondary | ICD-10-CM

## 2019-02-27 MED ORDER — SPIRONOLACTONE 25 MG PO TABS: 25.0000 mg | ORAL_TABLET | Freq: Every day | ORAL | 3 refills | Status: DC

## 2019-05-25 ENCOUNTER — Other Ambulatory Visit: Payer: Self-pay

## 2019-05-25 ENCOUNTER — Ambulatory Visit: Payer: Medicare Other | Admitting: Podiatry

## 2019-05-25 DIAGNOSIS — M205X9 Other deformities of toe(s) (acquired), unspecified foot: Secondary | ICD-10-CM

## 2019-05-25 DIAGNOSIS — M79674 Pain in right toe(s): Secondary | ICD-10-CM | POA: Diagnosis not present

## 2019-05-25 DIAGNOSIS — E1149 Type 2 diabetes mellitus with other diabetic neurological complication: Secondary | ICD-10-CM | POA: Diagnosis not present

## 2019-05-25 DIAGNOSIS — B351 Tinea unguium: Secondary | ICD-10-CM

## 2019-05-25 DIAGNOSIS — M79675 Pain in left toe(s): Secondary | ICD-10-CM

## 2019-05-25 NOTE — Patient Instructions (Signed)
Diabetes Mellitus and Foot Care Foot care is an important part of your health, especially when you have diabetes. Diabetes may cause you to have problems because of poor blood flow (circulation) to your feet and legs, which can cause your skin to:  Become thinner and drier.  Break more easily.  Heal more slowly.  Peel and crack. You may also have nerve damage (neuropathy) in your legs and feet, causing decreased feeling in them. This means that you may not notice minor injuries to your feet that could lead to more serious problems. Noticing and addressing any potential problems early is the best way to prevent future foot problems. How to care for your feet Foot hygiene  Wash your feet daily with warm water and mild soap. Do not use hot water. Then, pat your feet and the areas between your toes until they are completely dry. Do not soak your feet as this can dry your skin.  Trim your toenails straight across. Do not dig under them or around the cuticle. File the edges of your nails with an emery board or nail file.  Apply a moisturizing lotion or petroleum jelly to the skin on your feet and to dry, brittle toenails. Use lotion that does not contain alcohol and is unscented. Do not apply lotion between your toes. Shoes and socks  Wear clean socks or stockings every day. Make sure they are not too tight. Do not wear knee-high stockings since they may decrease blood flow to your legs.  Wear shoes that fit properly and have enough cushioning. Always look in your shoes before you put them on to be sure there are no objects inside.  To break in new shoes, wear them for just a few hours a day. This prevents injuries on your feet. Wounds, scrapes, corns, and calluses  Check your feet daily for blisters, cuts, bruises, sores, and redness. If you cannot see the bottom of your feet, use a mirror or ask someone for help.  Do not cut corns or calluses or try to remove them with medicine.  If you  find a minor scrape, cut, or break in the skin on your feet, keep it and the skin around it clean and dry. You may clean these areas with mild soap and water. Do not clean the area with peroxide, alcohol, or iodine.  If you have a wound, scrape, corn, or callus on your foot, look at it several times a day to make sure it is healing and not infected. Check for: ? Redness, swelling, or pain. ? Fluid or blood. ? Warmth. ? Pus or a bad smell. General instructions  Do not cross your legs. This may decrease blood flow to your feet.  Do not use heating pads or hot water bottles on your feet. They may burn your skin. If you have lost feeling in your feet or legs, you may not know this is happening until it is too late.  Protect your feet from hot and cold by wearing shoes, such as at the beach or on hot pavement.  Schedule a complete foot exam at least once a year (annually) or more often if you have foot problems. If you have foot problems, report any cuts, sores, or bruises to your health care provider immediately. Contact a health care provider if:  You have a medical condition that increases your risk of infection and you have any cuts, sores, or bruises on your feet.  You have an injury that is not   healing.  You have redness on your legs or feet.  You feel burning or tingling in your legs or feet.  You have pain or cramps in your legs and feet.  Your legs or feet are numb.  Your feet always feel cold.  You have pain around a toenail. Get help right away if:  You have a wound, scrape, corn, or callus on your foot and: ? You have pain, swelling, or redness that gets worse. ? You have fluid or blood coming from the wound, scrape, corn, or callus. ? Your wound, scrape, corn, or callus feels warm to the touch. ? You have pus or a bad smell coming from the wound, scrape, corn, or callus. ? You have a fever. ? You have a red line going up your leg. Summary  Check your feet every day  for cuts, sores, red spots, swelling, and blisters.  Moisturize feet and legs daily.  Wear shoes that fit properly and have enough cushioning.  If you have foot problems, report any cuts, sores, or bruises to your health care provider immediately.  Schedule a complete foot exam at least once a year (annually) or more often if you have foot problems. This information is not intended to replace advice given to you by your health care provider. Make sure you discuss any questions you have with your health care provider. Document Revised: 01/03/2019 Document Reviewed: 05/14/2016 Elsevier Patient Education  2020 [employer] Inc[employer]  

## 2019-05-28 ENCOUNTER — Encounter: Payer: Self-pay | Admitting: Podiatry

## 2019-05-28 NOTE — Progress Notes (Signed)
  Subjective:  Patient ID: Jade [name], female    DOB: Feb 21, 1928,  MRN: [id number]  Chief Complaint  Patient presents with  . Diabetes    9 week follow up  . Nail Problem    bilateral thick painful toenails  . Congestive Heart Failure   86 y.o. female returns for the above complaint.  Patient presents with painful thickened elongated toenails x10.  Patient states that they are getting long and is causing her pain when ambulating.  She denies any other acute complaints.  She states that she also needs a new refill on diabetic shoes as she has worn out the previous one and it has been past 1 year.  She denies any other acute complaints.  Objective:  There were no vitals filed for this visit. Podiatric Exam: Vascular: dorsalis pedis and posterior tibial pulses are palpable bilateral. Capillary return is immediate. Temperature gradient is WNL. Skin turgor WNL  Sensorium: Normal Semmes Weinstein monofilament test. Normal tactile sensation bilaterally. Nail Exam: Pt has thick disfigured discolored nails with subungual debris noted bilateral entire nail hallux through fifth toenails Ulcer Exam: There is no evidence of ulcer or pre-ulcerative changes or infection. Orthopedic Exam: Muscle tone and strength are WNL. No limitations in general ROM. No crepitus or effusions noted. HAV  B/L.  Hammer toes 2-5  B/L. Skin: No Porokeratosis. No infection or ulcers  Assessment & Plan:  Patient was evaluated and treated and all questions answered.  Onychomycosis with pain  -Nails palliatively debrided as below. -Educated on self-care  Hammertoe contractures bilaterally -I explained to the patient the etiology of hammertoe contractures and various treatment options associated with it.  I believe patient will benefit from diabetic shoes as she has deformities of the foot that are present with underlying diabetes. -Patient will be scheduled to see Liliane Channel for diabetic shoes.  Procedure: Nail  Debridement Rationale: pain  Type of Debridement: manual, sharp debridement. Instrumentation: Nail nipper, rotary burr. Number of Nails: 10  Procedures and Treatment: Consent by patient was obtained for treatment procedures. The patient understood the discussion of treatment and procedures well. All questions were answered thoroughly reviewed. Debridement of mycotic and hypertrophic toenails, 1 through 5 bilateral and clearing of subungual debris. No ulceration, no infection noted.  Return Visit-Office Procedure: Patient instructed to return to the office for a follow up visit 3 months for continued evaluation and treatment.  Boneta Lucks, DPM    Return in about 1 week (around 06/01/2019) for diabetic nail trim, Sched with Liliane Channel for [employer].

## 2019-05-31 ENCOUNTER — Encounter: Payer: Self-pay | Admitting: Gastroenterology

## 2019-06-11 ENCOUNTER — Other Ambulatory Visit: Payer: Medicare Other | Admitting: Orthotics

## 2019-06-14 IMAGING — DX DG CHEST 2V
2 series · 2 of 2 positions shown · non-contrast
Comparison: Radiographs 09/06/2013

CLINICAL DATA: Bilateral lower extremity edema for 3 days. Crackles
auscultated in bilateral lower lungs.

EXAM:
CHEST - 2 VIEW

[chest pa]
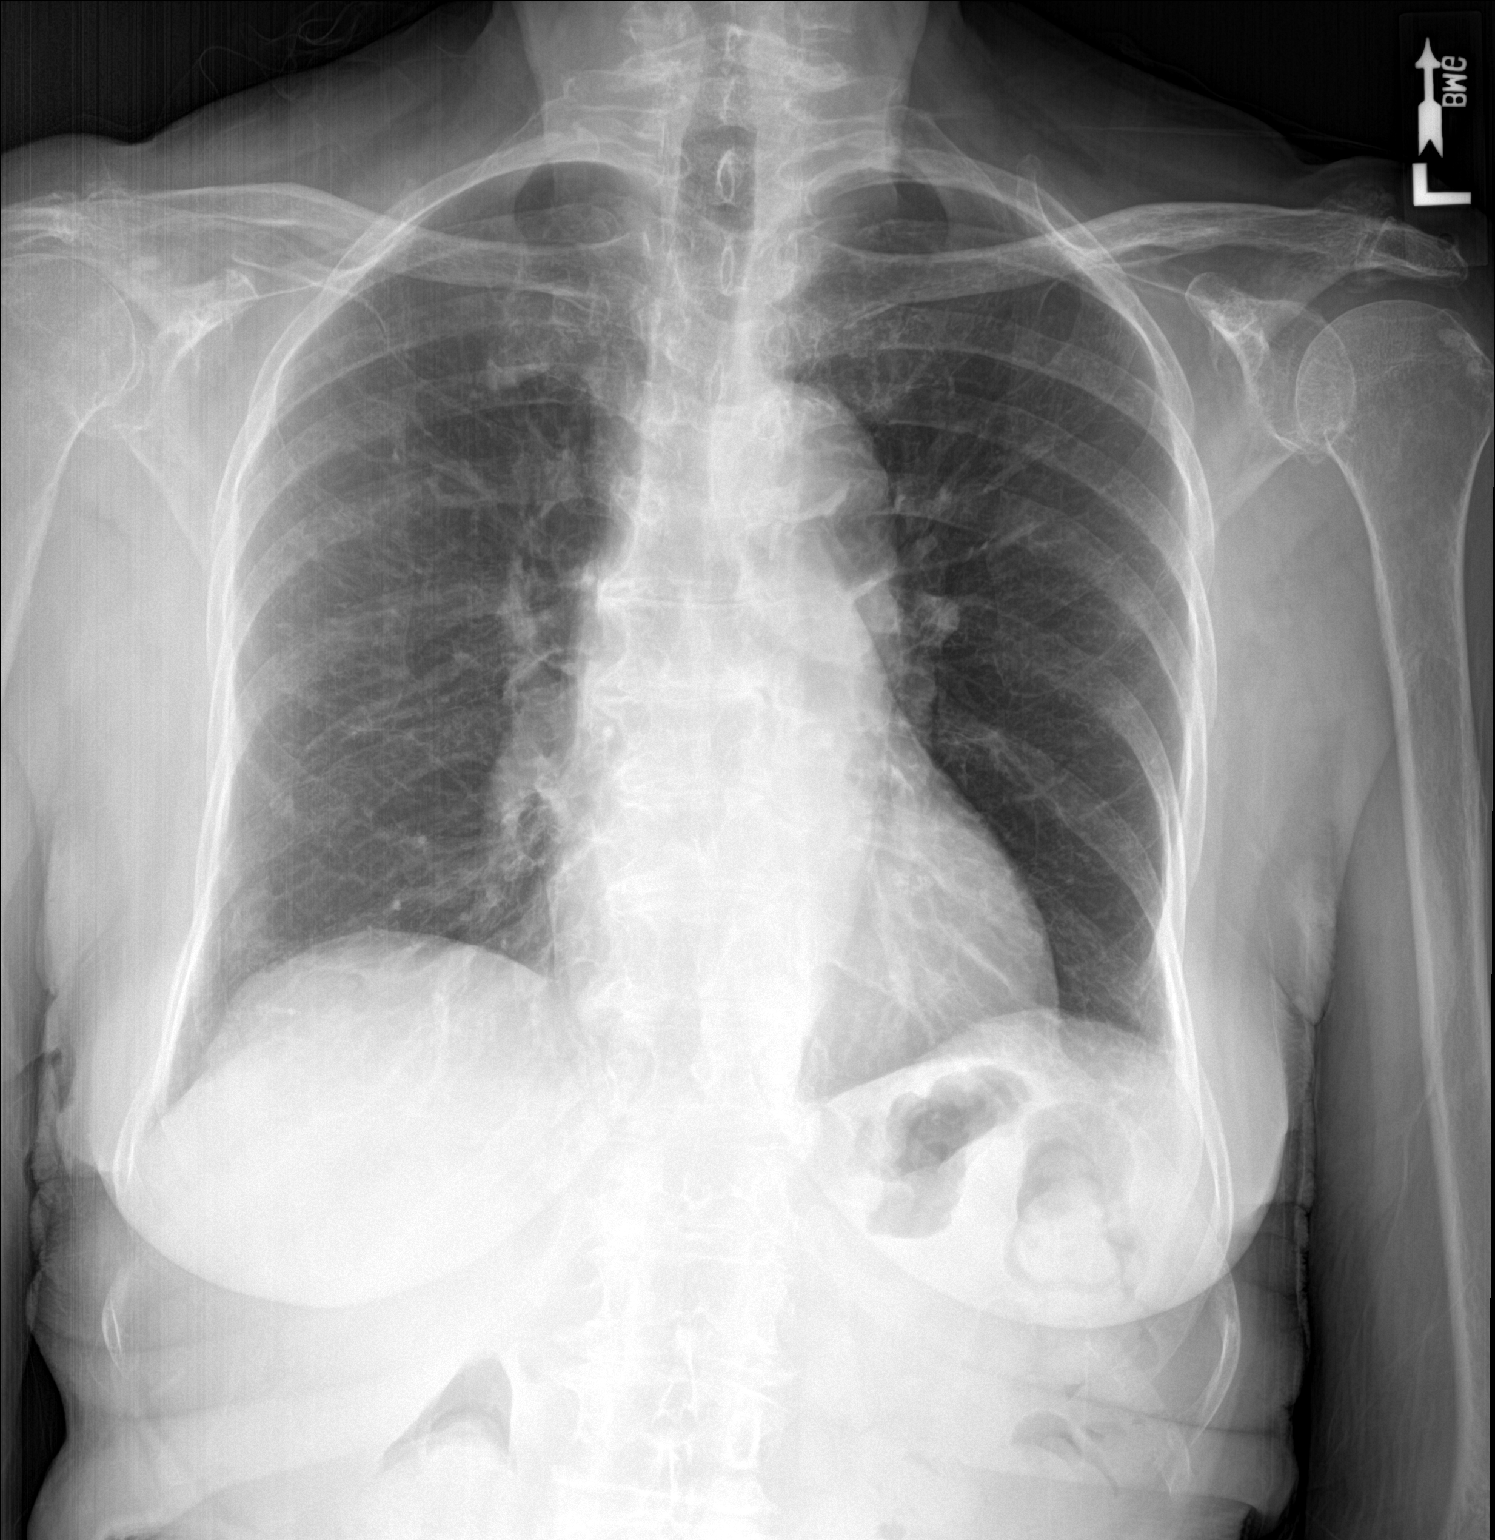

[chest lat]
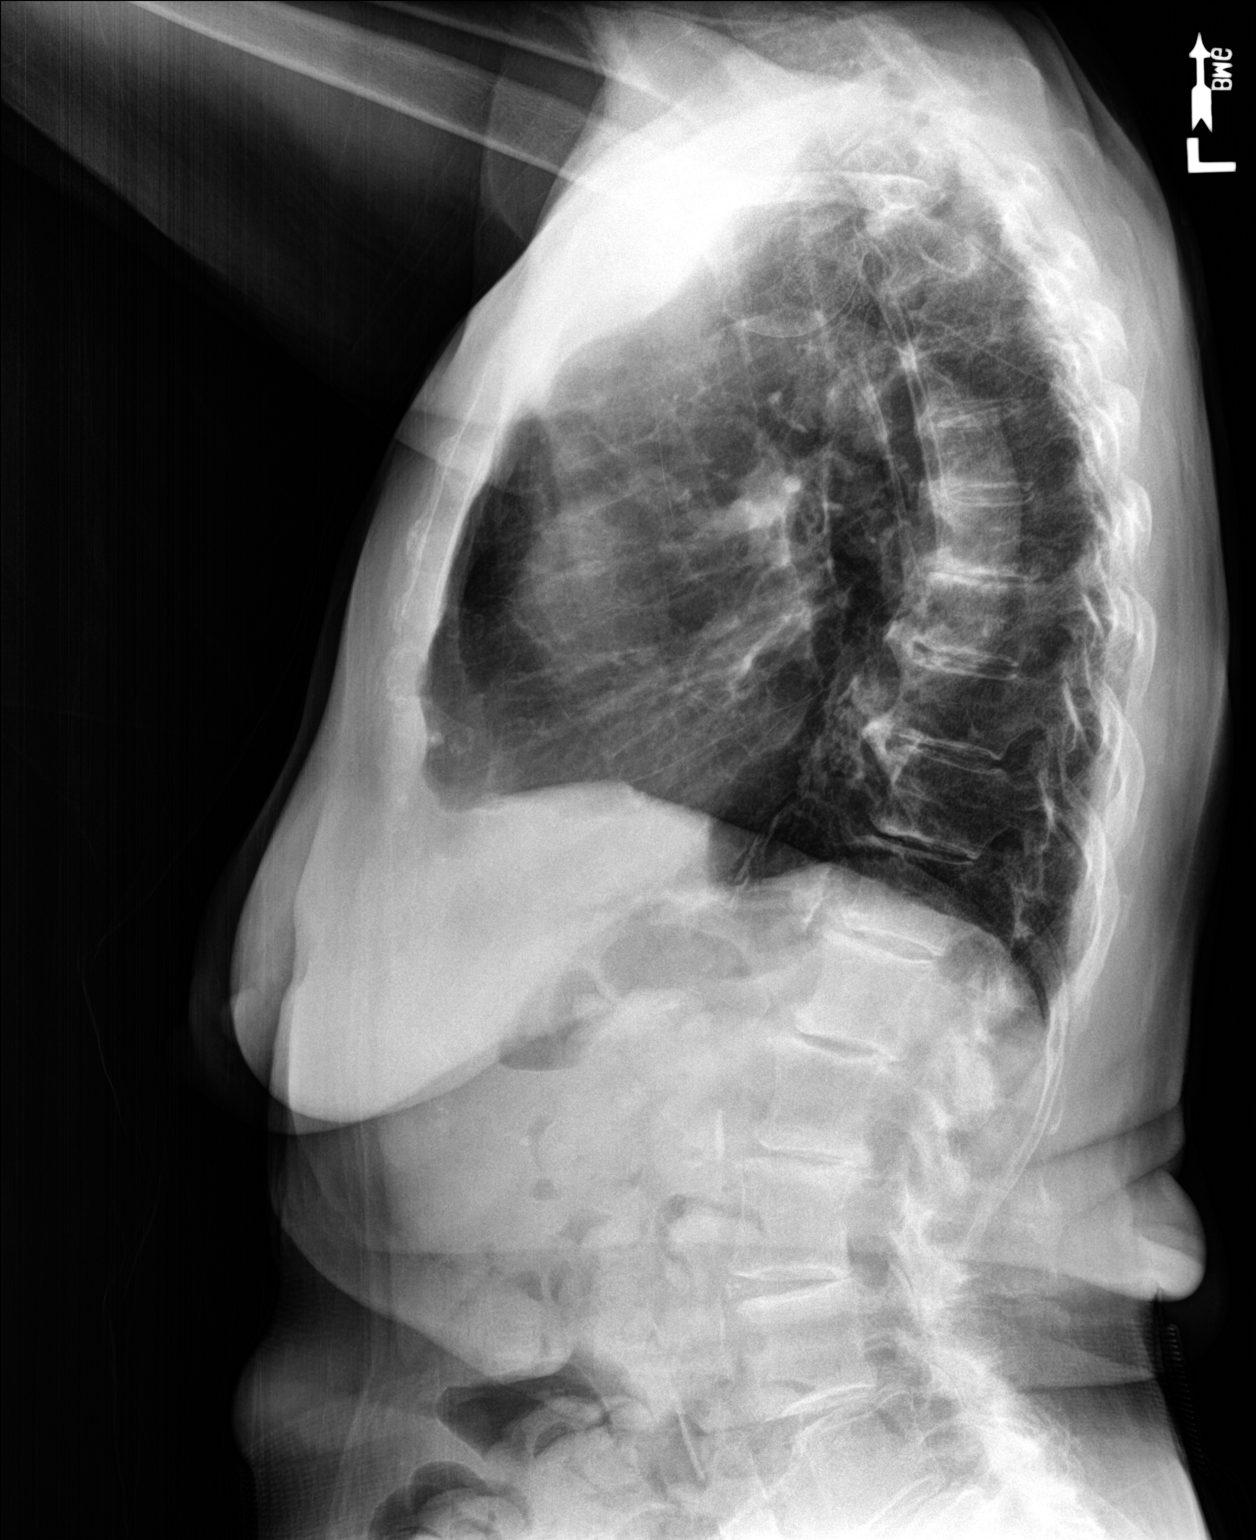

[2 of 2 positions shown; findings below may reference images not displayed]

FINDINGS: The heart is normal in size. Tortuosity and atherosclerosis of the
thoracic aorta, unchanged from prior exam. Pulmonary vasculature is
normal. Minimal left basilar atelectasis. No consolidation, pleural
effusion, or pneumothorax. No acute osseous abnormalities are seen.
Chronic degenerative change of the right shoulder.
IMPRESSION: 1. Normal pulmonary vasculature. No evidence of fluid overload or
CHF.
2. Mild left lung base subsegmental atelectasis.
3.  Aortic Atherosclerosis ([id number]).

## 2019-06-19 ENCOUNTER — Ambulatory Visit: Payer: Medicare Other | Admitting: Cardiology

## 2019-06-19 ENCOUNTER — Encounter: Payer: Self-pay | Admitting: Cardiology

## 2019-06-19 ENCOUNTER — Other Ambulatory Visit: Payer: Self-pay

## 2019-06-19 VITALS — BP 132/69 | HR 63 | Temp 98.2°F | Ht 59.0 in | Wt 104.0 lb

## 2019-06-19 DIAGNOSIS — R600 Localized edema: Secondary | ICD-10-CM

## 2019-06-19 DIAGNOSIS — I10 Essential (primary) hypertension: Secondary | ICD-10-CM

## 2019-06-19 DIAGNOSIS — N183 Chronic kidney disease, stage 3 unspecified: Secondary | ICD-10-CM

## 2019-06-19 DIAGNOSIS — I5032 Chronic diastolic (congestive) heart failure: Secondary | ICD-10-CM

## 2019-06-19 NOTE — Progress Notes (Signed)
Primary Physician:  Deland Pretty, MD   Patient ID: Jade [name], female    DOB: 06/10/1927, 86 y.o.   MRN: [id number]  Subjective:    Chief Complaint  Patient presents with  . Hypertension  . Congestive Heart Failure  . Follow-up    5 month    HPI: Jade [name]  is a 86 y.o. female  with diastolic CHF, three-vessel coronary artery calcification on CT scan, type 2 diabetes, hypertension, hypercholesterolemia, hypothyroidism, and bell's palsy on the right side of her face many years ago. She underwent lexiscan nuclear stress test in Jan 2020 that was considered low risk study. Echocardiogram also at that time showed grade 1 diastolic dysfunction and mild pulmonary hypertension. Conservative measures were recommended in view of lack of symptoms. Last seen 6 months ago, had improvement in symptoms with Aldactone.   She did recently fall and injure her hip after moving to quickly and tripping over her feet. No syncope or dizziness. Has shortness of breath if she overexerts herself, states is worse in the winter as she is not as active. No PND or orthopnea. She reports having labs performed with PCP recently that she states are stable. Has CKD stage 3.   Patient is fairly active, she continues to live independently. Does her own yard work and household chores.    Past Medical History:  Diagnosis Date  . Allergic rhinitis   . Bronchitis   . Cataract   . CKD (chronic kidney disease)    stage 3 kidney failure per pt  . Diabetes mellitus   . Diverticulosis 11/2010  . GERD (gastroesophageal reflux disease)   . Glaucoma   . Glaucoma   . Hyperlipidemia   . Hypertension   . Hypothyroid   . Internal hemorrhoids   . Tubular adenoma polyp of rectum 02/2002  . Type 2 diabetes mellitus ([place])     Past Surgical History:  Procedure Laterality Date  . APPENDECTOMY    . BREAST CYST EXCISION    . CHOLECYSTECTOMY    . COLONOSCOPY    . DIVERTICULITIS  2004   SURGERY FOR  DIVERTICULITIS PER PT.  Marland Kitchen HERNIA REPAIR     w/ mesh  . POLYPECTOMY    . TONSILLECTOMY    . TOTAL ABDOMINAL HYSTERECTOMY W/ BILATERAL SALPINGOOPHORECTOMY      Social History   Socioeconomic History  . Marital status: Widowed    Spouse name: Not on file  . Number of children: 2  . Years of education: Not on file  . Highest education level: Not on file  Occupational History  . Not on file  Tobacco Use  . Smoking status: Never Smoker  . Smokeless tobacco: Never Used  Substance and Sexual Activity  . Alcohol use: No  . Drug use: No  . Sexual activity: Not on file  Other Topics Concern  . Not on file  Social History Narrative  . Not on file   Social Determinants of Health   Financial Resource Strain:   . Difficulty of Paying Living Expenses: Not on file  Food Insecurity:   . Worried About Charity fundraiser in the Last Year: Not on file  . Ran Out of Food in the Last Year: Not on file  Transportation Needs:   . Lack of Transportation (Medical): Not on file  . Lack of Transportation (Non-Medical): Not on file  Physical Activity:   . Days of Exercise per Week: Not on file  . Minutes of Exercise  per Session: Not on file  Stress:   . Feeling of Stress : Not on file  Social Connections:   . Frequency of Communication with Friends and Family: Not on file  . Frequency of Social Gatherings with Friends and Family: Not on file  . Attends Religious Services: Not on file  . Active Member of Clubs or Organizations: Not on file  . Attends Archivist Meetings: Not on file  . Marital Status: Not on file  Intimate Partner Violence:   . Fear of Current or Ex-Partner: Not on file  . Emotionally Abused: Not on file  . Physically Abused: Not on file  . Sexually Abused: Not on file    Review of Systems  Constitution: Negative for decreased appetite, malaise/fatigue, weight gain and weight loss.  Eyes: Negative for visual disturbance.  Cardiovascular: Positive for leg  swelling and palpitations (rare). Negative for chest pain, claudication, dyspnea on exertion, orthopnea and syncope.  Respiratory: Negative for hemoptysis and wheezing.   Endocrine: Negative for cold intolerance and heat intolerance.  Hematologic/Lymphatic: Does not bruise/bleed easily.  Skin: Negative for nail changes.  Musculoskeletal: Negative for muscle weakness and myalgias.  Gastrointestinal: Negative for abdominal pain, change in bowel habit, nausea and vomiting.  Neurological: Positive for dizziness (occasional with sudden position changes). Negative for difficulty with concentration, focal weakness and headaches.  Psychiatric/Behavioral: Negative for altered mental status and suicidal ideas.  All other systems reviewed and are negative.     Objective:  Blood pressure 132/69, pulse 63, temperature 98.2 F (36.8 C), height '4\' 11"'$  (1.499 m), weight 104 lb (47.2 kg), SpO2 98 %. Body mass index is 21.01 kg/m.    Physical Exam  Constitutional: She is oriented to person, place, and time. Vital signs are normal. She appears well-developed and well-nourished.  HENT:  Head: Normocephalic and atraumatic.  Cardiovascular: Normal rate, regular rhythm, normal heart sounds and intact distal pulses.  Trace edema  Pulmonary/Chest: Effort normal. No accessory muscle usage. No respiratory distress.  Crepitus noted in bilateral bases; otherwise, clear lung sounds  Abdominal: Soft. Bowel sounds are normal.  Musculoskeletal:        General: Normal range of motion.     Cervical back: Normal range of motion.  Neurological: She is alert and oriented to person, place, and time.  Skin: Skin is warm and dry.  Vitals reviewed.  Radiology: No results found.  Laboratory examination:   External labs:  HDL 57.000 05/15/2019 LDL 08/09/2018 Cholesterol, total 132.000 05/15/2019 Triglycerides 52.000 05/15/2019 A1C 5.800 % 05/15/2019 Glucose Random 109.000 M 05/15/2019 MicroAlbumin Urine 3.500 UG/  03/16/2016 MicroAlbumin/Creat 05/15/2019 BUN 39.000 05/18/2019 Creatinine, Serum 1.130 MG/ 05/15/2019 TSH 1.030 08/09/2018  09/28/2018: Creatinine 1.1, EGFR 51/62, potassium 3.5, BMP otherwise normal.  CMP Latest Ref Rng & Units 01/22/2019 04/27/2018 04/26/2018  Glucose 65 - 99 mg/dL 96 99 88  BUN 8 - 27 mg/dL 36(H) 22 29(H)  Creatinine 0.57 - 1.00 mg/dL 1.18(H) 1.17(H) 1.26(H)  Sodium 134 - 144 mmol/L 136 135 133(L)  Potassium 3.5 - 5.2 mmol/L 4.9 4.0 3.8  Chloride 96 - 106 mmol/L 102 102 100  CO2 20 - 29 mmol/L '23 25 23  '$ Calcium 8.7 - 10.3 mg/dL 10.1 9.2 9.3  Total Protein 6.5 - 8.1 g/dL - - -  Total Bilirubin 0.3 - 1.2 mg/dL - - -  Alkaline Phos 38 - 126 U/L - - -  AST 15 - 41 U/L - - -  ALT 0 - 44 U/L - - -  CBC Latest Ref Rng & Units 04/27/2018 04/25/2018 04/24/2018  WBC 4.0 - 10.5 K/uL 6.2 10.2 17.0(H)  Hemoglobin 12.0 - 15.0 g/dL 9.9(L) 9.8(L) 10.7(L)  Hematocrit 36.0 - 46.0 % 28.8(L) 29.7(L) 32.2(L)  Platelets 150 - 400 K/uL 275 215 225   Lipid Panel  No results found for: CHOL, TRIG, HDL, CHOLHDL, VLDL, LDLCALC, LDLDIRECT HEMOGLOBIN A1C No results found for: HGBA1C, MPG TSH No results for input(s): TSH in the last 8760 hours.  PRN Meds:. Medications Discontinued During This Encounter  Medication Reason  . Cholecalciferol (VITAMIN D-1000 MAX ST) 25 MCG (1000 UT) tablet Error  . hydrochlorothiazide (HYDRODIURIL) 12.5 MG tablet Error  . Melatonin 5 MG CHEW Error  . Melatonin 2.5 MG CAPS Error  . metFORMIN (GLUCOPHAGE) 500 MG tablet Error  . amLODipine (NORVASC) 2.5 MG tablet Error  . atorvastatin (LIPITOR) 10 MG tablet Error  . methylcellulose oral powder Error  . ciprofloxacin (CILOXAN) 0.3 % ophthalmic solution Error   Current Meds  Medication Sig  . acetaminophen (TYLENOL) 500 MG tablet Take by mouth.  Marland Kitchen amLODipine (NORVASC) 5 MG tablet Take 0.5 mg by mouth daily.   Marland Kitchen aspirin 81 MG tablet Take 81 mg by mouth daily.    Marland Kitchen atorvastatin (LIPITOR) 10 MG tablet Take 5 mg  by mouth daily at 6 PM.   . Cholecalciferol (VITAMIN D) 1000 UNITS capsule Take 1,000 Units by mouth daily.    . Coenzyme Q10 (COQ10) 50 MG CAPS Take 1 capsule by mouth daily.    . fish oil-omega-3 fatty acids 1000 MG capsule Take 1 capsule by mouth daily.    Marland Kitchen FREESTYLE LITE test strip TEST QAM  . glucose blood ([employer] TEST STRIPS) test strip Check blood sugar once daily  . hydrochlorothiazide (MICROZIDE) 12.5 MG capsule Take 1 capsule by mouth daily. 1/2  . lactobacillus acidophilus (BACID) TABS tablet Take 1 tablet by mouth daily. Probiotic   . levothyroxine (SYNTHROID, LEVOTHROID) 75 MCG tablet Take 75 mcg by mouth daily.    . metFORMIN (GLUCOPHAGE) 500 MG tablet Take 1 tablet (500 mg total) by mouth daily with breakfast.  . methylcellulose (CITRUCEL) oral powder Mix 1 tablespoonful in 8 ounces of water and drink once daily.  . mupirocin ointment (BACTROBAN) 2 % as needed. nostrils  . olmesartan (BENICAR) 40 MG tablet Take 1 tablet by mouth daily.  Marland Kitchen spironolactone (ALDACTONE) 25 MG tablet Take 1 tablet (25 mg total) by mouth daily.  . timolol (TIMOPTIC) 0.5 % ophthalmic solution Place 1 drop into both eyes 2 (two) times daily.     Cardiac Studies:   Echocardiogram 05/23/2018: Left ventricle cavity is normal in size. Mild concentric hypertrophy of the left ventricle. Normal global wall motion. Doppler evidence of grade I (impaired) diastolic dysfunction, elevated LAP. Calculated EF 67%. Left atrial cavity is mild to moderately dilated, LA volume of 5.0 cm. Mild (Grade I) mitral regurgitation. Mild tricuspid regurgitation. Mild pulmonary hypertension. PAS pressure estimated at 31 mmHg, CVP 3 mmHg. Compared to the study done in 07/23/2010, left atrial size was previously normal, mild pulmonary hypertension is new.  Lexiscan myoview stress test 05/22/2018: 1. Lexiscan stress test was performed. Exercise capacity was not assessed. Stress symptoms included headache. Resting blood  pressure was 160/78 mmHg and peak effect blood pressure was 128/56 mmHg. The resting and stress electrocardiogram demonstrated normal sinus rhythm, normal resting conduction, possible old anteroseptal infarct, no resting arrhythmias and normal rest repolarization. Stress EKG is non diagnostic for ischemia as it is a pharmacologic  stress. 2. The overall quality of the study is good. There is no evidence of abnormal lung activity. LV cavity is small. Stress SPECT images demonstrate homogeneous tracer distribution throughout the myocardium. Small inferior apical perfusion defect seen on rest images only likely represent tissue attenuation artifact. Gated SPECT imaging reveals normal myocardial thickening and wall motion. The left ventricular ejection fraction was normal (75%).  3. Low risk study.  Assessment:   Chronic diastolic (congestive) heart failure (HCC) - Plan: EKG 12-Lead  Essential hypertension  Bilateral leg edema  Stage 3 chronic kidney disease, unspecified whether stage 3a or 3b CKD  EKG 06/19/2019: Normal sinus rhythm with first degree AV block at 61 bpm, normal axis, PRWP cannot exclude anterior infarct old. No evidence of ischemia.  Recommendations:   Jade [name]  is a 86 y.o. female  with diastolic CHF, three-vessel coronary artery calcification on CT scan, type 2 diabetes, hypertension, hypercholesterolemia, hypothyroidism, and bell's palsy on the right side of her face many years ago. She underwent lexiscan nuclear stress test in Jan 2020 that was considered low risk study. Echocardiogram also at that time showed grade 1 diastolic dysfunction and mild pulmonary hypertension. Conservative measures were recommended in view of lack of symptoms. Last seen 6 months ago, had improvement in symptoms with Aldactone.   Patient is here on a 56-monthoffice visit and follow-up for chronic diastolic heart failure.  She is doing well without any significant complaints.  Has minimal  dyspnea on exertion that has been stable.  Despite her advanced age, she remains fairly active.  She monitors her blood pressure regularly at home and has been well controlled.  We will continue with her present medications.  She does have history of bilateral leg edema that is stable with Aldactone and hydrochlorothiazide.  No changes are noted to physical exam or EKG.  Labs that were recently performed with her PCP appear to be stable.  Overall she is doing well from a cardiac standpoint.  I will see her back in 6 months, but encouraged to contact me sooner if needed.    AMiquel Dunn MSN, APRN, FNP-C POt[facility] CenterCardiovascular. PWiederkehr VillageOffice: [phone]: [phone]

## 2019-06-25 ENCOUNTER — Other Ambulatory Visit: Payer: Medicare Other | Admitting: Orthotics

## 2019-07-03 DIAGNOSIS — H353132 Nonexudative age-related macular degeneration, bilateral, intermediate dry stage: Secondary | ICD-10-CM | POA: Insufficient documentation

## 2019-07-09 ENCOUNTER — Ambulatory Visit: Payer: Medicare Other | Admitting: Gastroenterology

## 2019-07-09 ENCOUNTER — Encounter: Payer: Self-pay | Admitting: Gastroenterology

## 2019-07-09 VITALS — BP 120/70 | HR 66 | Temp 98.6°F | Ht 59.0 in | Wt 102.0 lb

## 2019-07-09 DIAGNOSIS — R1013 Epigastric pain: Secondary | ICD-10-CM | POA: Diagnosis not present

## 2019-07-09 DIAGNOSIS — R110 Nausea: Secondary | ICD-10-CM

## 2019-07-09 DIAGNOSIS — R6889 Other general symptoms and signs: Secondary | ICD-10-CM | POA: Diagnosis not present

## 2019-07-09 DIAGNOSIS — R0989 Other specified symptoms and signs involving the circulatory and respiratory systems: Secondary | ICD-10-CM

## 2019-07-09 MED ORDER — PANTOPRAZOLE SODIUM 40 MG PO TBEC: 40.0000 mg | DELAYED_RELEASE_TABLET | Freq: Every day | ORAL | 5 refills | Status: DC

## 2019-07-09 NOTE — Patient Instructions (Signed)
We have sent the following medications to your pharmacy for you to pick up at your convenience: pantoprazole.   You have been scheduled for an endoscopy. Please follow written instructions given to you at your visit today. If you use inhalers (even only as needed), please bring them with you on the day of your procedure.  Thank you for choosing me and [facility].  Pricilla Riffle. Dagoberto Ligas., MD., Marval Regal

## 2019-07-09 NOTE — Progress Notes (Signed)
History of Present Illness: This is an 86 year old female referred by Deland Pretty, MD for the evaluation of epigastric pain, nausea, throat clearing.  She relates symptoms have been present for a few months and are most bothersome in the mornings.  She states that drinking warm water helps with her throat clearing and then having breakfast generally relieves her nausea and epigastric pain.  She notes nausea and epigastric pain frequently return in between meals and are relieved with food.  In early February Dr. Shelia Media recommended that she start pantoprazole 40 mg daily however it does not appear she started this medication.  EGD performed in November 2003 showed mild chronic gastritis. Helicobacter pylori staining was negative.  Recent blood work from Dr. Pennie Banter office showed a mildly elevated calcium at 10.6 with a normal ionized calcium.  Sodium 133.  Mildly elevated BUN and creatinine. Mildly elevated eosinophil count. UA findings consistent with a UTI which was treated. Denies weight loss, constipation, diarrhea, change in stool caliber, melena, hematochezia, vomiting, dysphagia, chest pain.     Allergies  Allergen Reactions  . Codeine   . Miralax [Polyethylene Glycol] Swelling    Feet swelling  . Pentazocine Nausea And Vomiting    Patient experiences Flashing lights  . Pentazocine Lactate   . Tramadol Hcl    Outpatient Medications Prior to Visit  Medication Sig Dispense Refill  . acetaminophen (TYLENOL) 500 MG tablet Take by mouth.    Marland Kitchen amLODipine (NORVASC) 5 MG tablet Take 0.5 mg by mouth daily.     Marland Kitchen aspirin 81 MG tablet Take 81 mg by mouth daily.      Marland Kitchen atorvastatin (LIPITOR) 10 MG tablet Take 5 mg by mouth daily at 6 PM.     . Cholecalciferol (VITAMIN D) 1000 UNITS capsule Take 1,000 Units by mouth daily.      . Coenzyme Q10 (COQ10) 50 MG CAPS Take 1 capsule by mouth daily.      . fish oil-omega-3 fatty acids 1000 MG capsule Take 1 capsule by mouth daily.      Marland Kitchen FREESTYLE  LITE test strip TEST QAM  11  . glucose blood ([employer] TEST STRIPS) test strip Check blood sugar once daily    . hydrochlorothiazide (MICROZIDE) 12.5 MG capsule Take 1 capsule by mouth daily. 1/2    . lactobacillus acidophilus (BACID) TABS tablet Take 1 tablet by mouth daily. Probiotic     . levothyroxine (SYNTHROID, LEVOTHROID) 75 MCG tablet Take 75 mcg by mouth daily.      . metFORMIN (GLUCOPHAGE) 500 MG tablet Take 1 tablet (500 mg total) by mouth daily with breakfast.    . methylcellulose (CITRUCEL) oral powder Mix 1 tablespoonful in 8 ounces of water and drink once daily.    . mupirocin ointment (BACTROBAN) 2 % as needed. nostrils    . olmesartan (BENICAR) 40 MG tablet Take 1 tablet by mouth daily.    Marland Kitchen spironolactone (ALDACTONE) 25 MG tablet Take 1 tablet (25 mg total) by mouth daily. 90 tablet 3  . timolol (TIMOPTIC) 0.5 % ophthalmic solution Place 1 drop into both eyes 2 (two) times daily.   11   No facility-administered medications prior to visit.   Past Medical History:  Diagnosis Date  . Allergic rhinitis   . Bronchitis   . Cataract   . CKD (chronic kidney disease)    stage 3 kidney failure per pt  . Diabetes mellitus   . Diverticulosis 11/2010  . GERD (gastroesophageal reflux disease)   .  Glaucoma   . Glaucoma   . Hyperlipidemia   . Hypertension   . Hypothyroid   . Internal hemorrhoids   . Tubular adenoma polyp of rectum 02/2002  . Type 2 diabetes mellitus ([place])    Past Surgical History:  Procedure Laterality Date  . APPENDECTOMY    . BREAST CYST EXCISION    . CHOLECYSTECTOMY    . COLONOSCOPY    . DIVERTICULITIS  2004   SURGERY FOR DIVERTICULITIS PER PT.  Marland Kitchen HERNIA REPAIR     w/ mesh  . POLYPECTOMY    . TONSILLECTOMY    . TOTAL ABDOMINAL HYSTERECTOMY W/ BILATERAL SALPINGOOPHORECTOMY     Social History   Socioeconomic History  . Marital status: Widowed    Spouse name: Not on file  . Number of children: 2  . Years of education: Not on file  . Highest  education level: Not on file  Occupational History  . Not on file  Tobacco Use  . Smoking status: Never Smoker  . Smokeless tobacco: Never Used  Substance and Sexual Activity  . Alcohol use: No  . Drug use: No  . Sexual activity: Not on file  Other Topics Concern  . Not on file  Social History Narrative  . Not on file   Social Determinants of Health   Financial Resource Strain:   . Difficulty of Paying Living Expenses:   Food Insecurity:   . Worried About Charity fundraiser in the Last Year:   . Arboriculturist in the Last Year:   Transportation Needs:   . Film/video editor (Medical):   Marland Kitchen Lack of Transportation (Non-Medical):   Physical Activity:   . Days of Exercise per Week:   . Minutes of Exercise per Session:   Stress:   . Feeling of Stress :   Social Connections:   . Frequency of Communication with Friends and Family:   . Frequency of Social Gatherings with Friends and Family:   . Attends Religious Services:   . Active Member of Clubs or Organizations:   . Attends Archivist Meetings:   Marland Kitchen Marital Status:    Family History  Problem Relation Age of Onset  . Lung cancer Father   . Heart disease Mother   . Heart disease Brother   . Diabetes Maternal Aunt   . Diabetes Maternal Uncle   . Diabetes Brother   . Colon cancer Neg Hx       Review of Systems: Pertinent positive and negative review of systems were noted in the above HPI section. All other review of systems were otherwise negative.   Physical Exam: General: Well developed, well nourished, thin, elderly, no acute distress Head: Normocephalic and atraumatic Eyes:  sclerae anicteric, EOMI Ears: Normal auditory acuity Mouth: Not examined, mask on during Covid-19 pandemic Neck: Supple, no masses or thyromegaly Lungs: Clear throughout to auscultation Heart: Regular rate and rhythm; no murmurs, rubs or bruits Abdomen: Soft, non tender and non distended. No masses, hepatosplenomegaly or  hernias noted. Normal Bowel sounds Rectal: Not done  Musculoskeletal: Symmetrical with no gross deformities  Skin: No lesions on visible extremities Pulses:  Normal pulses noted Extremities: No clubbing, cyanosis, edema or deformities noted Neurological: Alert oriented x 4, grossly nonfocal Cervical Nodes:  No significant cervical adenopathy Inguinal Nodes: No significant inguinal adenopathy Psychological:  Alert and cooperative. Normal mood and affect   Assessment and Recommendations:  1. Epigastric pain, nausea, throat clearing. R/O ulcer, gastritis, neoplasm, GERD.  Begin pantoprazole 40 mg daily.  Schedule EGD. The risks (including bleeding, perforation, infection, missed lesions, medication reactions and possible hospitalization or surgery if complications occur), benefits, and alternatives to endoscopy with possible biopsy and possible dilation were discussed with the patient and they consent to proceed.   2.  Personal history of adenomatous colon polyps.  Last colonoscopy performed in September 2012 with one 7 mm tubular adenoma removed.  She is no longer in a polyp surveillance program due to age.  3.  Left colon diverticulosis with a history of diverticulitis.  Adequate daily water intake and continue daily Citrucel.   cc: Deland Pretty, MD [address] [place],  [place] [address]

## 2019-07-12 ENCOUNTER — Telehealth: Payer: Self-pay | Admitting: Gastroenterology

## 2019-07-13 NOTE — Telephone Encounter (Signed)
Patient is not scheduled for EGD until 4/20.  She is encouraged to see her PCP if she has concerns about her cough. She also states she was to have a follow up CXR for a lung lesion from one year ago.  .  She is encouraged to contact her PCP

## 2019-08-07 ENCOUNTER — Other Ambulatory Visit: Payer: Self-pay

## 2019-08-07 ENCOUNTER — Ambulatory Visit: Payer: Medicare Other | Admitting: Orthotics

## 2019-08-07 DIAGNOSIS — E1149 Type 2 diabetes mellitus with other diabetic neurological complication: Secondary | ICD-10-CM

## 2019-08-07 DIAGNOSIS — M205X9 Other deformities of toe(s) (acquired), unspecified foot: Secondary | ICD-10-CM

## 2019-08-07 DIAGNOSIS — M21619 Bunion of unspecified foot: Secondary | ICD-10-CM

## 2019-08-07 NOTE — Progress Notes (Signed)
Patient presents today for diabetic shoe measurement and foam casting.  Goals of diabetic shoes/inserts to offer protection from conditions secondary to DM2, offer relief from sheer forces that could lead to ulcerations, protect the foot, and offer greater stability. Patient is under supervision of DPM Physician managing patients DM2: Patient has following documented conditions to qualify for diabetic shoes/inserts: Patient measured with brannock device: 

## 2019-08-14 ENCOUNTER — Other Ambulatory Visit: Payer: Self-pay

## 2019-08-14 ENCOUNTER — Encounter: Payer: Self-pay | Admitting: Gastroenterology

## 2019-08-14 ENCOUNTER — Ambulatory Visit (AMBULATORY_SURGERY_CENTER): Payer: Medicare Other | Admitting: Gastroenterology

## 2019-08-14 VITALS — BP 142/72 | HR 65 | Temp 96.2°F | Resp 19 | Ht 59.0 in | Wt 102.0 lb

## 2019-08-14 DIAGNOSIS — K449 Diaphragmatic hernia without obstruction or gangrene: Secondary | ICD-10-CM

## 2019-08-14 DIAGNOSIS — K259 Gastric ulcer, unspecified as acute or chronic, without hemorrhage or perforation: Secondary | ICD-10-CM

## 2019-08-14 DIAGNOSIS — K2970 Gastritis, unspecified, without bleeding: Secondary | ICD-10-CM | POA: Diagnosis not present

## 2019-08-14 DIAGNOSIS — R1013 Epigastric pain: Secondary | ICD-10-CM

## 2019-08-14 MED ORDER — SODIUM CHLORIDE 0.9 % IV SOLN: 500.0000 mL | Freq: Once | INTRAVENOUS | Status: DC

## 2019-08-14 NOTE — Progress Notes (Signed)
Called to room to assist during endoscopic procedure.  Patient ID and intended procedure confirmed with present staff. Received instructions for my participation in the procedure from the performing physician.  

## 2019-08-14 NOTE — Op Note (Signed)
[facility] Patient Name: Jade [name] Procedure Date: 08/14/2019 9:41 AM MRN: [id number] Endoscopist: Ladene Artist , MD Age: 86 Referring MD:  Date of Birth: 1927/06/07 Gender: Female Account #: [id number] Procedure:                Upper GI endoscopy Indications:              Epigastric abdominal pain, Suspected                            gastroesophageal reflux disease Medicines:                Monitored Anesthesia Care Procedure:                Pre-Anesthesia Assessment:                           - Prior to the procedure, a History and Physical                            was performed, and patient medications and                            allergies were reviewed. The patient's tolerance of                            previous anesthesia was also reviewed. The risks                            and benefits of the procedure and the sedation                            options and risks were discussed with the patient.                            All questions were answered, and informed consent                            was obtained. Prior Anticoagulants: The patient has                            taken no previous anticoagulant or antiplatelet                            agents. ASA Grade Assessment: II - A patient with                            mild systemic disease. After reviewing the risks                            and benefits, the patient was deemed in                            satisfactory condition to undergo the procedure.  After obtaining informed consent, the endoscope was                            passed under direct vision. Throughout the                            procedure, the patient's blood pressure, pulse, and                            oxygen saturations were monitored continuously. The                            Endoscope was introduced through the mouth, and                            advanced to the second part of  duodenum. The upper                            GI endoscopy was accomplished without difficulty.                            The patient tolerated the procedure well. Scope In: Scope Out: Findings:                 The examined esophagus was normal.                           Localized mild inflammation characterized by                            erythema and granularity was found on the greater                            curvature of the stomach. Biopsies were taken with                            a cold forceps for histology.                           A small hiatal hernia was present.                           The exam of the stomach was otherwise normal.                           The duodenal bulb and second portion of the                            duodenum were normal. Complications:            No immediate complications. Estimated Blood Loss:     Estimated blood loss was minimal. Impression:               - Normal esophagus.                           -  Mild gastritis. Biopsied.                           - Small hiatal hernia.                           - Normal duodenal bulb and second portion of the                            duodenum. Recommendation:           - Patient has a contact number available for                            emergencies. The signs and symptoms of potential                            delayed complications were discussed with the                            patient. Return to normal activities tomorrow.                            Written discharge instructions were provided to the                            patient.                           - Resume previous diet.                           - Follow antireflux measure long term.                           - Continue present medications including                            pantoprazole 40 mg qam (not prn).                           - Await pathology results.                           - Follow up with  PCP. Ladene Artist, MD 08/14/2019 10:12:19 AM This report has been signed electronically.

## 2019-08-14 NOTE — Patient Instructions (Signed)
YOU HAD AN ENDOSCOPIC PROCEDURE TODAY AT [facility]:   Refer to the procedure report that was given to you for any specific questions about what was found during the examination.  If the procedure report does not answer your questions, please call your gastroenterologist to clarify.  If you requested that your care partner not be given the details of your procedure findings, then the procedure report has been included in a sealed envelope for you to review at your convenience later.  YOU SHOULD EXPECT: Some feelings of bloating in the abdomen. Passage of more gas than usual.  Walking can help get rid of the air that was put into your GI tract during the procedure and reduce the bloating. If you had a lower endoscopy (such as a colonoscopy or flexible sigmoidoscopy) you may notice spotting of blood in your stool or on the toilet paper. If you underwent a bowel prep for your procedure, you may not have a normal bowel movement for a few days.  Please Note:  You might notice some irritation and congestion in your nose or some drainage.  This is from the oxygen used during your procedure.  There is no need for concern and it should clear up in a day or so.  SYMPTOMS TO REPORT IMMEDIATELY:   Following upper endoscopy (EGD)  Vomiting of blood or coffee ground material  New chest pain or pain under the shoulder blades  Painful or persistently difficult swallowing  New shortness of breath  Fever of 100F or higher  Black, tarry-looking stools  For urgent or emergent issues, a gastroenterologist can be reached at any hour by calling [phone]. Do not use MyChart messaging for urgent concerns.    DIET:  We do recommend a small meal at first, but then you may proceed to your regular diet.  Drink plenty of fluids but you should avoid alcoholic beverages for 24 hours.  MEDICATIONS: Continue present medications including Pantoprazole 40 mg by mouth every morning (not as  needed).  Follow an Anti-reflux regimen long term.  Follow up with your primary doctor.  ACTIVITY:  You should plan to take it easy for the rest of today and you should NOT DRIVE or use heavy machinery until tomorrow (because of the sedation medicines used during the test).    FOLLOW UP: Our staff will call the number listed on your records 48-72 hours following your procedure to check on you and address any questions or concerns that you may have regarding the information given to you following your procedure. If we do not reach you, we will leave a message.  We will attempt to reach you two times.  During this call, we will ask if you have developed any symptoms of COVID 19. If you develop any symptoms (ie: fever, flu-like symptoms, shortness of breath, cough etc.) before then, please call [phone].  If you test positive for Covid 19 in the 2 weeks post procedure, please call and report this information to [place].    If any biopsies were taken you will be contacted by phone or by letter within the next 1-3 weeks.  Please call us at [phone] if you have not heard about the biopsies in 3 weeks.   Thank you for allowing [place] to provide for your healthcare needs today.   SIGNATURES/CONFIDENTIALITY: You and/or your care partner have signed paperwork which will be entered into your electronic medical record.  These signatures attest to the fact that that the  information above on your After Visit Summary has been reviewed and is understood.  Full responsibility of the confidentiality of this discharge information lies with you and/or your care-partner.

## 2019-08-14 NOTE — Progress Notes (Signed)
PT taken to PACU. Monitors in place. VSS. Report given to RN. 

## 2019-08-14 NOTE — Progress Notes (Signed)
Temp taken by JB VS taken by CW 

## 2019-08-16 ENCOUNTER — Telehealth: Payer: Self-pay | Admitting: *Deleted

## 2019-08-16 ENCOUNTER — Telehealth: Payer: Self-pay

## 2019-08-16 NOTE — Telephone Encounter (Signed)
1st follow up call made.  NALM 

## 2019-08-16 NOTE — Telephone Encounter (Signed)
  Follow up Call-  Call back number 08/14/2019  Post procedure Call Back phone  # [phone]  Permission to leave phone message Yes  Some recent data might be hidden     Patient questions:  Busy signal.  Second call.

## 2019-08-20 ENCOUNTER — Encounter: Payer: Self-pay | Admitting: Podiatry

## 2019-08-20 ENCOUNTER — Other Ambulatory Visit: Payer: Self-pay

## 2019-08-20 ENCOUNTER — Ambulatory Visit: Payer: Medicare Other | Admitting: Podiatry

## 2019-08-20 DIAGNOSIS — B351 Tinea unguium: Secondary | ICD-10-CM

## 2019-08-20 DIAGNOSIS — L853 Xerosis cutis: Secondary | ICD-10-CM

## 2019-08-20 DIAGNOSIS — M79675 Pain in left toe(s): Secondary | ICD-10-CM | POA: Diagnosis not present

## 2019-08-20 DIAGNOSIS — M79674 Pain in right toe(s): Secondary | ICD-10-CM | POA: Diagnosis not present

## 2019-08-20 DIAGNOSIS — E1149 Type 2 diabetes mellitus with other diabetic neurological complication: Secondary | ICD-10-CM | POA: Diagnosis not present

## 2019-08-20 NOTE — Progress Notes (Signed)
  Subjective:  Patient ID: Jade [name], female    DOB: 1927/10/25,  MRN: [id number]  Chief Complaint  Patient presents with  . Nail Problem    pt is here for routine foot care   86 y.o. female returns for the above complaint.  Patient presents with painful thickened elongated toenails x10.  Patient states that they are getting long and is causing her pain when ambulating.  She denies any other acute complaints.  She states that she also needs a new refill on diabetic shoes as she has worn out the previous one and it has been past 1 year.  She denies any other acute complaints.  Objective:  There were no vitals filed for this visit. Podiatric Exam: Vascular: dorsalis pedis and posterior tibial pulses are palpable bilateral. Capillary return is immediate. Temperature gradient is WNL. Skin turgor WNL  Sensorium: Normal Semmes Weinstein monofilament test. Normal tactile sensation bilaterally. Nail Exam: Pt has thick disfigured discolored nails with subungual debris noted bilateral entire nail hallux through fifth toenails Ulcer Exam: There is no evidence of ulcer or pre-ulcerative changes or infection. Orthopedic Exam: Muscle tone and strength are WNL. No limitations in general ROM. No crepitus or effusions noted. HAV  B/L.  Hammer toes 2-5  B/L. Skin: No Porokeratosis. No infection or ulcers.  Xerosis bilateral lower extremity  Assessment & Plan:  Patient was evaluated and treated and all questions answered.  Onychomycosis with pain  -Nails palliatively debrided as below. -Educated on self-care  Xerosis bilateral plantar extremity -I explained to the patient the etiology of xerosis and various treatment options were extensively discussed.  I explained to the patient the importance of maintaining moisturization of the skin with application of over-the-counter lotion such as Eucerin or Luciderm.  I have asked the patient to apply this twice a day.  If unable to resolve patient will benefit  from prescription lotion.   Hammertoe contractures bilaterally -I explained to the patient the etiology of hammertoe contractures and various treatment options associated with it.  I believe patient will benefit from diabetic shoes as she has deformities of the foot that are present with underlying diabetes. -She has been casted for them.  Procedure: Nail Debridement Rationale: pain  Type of Debridement: manual, sharp debridement. Instrumentation: Nail nipper, rotary burr. Number of Nails: 10  Procedures and Treatment: Consent by patient was obtained for treatment procedures. The patient understood the discussion of treatment and procedures well. All questions were answered thoroughly reviewed. Debridement of mycotic and hypertrophic toenails, 1 through 5 bilateral and clearing of subungual debris. No ulceration, no infection noted.  Return Visit-Office Procedure: Patient instructed to return to the office for a follow up visit 3 months for continued evaluation and treatment.  Boneta Lucks, DPM    No follow-ups on file.

## 2019-08-21 ENCOUNTER — Encounter: Payer: Self-pay | Admitting: Gastroenterology

## 2019-08-27 ENCOUNTER — Ambulatory Visit (HOSPITAL_COMMUNITY)
Admission: EM | Admit: 2019-08-27 | Discharge: 2019-08-27 | Disposition: A | Discharge status: Home / Self Care | Payer: Medicare Other | Attending: Internal Medicine | Admitting: Internal Medicine

## 2019-08-27 ENCOUNTER — Encounter (HOSPITAL_COMMUNITY): Payer: Self-pay

## 2019-08-27 ENCOUNTER — Other Ambulatory Visit: Payer: Self-pay

## 2019-08-27 DIAGNOSIS — K5792 Diverticulitis of intestine, part unspecified, without perforation or abscess without bleeding: Secondary | ICD-10-CM

## 2019-08-27 DIAGNOSIS — R350 Frequency of micturition: Secondary | ICD-10-CM | POA: Insufficient documentation

## 2019-08-27 LAB — CBC WITH DIFFERENTIAL/PLATELET
Abs Immature Granulocytes: 0.02 10*3/uL (ref 0.00–0.07)
Basophils Absolute: 0.1 10*3/uL (ref 0.0–0.1)
Basophils Relative: 1 %
Eosinophils Absolute: 0.3 10*3/uL (ref 0.0–0.5)
Eosinophils Relative: 4 %
HCT: 39 % (ref 36.0–46.0)
Hemoglobin: 12.9 g/dL (ref 12.0–15.0)
Immature Granulocytes: 0 %
Lymphocytes Relative: 24 %
Lymphs Abs: 1.6 10*3/uL (ref 0.7–4.0)
MCH: 31 pg (ref 26.0–34.0)
MCHC: 33.1 g/dL (ref 30.0–36.0)
MCV: 93.8 fL (ref 80.0–100.0)
Monocytes Absolute: 0.5 10*3/uL (ref 0.1–1.0)
Monocytes Relative: 8 %
Neutro Abs: 4.4 10*3/uL (ref 1.7–7.7)
Neutrophils Relative %: 63 %
Platelets: 257 10*3/uL (ref 150–400)
RBC: 4.16 MIL/uL (ref 3.87–5.11)
RDW: 13 % (ref 11.5–15.5)
WBC: 6.9 10*3/uL (ref 4.0–10.5)
nRBC: 0 % (ref 0.0–0.2)

## 2019-08-27 LAB — POCT URINALYSIS DIP (DEVICE)
Bilirubin Urine: NEGATIVE
Glucose, UA: NEGATIVE mg/dL
Ketones, ur: NEGATIVE mg/dL
Nitrite: NEGATIVE
Protein, ur: NEGATIVE mg/dL
Specific Gravity, Urine: 1.015 (ref 1.005–1.030)
Urobilinogen, UA: 0.2 mg/dL (ref 0.0–1.0)
pH: 5.5 (ref 5.0–8.0)

## 2019-08-27 MED ORDER — METRONIDAZOLE 500 MG PO TABS
500.0000 mg | ORAL_TABLET | Freq: Three times a day (TID) | ORAL | 0 refills | Status: AC
Start: 2019-08-27 — End: 2019-09-01

## 2019-08-27 MED ORDER — CIPROFLOXACIN HCL 500 MG PO TABS
500.0000 mg | ORAL_TABLET | Freq: Two times a day (BID) | ORAL | 0 refills | Status: DC
Start: 2019-08-27 — End: 2019-12-24

## 2019-08-27 NOTE — ED Triage Notes (Signed)
Pt c/o 8/10 pain in left flank since yesterday. Pt c/o urinary frequency. Pt denies any other symptoms.

## 2019-08-28 NOTE — ED Provider Notes (Signed)
[place]    CSN: [id number] Arrival date & time: 08/27/19  1853      History   Chief Complaint Chief Complaint  Patient presents with  . Flank Pain    HPI Jade [name] is a 86 y.o. female comes to urgent care with left lower quadrant abdominal pain of 1 day duration.  Patient symptoms started yesterday.  She has a history of diverticulitis.  No nausea or vomiting.  No abdominal distention.  No fever or chills.  No flank pain.  Patient also complains of some urinary frequency with no dysuria or urgency.   Abdominal pain is currently 8 out of 10, constant, aggravated by palpation, no known relieving factors and no radiation.  HPI  Past Medical History:  Diagnosis Date  . Allergic rhinitis   . Bronchitis   . Cataract   . CKD (chronic kidney disease)    stage 3 kidney failure per pt  . Diabetes mellitus   . Diverticulosis 11/2010  . GERD (gastroesophageal reflux disease)   . Glaucoma   . Glaucoma   . Hyperlipidemia   . Hypertension   . Hypothyroid   . Internal hemorrhoids   . Tubular adenoma polyp of rectum 02/2002  . Type 2 diabetes mellitus [facility])     Patient Active Problem List   Diagnosis Date Noted  . Chronic diastolic (congestive) heart failure ([place]) 06/21/2018  . Coronary artery calcification seen on CT scan 06/21/2018  . Acute diverticulitis 04/26/2018  . Diverticulitis of large intestine with abscess 04/24/2018  . Bilateral pseudophakia 12/02/2014  . Rectal bleeding 07/24/2013  . Primary open angle glaucoma of both eyes, moderate stage 04/30/2013  . Nuclear cataract 11/03/2011  . PCO (posterior capsular opacification) 11/03/2011  . Dermatochalasis 03/26/2011  . Gastroesophageal reflux disease 02/08/2011  . Glaucoma 02/08/2011  . Hyperlipidemia 02/08/2011  . Overactive bladder 02/08/2011  . DYSPNEA ON EXERTION 02/03/2010  . HYPOTHYROIDISM 08/07/2008  . DM2 (diabetes mellitus, type 2) ([place]) 08/07/2008  . Essential hypertension 08/07/2008   . Seasonal and perennial allergic rhinitis 08/09/2007  . BRONCHITIS 08/09/2007    Past Surgical History:  Procedure Laterality Date  . APPENDECTOMY    . BREAST CYST EXCISION    . CHOLECYSTECTOMY    . COLONOSCOPY    . DIVERTICULITIS  2004   SURGERY FOR DIVERTICULITIS PER PT.  Marland Kitchen HERNIA REPAIR     w/ mesh  . POLYPECTOMY    . TONSILLECTOMY    . TOTAL ABDOMINAL HYSTERECTOMY W/ BILATERAL SALPINGOOPHORECTOMY      OB History   No obstetric history on file.      Home Medications    Prior to Admission medications   Medication Sig Start Date End Date Taking? Authorizing Provider  acetaminophen (TYLENOL) 500 MG tablet Take by mouth.    [provider]  amLODipine (NORVASC) 2.5 MG tablet Take 2.5 mg by mouth daily. 07/24/19   [provider]  aspirin 81 MG tablet Take 81 mg by mouth daily.      [provider]  atorvastatin (LIPITOR) 10 MG tablet Take 5 mg by mouth daily at 6 PM.  12/15/10   [provider]  Cholecalciferol (VITAMIN D) 1000 UNITS capsule Take 1,000 Units by mouth daily.      [provider]  ciprofloxacin (CIPRO) 500 MG tablet Take 1 tablet (500 mg total) by mouth every 12 (twelve) hours. 08/27/19   Chase Picket, MD  Coenzyme Q10 (COQ10) 50 MG CAPS Take 1 capsule by  mouth daily.      [provider]  fish oil-omega-3 fatty acids 1000 MG capsule Take 1 capsule by mouth daily.      [provider]  FREESTYLE LITE test strip TEST QAM 09/13/14   [provider]  glucose blood ([employer] TEST STRIPS) test strip Check blood sugar once daily    [provider]  hydrochlorothiazide (MICROZIDE) 12.5 MG capsule Take 1 capsule by mouth daily. 1/2    [provider]  lactobacillus acidophilus (BACID) TABS tablet Take 1 tablet by mouth daily. Probiotic     [provider]  levothyroxine (SYNTHROID, LEVOTHROID) 75 MCG tablet Take 75 mcg by mouth daily.      [provider]    metFORMIN (GLUCOPHAGE) 500 MG tablet Take 1 tablet (500 mg total) by mouth daily with breakfast. 04/28/18   Ghimire, Henreitta Leber, MD  methylcellulose (CITRUCEL) oral powder Mix 1 tablespoonful in 8 ounces of water and drink once daily.    [provider]  metroNIDAZOLE (FLAGYL) 500 MG tablet Take 1 tablet (500 mg total) by mouth 3 (three) times daily for 5 days. 08/27/19 09/01/19  Chase Picket, MD  mupirocin ointment (BACTROBAN) 2 % as needed. nostrils 12/19/18   [provider]  olmesartan (BENICAR) 40 MG tablet Take 1 tablet by mouth daily.    [provider]  pantoprazole (PROTONIX) 40 MG tablet Take 1 tablet (40 mg total) by mouth daily. 07/09/19   Ladene Artist, MD  spironolactone (ALDACTONE) 25 MG tablet Take 1 tablet (25 mg total) by mouth daily. 02/27/19   Miquel Dunn, NP  timolol (TIMOPTIC) 0.5 % ophthalmic solution Place 1 drop into both eyes 2 (two) times daily.  08/26/14   [provider]    Family History Family History  Problem Relation Age of Onset  . Lung cancer Father   . Esophageal cancer Father   . Heart disease Mother   . Heart disease Brother   . Diabetes Maternal Aunt   . Diabetes Maternal Uncle   . Diabetes Brother   . Colon cancer Neg Hx   . Rectal cancer Neg Hx   . Stomach cancer Neg Hx     Social History Social History   Tobacco Use  . Smoking status: Never Smoker  . Smokeless tobacco: Never Used  Substance Use Topics  . Alcohol use: No  . Drug use: No     Allergies   Codeine, Miralax [polyethylene glycol], Pentazocine, Pentazocine lactate, and Tramadol hcl   Review of Systems Review of Systems  Constitutional: Negative.  Negative for chills and fever.  Gastrointestinal: Positive for abdominal pain. Negative for diarrhea, nausea, rectal pain and vomiting.  Genitourinary: Positive for frequency. Negative for dysuria and urgency.  Musculoskeletal: Negative.  Negative for arthralgias.  Neurological:  Negative for dizziness, speech difficulty, light-headedness and headaches.     Physical Exam Triage Vital Signs ED Triage Vitals  Enc Vitals Group     BP 08/27/19 1917 (!) 165/80     Pulse Rate 08/27/19 1917 70     Resp 08/27/19 1917 16     Temp 08/27/19 1917 98.1 F (36.7 C)     Temp Source 08/27/19 1917 Oral     SpO2 08/27/19 1917 99 %     Weight 08/27/19 1920 102 lb (46.3 kg)     Height 08/27/19 1920 4\' 11"  (1.499 m)     Head Circumference --      Peak Flow --  Pain Score 08/27/19 1920 8     Pain Loc --      Pain Edu? --      Excl. in [place]? --    No data found.  Updated Vital Signs BP (!) 165/80   Pulse 70   Temp 98.1 F (36.7 C) (Oral)   Resp 16   Ht 4\' 11"  (1.499 m)   Wt 46.3 kg   SpO2 99%   BMI 20.60 kg/m   Visual Acuity Right Eye Distance:   Left Eye Distance:   Bilateral Distance:    Right Eye Near:   Left Eye Near:    Bilateral Near:     Physical Exam Constitutional:      General: She is in acute distress.     Appearance: She is not ill-appearing.  Cardiovascular:     Pulses: Normal pulses.     Heart sounds: Normal heart sounds.  Pulmonary:     Effort: Pulmonary effort is normal.     Breath sounds: Normal breath sounds.  Abdominal:     General: Bowel sounds are normal. There is no distension.     Palpations: Abdomen is soft.     Tenderness: There is abdominal tenderness. There is no guarding or rebound.     Comments: Left lower quadrant tenderness with no guarding or rebound tenderness.  Musculoskeletal:        General: No tenderness, deformity or signs of injury. Normal range of motion.  Skin:    General: Skin is warm.     Capillary Refill: Capillary refill takes less than 2 seconds.     Findings: No bruising or erythema.  Neurological:     Mental Status: She is alert.      UC Treatments / Results  Labs (all labs ordered are listed, but only abnormal results are displayed) Labs Reviewed  POCT URINALYSIS DIP (DEVICE) - Abnormal;  Notable for the following components:      Result Value   Hgb urine dipstick TRACE (*)    Leukocytes,Ua SMALL (*)    All other components within normal limits  URINE CULTURE  CBC WITH DIFFERENTIAL/PLATELET    EKG   Radiology No results found.  Procedures Procedures (including critical care time)  Medications Ordered in UC Medications - No data to display  Initial Impression / Assessment and Plan / UC Course  I have reviewed the triage vital signs and the nursing notes.  Pertinent labs & imaging results that were available during my care of the patient were reviewed by me and considered in my medical decision making (see chart for details).     1.  Acute diverticulitis: Point-of-care urinalysis is significant for small leukocyte esterase and trace blood Ciprofloxacin 500 mg twice daily x5 days Flagyl 500 mg 3 times daily x5 days Increase oral fluid intake Patient develops worsening abdominal pain, fever, chills, abdominal distention she is advised to return to the urgent care to be reevaluated. Final Clinical Impressions(s) / UC Diagnoses   Final diagnoses:  Acute diverticulitis   Discharge Instructions   None    ED Prescriptions    Medication Sig Dispense Auth. Provider   ciprofloxacin (CIPRO) 500 MG tablet Take 1 tablet (500 mg total) by mouth every 12 (twelve) hours. 10 tablet Dorathea Faerber, Myrene Galas, MD   metroNIDAZOLE (FLAGYL) 500 MG tablet Take 1 tablet (500 mg total) by mouth 3 (three) times daily for 5 days. 15 tablet Markan Cazarez, Myrene Galas, MD     PDMP not reviewed this encounter.  Chase Picket, MD 08/28/19 2131

## 2019-08-29 LAB — URINE CULTURE: Culture: >=100000 — AB

## 2019-08-30 ENCOUNTER — Telehealth (HOSPITAL_COMMUNITY): Payer: Self-pay

## 2019-08-30 DIAGNOSIS — N390 Urinary tract infection, site not specified: Secondary | ICD-10-CM

## 2019-08-30 MED ORDER — CEPHALEXIN 500 MG PO CAPS: 500.0000 mg | ORAL_CAPSULE | Freq: Two times a day (BID) | ORAL | 0 refills | Status: AC

## 2019-08-30 MED ORDER — CEPHALEXIN 500 MG PO CAPS: 500.0000 mg | ORAL_CAPSULE | Freq: Four times a day (QID) | ORAL | 0 refills | Status: DC

## 2019-08-30 NOTE — Telephone Encounter (Signed)
Based on culture results, pt advised to stop Cipro and begin taking Keflex 500mg  BID x 7 days.    (Pharmacy notified to not fill order for Keflex four times daily.)

## 2019-08-30 NOTE — Telephone Encounter (Signed)
Pt called and stated that [facility] filled Rx from today wrongly. Pt states she was given new bottle of Cipro instead of the Keflex that was sent/received by pharmacy digitally at 1340. Instructed pt to call pharmacy and inform them of wrong fill. This RN also called pharmacy to inform them of incorrect medication.

## 2019-09-03 ENCOUNTER — Telehealth: Payer: Self-pay | Admitting: Gastroenterology

## 2019-09-03 ENCOUNTER — Other Ambulatory Visit: Payer: Self-pay

## 2019-09-03 MED ORDER — AMOXICILLIN-POT CLAVULANATE 875-125 MG PO TABS
1.0000 | ORAL_TABLET | Freq: Two times a day (BID) | ORAL | 0 refills | Status: DC
Start: 2019-09-03 — End: 2019-12-24

## 2019-09-03 NOTE — Telephone Encounter (Signed)
Patient called states she has a lot of pain on the left side also mentioned she was taking antibiotics for diverticulitis Metronidazole 500 mg for 5days

## 2019-09-03 NOTE — Telephone Encounter (Signed)
Continue Keflex and Flagyl as prescribed  Full liquids for 1-2 days until pain improves then soft, bland, low residue diet until all symptoms have resolved Dicyclomine 10 mg po tid prn abdominal pain, #30, no refills Call or seek care in ED if symptoms do not improve

## 2019-09-03 NOTE — Telephone Encounter (Signed)
Augmentin 875 mg po bid, #14, no refills and take with food

## 2019-09-03 NOTE — Telephone Encounter (Signed)
Pt states she was seen at urgent care for diverticultis last week and placed on cipro and flagyl for 5 days. States she was taken off of the cipro and switched to keflex. Pt states it was because cipro made her dizzy, chart states switched to keflex due to UTI. Pt states she started to feel better yesterday and this am but now she is starting to have LLQ abd pain again. Pt does not feel she was on antibiotics long enough, requesting more medication. Please advise.

## 2019-09-03 NOTE — Telephone Encounter (Signed)
Pt is out of the antibiotics, she was only given 5 days worth.

## 2019-09-04 ENCOUNTER — Telehealth: Payer: Self-pay | Admitting: Gastroenterology

## 2019-09-04 NOTE — Telephone Encounter (Signed)
Yes ok to use Augmentin with mild renal insufficiency. Please take as prescribed

## 2019-09-04 NOTE — Telephone Encounter (Signed)
Please advise Dr. Fuller Plan if this is ok for patient to start medication.

## 2019-09-04 NOTE — Telephone Encounter (Signed)
Pt aware and script sent to pharmacy. 

## 2019-09-04 NOTE — Telephone Encounter (Signed)
Patient called states she was given Augmentin and she has issues with her kidneys wanted to make sure it was ok for her to continue

## 2019-09-04 NOTE — Telephone Encounter (Signed)
Informed patient to start Augmentin as prescribed. Patient verbalized understanding.

## 2019-09-17 ENCOUNTER — Telehealth: Payer: Self-pay | Admitting: Podiatry

## 2019-09-17 NOTE — Telephone Encounter (Signed)
Pt left message Friday checking status of diabetic shoes.   I returned call and left message for pt to call to schedule an appt to pick them up that they should be here anytime.

## 2019-10-10 ENCOUNTER — Ambulatory Visit: Payer: Medicare Other | Admitting: Orthotics

## 2019-10-10 ENCOUNTER — Other Ambulatory Visit: Payer: Self-pay

## 2019-10-10 DIAGNOSIS — M2042 Other hammer toe(s) (acquired), left foot: Secondary | ICD-10-CM | POA: Diagnosis not present

## 2019-10-10 DIAGNOSIS — E119 Type 2 diabetes mellitus without complications: Secondary | ICD-10-CM | POA: Diagnosis not present

## 2019-10-10 DIAGNOSIS — M2012 Hallux valgus (acquired), left foot: Secondary | ICD-10-CM | POA: Diagnosis not present

## 2019-10-10 DIAGNOSIS — M2041 Other hammer toe(s) (acquired), right foot: Secondary | ICD-10-CM

## 2019-10-10 DIAGNOSIS — M2011 Hallux valgus (acquired), right foot: Secondary | ICD-10-CM | POA: Diagnosis not present

## 2019-10-17 ENCOUNTER — Telehealth: Payer: Self-pay | Admitting: Podiatry

## 2019-10-17 NOTE — Telephone Encounter (Signed)
Pt left message yesterday stating that her shoes are causing red spot on her toes.  I returned call and have scheduled pt to see Liliane Channel on 6.28 to check on the shoes to see what needs to be done.

## 2019-10-22 ENCOUNTER — Ambulatory Visit: Payer: Medicare Other | Admitting: Orthotics

## 2019-10-22 ENCOUNTER — Other Ambulatory Visit: Payer: Self-pay

## 2019-10-22 DIAGNOSIS — M21619 Bunion of unspecified foot: Secondary | ICD-10-CM

## 2019-10-22 DIAGNOSIS — E1149 Type 2 diabetes mellitus with other diabetic neurological complication: Secondary | ICD-10-CM

## 2019-10-22 NOTE — Progress Notes (Signed)
Need to stretch at bunion site

## 2019-11-06 ENCOUNTER — Telehealth: Payer: Self-pay | Admitting: Podiatry

## 2019-11-06 NOTE — Telephone Encounter (Signed)
Pt called 3 times and was asking about the shoes she dropped off to be stretched over a week ago and she has not heard anything.

## 2019-11-21 ENCOUNTER — Ambulatory Visit (INDEPENDENT_AMBULATORY_CARE_PROVIDER_SITE_OTHER): Payer: Medicare Other | Admitting: Podiatry

## 2019-11-21 ENCOUNTER — Other Ambulatory Visit: Payer: Self-pay

## 2019-11-21 DIAGNOSIS — E1149 Type 2 diabetes mellitus with other diabetic neurological complication: Secondary | ICD-10-CM | POA: Diagnosis not present

## 2019-11-21 DIAGNOSIS — M2012 Hallux valgus (acquired), left foot: Secondary | ICD-10-CM | POA: Diagnosis not present

## 2019-11-21 DIAGNOSIS — M79675 Pain in left toe(s): Secondary | ICD-10-CM

## 2019-11-21 DIAGNOSIS — M79674 Pain in right toe(s): Secondary | ICD-10-CM

## 2019-11-21 DIAGNOSIS — M2011 Hallux valgus (acquired), right foot: Secondary | ICD-10-CM

## 2019-11-21 DIAGNOSIS — B351 Tinea unguium: Secondary | ICD-10-CM | POA: Diagnosis not present

## 2019-11-22 ENCOUNTER — Encounter: Payer: Self-pay | Admitting: Podiatry

## 2019-11-22 NOTE — Progress Notes (Signed)
°  Subjective:  Patient ID: Jade [name], female    DOB: 12-06-1927,  MRN: [id number]  Chief Complaint  Patient presents with   Foot Pain    pt is here for routine foot care.   86 y.o. female returns for the above complaint.  Patient presents with painful thickened elongated toenails x10.  Patient states that they are getting long and is causing her pain when ambulating.  She denies any other acute complaints.  She states that she also needs a new refill on diabetic shoes as she has worn out the previous one and it has been past 1 year.  She denies any other acute complaints.  Objective:  There were no vitals filed for this visit. Podiatric Exam: Vascular: dorsalis pedis and posterior tibial pulses are palpable bilateral. Capillary return is immediate. Temperature gradient is WNL. Skin turgor WNL  Sensorium: Normal Semmes Weinstein monofilament test. Normal tactile sensation bilaterally. Nail Exam: Pt has thick disfigured discolored nails with subungual debris noted bilateral entire nail hallux through fifth toenails Ulcer Exam: There is no evidence of ulcer or pre-ulcerative changes or infection. Orthopedic Exam: Muscle tone and strength are WNL. No limitations in general ROM. No crepitus or effusions noted. HAV  B/L.  Hammer toes 2-5  B/L. Skin: No Porokeratosis. No infection or ulcers.  Xerosis bilateral lower extremity  Assessment & Plan:  Patient was evaluated and treated and all questions answered.  Onychomycosis with pain  -Nails palliatively debrided as below. -Educated on self-care  Bilateral bunion deformity -I explained to patient the etiology of bunion deformities and various treatment options were discussed.  Clinically patient only has mild pain to the bunion occasionally.  At this point I discussed with her conservative treatment options.  If this continues to get worse \\at  that point patient will benefit from bunion correction.  Patient states  understanding   Hammertoe contractures bilaterally -I explained to the patient the etiology of hammertoe contractures and various treatment options associated with it.  I believe patient will benefit from diabetic shoes as she has deformities of the foot that are present with underlying diabetes. -She has been casted for them.  Procedure: Nail Debridement Rationale: pain  Type of Debridement: manual, sharp debridement. Instrumentation: Nail nipper, rotary burr. Number of Nails: 10  Procedures and Treatment: Consent by patient was obtained for treatment procedures. The patient understood the discussion of treatment and procedures well. All questions were answered thoroughly reviewed. Debridement of mycotic and hypertrophic toenails, 1 through 5 bilateral and clearing of subungual debris. No ulceration, no infection noted.  Return Visit-Office Procedure: Patient instructed to return to the office for a follow up visit 3 months for continued evaluation and treatment.  Boneta Lucks, DPM    No follow-ups on file.

## 2019-12-18 ENCOUNTER — Ambulatory Visit: Payer: Medicare Other | Admitting: Cardiology

## 2019-12-24 ENCOUNTER — Other Ambulatory Visit: Payer: Self-pay

## 2019-12-24 ENCOUNTER — Ambulatory Visit: Payer: Medicare Other | Admitting: Cardiology

## 2019-12-24 ENCOUNTER — Encounter: Payer: Self-pay | Admitting: Cardiology

## 2019-12-24 VITALS — BP 134/79 | HR 69 | Resp 17 | Ht 59.0 in | Wt 101.0 lb

## 2019-12-24 DIAGNOSIS — I5032 Chronic diastolic (congestive) heart failure: Secondary | ICD-10-CM

## 2019-12-24 DIAGNOSIS — I10 Essential (primary) hypertension: Secondary | ICD-10-CM

## 2019-12-24 DIAGNOSIS — I2510 Atherosclerotic heart disease of native coronary artery without angina pectoris: Secondary | ICD-10-CM

## 2019-12-24 NOTE — Progress Notes (Signed)
Follow up visit  Subjective:   Jade [name], female    DOB: December 13, 1927, 86 y.o.   MRN: [id number]     HPI   Chief Complaint  Patient presents with  . Congestive Heart Failure  . Hypertension  . Follow-up    70 month    86 year old Caucasian female with hypertension, type 2 diabetes mellitus, hyperlipidemia, coronary artery disease, HFpEF, Bell's palsy  Patient is remarkably functional for her age.  She lives alone, performs all her daily activities by herself, including driving, mowing lawn etc.  With this level of activity, she denies chest pain, shortness of breath, palpitations, leg edema, orthopnea, PND, TIA/syncope.  Her home blood pressures run between 110-130/60-80 mmHg.  Blood pressure elevated today on first check, improved on second check.  She recently underwent upper endoscopy through Dr. Fuller Plan.  She tells me that there was some stomach irritation found.  She is currently taking aspirin 81 mg daily.     Current Outpatient Medications on File Prior to Visit  Medication Sig Dispense Refill  . acetaminophen (TYLENOL) 500 MG tablet Take by mouth.    Marland Kitchen amLODipine (NORVASC) 2.5 MG tablet Take 2.5 mg by mouth daily.    Marland Kitchen aspirin 81 MG tablet Take 81 mg by mouth daily.      Marland Kitchen atorvastatin (LIPITOR) 10 MG tablet Take 5 mg by mouth daily at 6 PM.     . Cholecalciferol (VITAMIN D) 1000 UNITS capsule Take 1,000 Units by mouth daily.      . ciprofloxacin (CILOXAN) 0.3 % ophthalmic solution 4 drops 2 (two) times daily.    . Coenzyme Q10 (COQ10) 50 MG CAPS Take 1 capsule by mouth daily.      . fish oil-omega-3 fatty acids 1000 MG capsule Take 1 capsule by mouth daily.      Marland Kitchen FREESTYLE LITE test strip TEST QAM  11  . glucose blood ([employer] TEST STRIPS) test strip Check blood sugar once daily    . hydrochlorothiazide (MICROZIDE) 12.5 MG capsule Take 1 capsule by mouth daily. 1/2    . lactobacillus acidophilus (BACID) TABS tablet Take 1 tablet by mouth daily. Probiotic     .  levothyroxine (SYNTHROID, LEVOTHROID) 75 MCG tablet Take 75 mcg by mouth daily.      . metFORMIN (GLUCOPHAGE) 500 MG tablet Take 1 tablet (500 mg total) by mouth daily with breakfast.    . methylcellulose (CITRUCEL) oral powder Mix 1 tablespoonful in 8 ounces of water and drink once daily.    . mupirocin ointment (BACTROBAN) 2 % as needed. nostrils    . olmesartan (BENICAR) 40 MG tablet Take 1 tablet by mouth daily.    . pantoprazole (PROTONIX) 40 MG tablet Take 1 tablet (40 mg total) by mouth daily. 30 tablet 5  . spironolactone (ALDACTONE) 25 MG tablet Take 1 tablet (25 mg total) by mouth daily. 90 tablet 3  . timolol (TIMOPTIC) 0.5 % ophthalmic solution Place 1 drop into both eyes 2 (two) times daily.   11   Current Facility-Administered Medications on File Prior to Visit  Medication Dose Route Frequency Provider Last Rate Last Admin  . 0.9 %  sodium chloride infusion  500 mL Intravenous Once Ladene Artist, MD        Cardiovascular & other pertient studies:  EKG 12/24/2019: Sinus rhythm 68 bpm First degree A-V block  Old anteroseptal infarct Low voltage in precordial leads  Echocardiogram 05/23/2018: Left ventricle cavity is normal in size. Mild concentric hypertrophy  of the left ventricle. Normal global wall motion. Doppler evidence of grade I (impaired) diastolic dysfunction, elevated LAP. Calculated EF 67%. Left atrial cavity is mild to moderately dilated, LA volume of 5.0 cm. Mild (Grade I) mitral regurgitation. Mild tricuspid regurgitation. Mild pulmonary hypertension. PAS pressure estimated at 31 mmHg, CVP 3 mmHg. Compared to the study done in 07/23/2010, left atrial size was previously normal, mild pulmonary hypertension is new.  Lexiscan myoview stress test 05/22/2018: 1. Lexiscan stress test was performed. Exercise capacity was not assessed. Stress symptoms included headache. Resting blood pressure was 160/78 mmHg and peak effect blood pressure was 128/56 mmHg. The  resting and stress electrocardiogram demonstrated normal sinus rhythm, normal resting conduction, possible old anteroseptal infarct, no resting arrhythmias and normal rest repolarization. Stress EKG is non diagnostic for ischemia as it is a pharmacologic stress. 2. The overall quality of the study is good. There is no evidence of abnormal lung activity. LV cavity is small. Stress SPECT images demonstrate homogeneous tracer distribution throughout the myocardium. Small inferior apical perfusion defect seen on rest images only likely represent tissue attenuation artifact. Gated SPECT imaging reveals normal myocardial thickening and wall motion. The left ventricular ejection fraction was normal (75%).  3. Low risk study.  Recent labs: 08/27/2019: H/H 12.9/39. MCV 93. Platelets 257  04/2019: Glucose 109, BUN/Cr 31/1.1. EGFR 44. HbA1C 5.8% Chol 132, TG 52, HDL 57, LDL 63 TSH 1.0 normal  12/2018: Glucose 96, BUN/Cr 36/1.18. EGFR 41. Na/K 136/4.9.      Review of Systems  Cardiovascular: Negative for chest pain, dyspnea on exertion, leg swelling, palpitations and syncope.         Vitals:   12/24/19 1444 12/24/19 1507  BP: (!) 151/79 134/79  Pulse: 70 69  Resp: 17   SpO2: 96%      Body mass index is 20.4 kg/m. Filed Weights   12/24/19 1444  Weight: 101 lb (45.8 kg)     Objective:   Physical Exam Vitals and nursing note reviewed.  Constitutional:      General: She is not in acute distress. Neck:     Vascular: No JVD.  Cardiovascular:     Rate and Rhythm: Normal rate and regular rhythm.     Heart sounds: Normal heart sounds. No murmur heard.   Pulmonary:     Effort: Pulmonary effort is normal.     Breath sounds: Normal breath sounds. No wheezing or rales.           Assessment & Recommendations:   86 year old Caucasian female with hypertension, type 2 diabetes mellitus, hyperlipidemia, coronary artery disease, HFpEF, Bell's palsy  CAD: Coronary calcification  without ischemia on stress test.  She is currently on aspirin 81 mg daily.  With her reported stomach irritation on endoscopy, I have reduced aspirin to 81 mg twice a week.  If needed, this can also be stopped in the future.  Continue Lipitor, hypertension controlled.  Hypertension: Fairly well controlled.  Consider combining olmesartan and hydrochlorothiazide into combination pill.  Will defer this to PCP as she will be seeing the more frequently.  HFpEF: Controlled. Euvolumic.   I will see her back in 1 year.    Nigel Mormon, MD Pager: [phone] Office: [phone]

## 2020-02-27 ENCOUNTER — Ambulatory Visit: Payer: Medicare Other | Admitting: Podiatry

## 2020-02-27 ENCOUNTER — Other Ambulatory Visit: Payer: Self-pay

## 2020-02-27 DIAGNOSIS — E1149 Type 2 diabetes mellitus with other diabetic neurological complication: Secondary | ICD-10-CM

## 2020-02-27 DIAGNOSIS — M79674 Pain in right toe(s): Secondary | ICD-10-CM | POA: Diagnosis not present

## 2020-02-27 DIAGNOSIS — M792 Neuralgia and neuritis, unspecified: Secondary | ICD-10-CM | POA: Diagnosis not present

## 2020-02-27 DIAGNOSIS — M79675 Pain in left toe(s): Secondary | ICD-10-CM

## 2020-02-27 DIAGNOSIS — B351 Tinea unguium: Secondary | ICD-10-CM | POA: Diagnosis not present

## 2020-02-28 ENCOUNTER — Encounter: Payer: Self-pay | Admitting: Podiatry

## 2020-02-28 NOTE — Progress Notes (Signed)
  Subjective:  Patient ID: Jade [name], female    DOB: 14-Aug-1927,  MRN: [id number]  Chief Complaint  Patient presents with  . Nail Problem    thick painful toenails, 3 month follow up  . Diabetes    diabetic foot exam   86 y.o. female returns for the above complaint.  Patient presents with painful thickened elongated toenails x10.  Patient states that they are getting long and is causing her pain when ambulating.  She denies any other acute complaints.  She states that she has also numbness in the ball of the foot that she feels.  She states that given that she is diabetic she does not know if that was really causing it.  She denies any other acute complaint she just wants to make sure that there is nothing else going on for the numbness in the ball of the right foot  Objective:  There were no vitals filed for this visit. Podiatric Exam: Vascular: dorsalis pedis and posterior tibial pulses are palpable bilateral. Capillary return is immediate. Temperature gradient is WNL. Skin turgor WNL  Sensorium: Decreased Semmes Weinstein monofilament test.  Decreased tactile sensation bilaterally. Nail Exam: Pt has thick disfigured discolored nails with subungual debris noted bilateral entire nail hallux through fifth toenails Ulcer Exam: There is no evidence of ulcer or pre-ulcerative changes or infection. Orthopedic Exam: Muscle tone and strength are WNL. No limitations in general ROM. No crepitus or effusions noted. HAV  B/L.  Hammer toes 2-5  B/L. Skin: No Porokeratosis. No infection or ulcers.  Xerosis bilateral lower extremity  Assessment & Plan:  Patient was evaluated and treated and all questions answered.  Onychomycosis with pain  -Nails palliatively debrided as below. -Educated on self-care  Numbness/neuropathic pain to the ball of the foot right side -I explained patient the etiology of numbness and various treatment options were extensively discussed.  I discussed with the patient  that this is likely attributed to her diabetes and may be ultimately attributed to uncontrolled sugar.  Her last A1c was is not known.  I discussed with her the importance of sugar control.  At this time she will work on better sugar control to help with the neuropathic pain.   Hammertoe contractures bilaterally -I explained to the patient the etiology of hammertoe contractures and various treatment options associated with it.  I believe patient will benefit from diabetic shoes as she has deformities of the foot that are present with underlying diabetes. -She has been casted for them.  Procedure: Nail Debridement Rationale: pain  Type of Debridement: manual, sharp debridement. Instrumentation: Nail nipper, rotary burr. Number of Nails: 10  Procedures and Treatment: Consent by patient was obtained for treatment procedures. The patient understood the discussion of treatment and procedures well. All questions were answered thoroughly reviewed. Debridement of mycotic and hypertrophic toenails, 1 through 5 bilateral and clearing of subungual debris. No ulceration, no infection noted.  Return Visit-Office Procedure: Patient instructed to return to the office for a follow up visit 3 months for continued evaluation and treatment.  Boneta Lucks, DPM    No follow-ups on file.

## 2020-03-03 ENCOUNTER — Other Ambulatory Visit: Payer: Self-pay | Admitting: Cardiology

## 2020-03-03 DIAGNOSIS — I10 Essential (primary) hypertension: Secondary | ICD-10-CM

## 2020-04-23 NOTE — Progress Notes (Signed)
    Subjective:    CC: Low back pain  I, Molly Weber, LAT, ATC, am serving as scribe for Dr. Clementeen Graham.  HPI: Pt is a 86 y/o female presenting w/ c/o low back pain x 4 weeks when she was cleaning up leaves and doing a lot of lifting/twisting .  She locates her pain to her lower back that radiates into her L LE to her L ankle.  Radiating pain: yes into her L LE to her ankle LE numbness/tingling: No LE weakness: No Aggravating factors: lumbar flexion; squatting; prolonged standing; transitioning from sit-to-stand Treatments tried: chiropractor; Tylenol; lidocaine cream; heat and ice  Pertinent review of Systems: No fevers or chills  Relevant historical information: Hypertension, diabetes, CKD   Objective:    Vitals:   04/24/20 1054  BP: 110/70  Pulse: 71  SpO2: 94%   General: Well Developed, well nourished, and in no acute distress.   MSK: L-spine normal-appearing nontender midline.  Tender palpation left lumbar paraspinal musculature. Decreased lumbar motion. Lower extremity flexors and sensation are equal and normal throughout. Strength diminished left foot dorsiflexion 4/5 otherwise normal throughout bilateral lower extremities Slow antalgic gait using a cane to ambulate  Lab and Radiology Results X-ray images L-spine obtained today personally and independently interpreted Diffuse degenerative change.  Anterolisthesis L4 on L5. Await formal radiology review   Impression and Recommendations:    Assessment and Plan: 86 y.o. female with new left-sided low back pain with left lumbar radiculopathy likely L5 dermatomal pattern.  This has been ongoing for about a month failing to improve with time and chiropractor care.  Patient does have some radicular component as well associate with a little bit of left leg weakness and dorsiflexion foot.  Discussed options.  Main treatment will be home health physical therapy.  She does not drive so she is a good candidate for home  health PT.  Discussed medications.  Ideally should be a reasonable candidate for prednisone and possibly gabapentin.  After discussion side effects and potential risks of these medications she elects to pause on them for now but is willing to consider prednisone in the future if needed.  Plan for x-rays today and potential MRI in the near future especially if not improving.  Check back in 1 month.  Discussed warning signs and symptoms that would prompt more urgent MRI including weakness.  PDMP not reviewed this encounter. Orders Placed This Encounter  Procedures  . DG Lumbar Spine Complete    Standing Status:   Future    Standing Expiration Date:   04/24/2021    Order Specific Question:   Reason for Exam (SYMPTOM  OR DIAGNOSIS REQUIRED)    Answer:   eval lumbar pain and left radiculopathy    Order Specific Question:   Preferred imaging location?    Answer:   [facility]  . Ambulatory referral to Home Health    Referral Priority:   Routine    Referral Type:   Home Health Care    Referral Reason:   Specialty Services Required    Requested Specialty:   Home Health Services    Number of Visits Requested:   1   No orders of the defined types were placed in this encounter.   Discussed warning signs or symptoms. Please see discharge instructions. Patient expresses understanding.   The above documentation has been reviewed and is accurate and complete Clementeen Graham, M.D.

## 2020-04-24 ENCOUNTER — Ambulatory Visit: Payer: Medicare Other | Admitting: Family Medicine

## 2020-04-24 ENCOUNTER — Ambulatory Visit (INDEPENDENT_AMBULATORY_CARE_PROVIDER_SITE_OTHER): Payer: Medicare Other

## 2020-04-24 ENCOUNTER — Encounter: Payer: Self-pay | Admitting: Family Medicine

## 2020-04-24 ENCOUNTER — Other Ambulatory Visit: Payer: Self-pay

## 2020-04-24 VITALS — BP 110/70 | HR 71 | Ht 59.0 in | Wt 103.2 lb

## 2020-04-24 DIAGNOSIS — M5442 Lumbago with sciatica, left side: Secondary | ICD-10-CM

## 2020-04-24 NOTE — Patient Instructions (Signed)
Thank you for coming in today.  Please get an Xray today before you leave  I've referred you to Physical Therapy.  Let us know if you don't hear from them in one week.  If you get worse please let me know. At that point medicine like prednisone may make more sense.   If not better with Physical Therapy or if worsening my next step is to get an MRI of your back to figure what exactly is the problem.   Recheck with me in about 1 month.  Please let me know if things are not going well.   Come back or go to the emergency room if you notice new weakness new numbness problems walking or bowel or bladder problems.

## 2020-04-28 ENCOUNTER — Telehealth: Payer: Self-pay | Admitting: Physical Therapy

## 2020-04-28 MED ORDER — PREDNISONE 10 MG PO TABS
30.0000 mg | ORAL_TABLET | Freq: Every day | ORAL | 0 refills | Status: DC
Start: 2020-04-28 — End: 2020-07-15

## 2020-04-28 MED ORDER — GABAPENTIN 100 MG PO CAPS
100.0000 mg | ORAL_CAPSULE | Freq: Three times a day (TID) | ORAL | 3 refills | Status: DC | PRN
Start: 2020-04-28 — End: 2021-12-01

## 2020-04-28 NOTE — Progress Notes (Signed)
X-ray lumbar spine shows significant arthritis however not severely changed compared to previous images.

## 2020-04-28 NOTE — Telephone Encounter (Signed)
Called pt to inform her of her L-spine XR results.  She has not been contacted by [facility] PT yet and states that she can't keep taking Tylenol "forever."  She asks if there is something else that she can be prescribed for pain.  [facility] [address] [facility] the correct pharmacy.  Please advise.

## 2020-04-28 NOTE — Addendum Note (Signed)
Addended by: Rodolph Bong on: 04/28/2020 02:33 PM   Modules accepted: Orders

## 2020-04-28 NOTE — Telephone Encounter (Signed)
Prednisone and gabapentin sent to pharmacy.  We talked about these medicines during her visit.  I do think they probably will be helpful and are generally safer than the alternative medicines that I could prescribe for this kind of pain.  I am using low doses of both medicines.

## 2020-04-29 NOTE — Telephone Encounter (Signed)
Patient called back. Given MD response. She expressed understanding and did not have any questions at this time.

## 2020-04-29 NOTE — Telephone Encounter (Signed)
Called and left message on VM informing pt that Dr. Denyse Amass has sent in some prescriptions for her to her preferred pharmacy.  Pt indicated yesterday that there are certain medications that she "won't take" but would not specify what those were at the time.  Please relay Dr. Zollie Pee message to pt regarding what he has prescribed.

## 2020-05-14 ENCOUNTER — Telehealth: Payer: Self-pay | Admitting: Family Medicine

## 2020-05-14 NOTE — Telephone Encounter (Signed)
I agree with Molly's plan. Schedule "Nurse visit" with ATCs to get home exercise plan teaching.

## 2020-05-14 NOTE — Telephone Encounter (Signed)
Patient called stating that no one contacted her about home health. She said that with the increase in COVID cases she is not comfortable having them come out to her home anyways.  She asked if there were any exercises that she can do herself at this time?  Please advise.

## 2020-05-15 NOTE — Telephone Encounter (Signed)
Left message for patient to call back to schedule.  °

## 2020-05-26 NOTE — Progress Notes (Signed)
   I, Peterson Lombard, LAT, ATC acting as a scribe for Lynne Leader, MD.  Jade [name] is a 87 y.o. female who presents to [place] at [facility] today for f/u L-sided LBP w/ sciatica. Pt was last seen by Dr. Georgina Snell on 04/24/20 and was advised to plan for home health PT. She was prescribed prednisone and Gabapentin on 04/29/20. Pt decided against home health PT, because she never heard from then and due to being concerned about increasing cases of COVID and not wanting a stranger in her home.  Today, pt reports back feels good at times and bad at others. Pt reports increased pain when laying down. With increased activity, pt will have pain in the low back, but primarily pt locates pain to lateral R hip.   Radiating pn: no LE numbness/tingling: no LE weakness: no Aggravates: transitioning from sitting to standing Rx tried: chiro, Tylenol, lidocaine cream, heat, ice  Dx imaging: 04/24/20 L-spine XR  Pertinent review of systems: No fevers or chills  Relevant historical information: History of heart failure diabetes and CAD   Exam:  BP 108/76 (BP Location: Left Arm, Patient Position: Sitting)   Ht 4\' 11"  (1.499 m)   Wt 101 lb 3.2 oz (45.9 kg)   BMI 20.44 kg/m  General: Well Developed, well nourished, and in no acute distress.   MSK: Left hip normal-appearing tender palpation greater trochanter.  Normal hip motion.  Hip abduction strength 4/5.  External rotation strength 4/5.  Internal rotation and adduction strength 5/5.  Normal gait.     Assessment and Plan: 87 y.o. female with left lateral hip pain thought to be due to hip abductor tendinopathy or trochanteric bursitis.  Plan for home exercise program taught in clinic today by ATC.  Recheck back in 6 weeks or sooner if needed.  Precautions reviewed.  Patient expresses understanding and agreement.  Of note this ideally would be referred to physical therapy however patient would like to avoid Covid risk which is  very reasonable.  [id number]; 15 additional minutes spent for Therapeutic exercises as stated in above notes.  This included exercises focusing on stretching, strengthening, with significant focus on eccentric aspects.   Long term goals include an improvement in range of motion, strength, endurance as well as avoiding reinjury. Patient's frequency would include in 1-2 times a day, 3-5 times a week for a duration of 6-12 weeks.  Proper technique shown and discussed handout in great detail with ATC.  All questions were discussed and answered.    Discussed warning signs or symptoms. Please see discharge instructions. Patient expresses understanding.   The above documentation has been reviewed and is accurate and complete Lynne Leader, M.D.

## 2020-05-27 ENCOUNTER — Ambulatory Visit: Payer: Medicare Other | Admitting: Family Medicine

## 2020-05-27 ENCOUNTER — Other Ambulatory Visit: Payer: Self-pay

## 2020-05-27 VITALS — BP 108/76 | Ht 59.0 in | Wt 101.2 lb

## 2020-05-27 DIAGNOSIS — M7062 Trochanteric bursitis, left hip: Secondary | ICD-10-CM | POA: Diagnosis not present

## 2020-05-27 NOTE — Patient Instructions (Addendum)
Thank you for coming in today.  Please complete the exercises that the athletic trainer went over with you: View at [email or web address] using code: [id number]  If not better we can do an injection.   Please use voltaren gel up to 4x daily for pain as needed.   Follow-up in clinic in 6 weeks.   Hip Bursitis  Hip bursitis is swelling of one or more fluid-filled sacs (bursae) in your hip joint. This condition can cause pain, and your symptoms may come and go over time. What are the causes?  Repeated use of your hip muscles.  Injury to the hip.  Weak butt muscles.  Bone spurs.  Infection. In some cases, the cause may not be known. What increases the risk? You are more likely to develop this condition if:  You had a past hip injury or hip surgery.  You have a condition, such as arthritis, gout, diabetes, or thyroid disease.  You have spine problems.  You have one leg that is shorter than the other.  You run a lot or do long-distance running.  You play sports where there is a risk of injury or falling, such as football, martial arts, or skiing. What are the signs or symptoms? Symptoms may come and go, and they often include:  Pain in the hip or groin area. Pain may get worse when you move your hip.  Tenderness and swelling of the hip. In rare cases, the bursa may become infected. If this happens, you may get a fever, as well as have warmth and redness in the hip area. How is this treated? This condition is treated by:  Resting your hip.  Icing your hip.  Wrapping the hip area with an elastic bandage (compression wrap).  Keeping the hip raised. Other treatments may include medicine, draining fluid out of the bursa, or using crutches, a cane, or a walker. Surgery may be needed, but this is rare. Long-term treatment may include doing exercises to help your strength and flexibility. It may also include lifestyle changes like losing weight to lessen the strain on your  hip. Follow these instructions at home: Managing pain, stiffness, and swelling  If told, put ice on the painful area. ? Put ice in a plastic bag. ? Place a towel between your skin and the bag. ? Leave the ice on for 20 minutes, 2-3 times a day.  Raise your hip by putting a pillow under your hips while you lie down. Stop if you feel pain.  If told, put heat on the affected area. Do this as often as told by your doctor. Use a moist heat pack or a heating pad as told by your doctor. ? Place a towel between your skin and the heat source. ? Leave the heat on for 20-30 minutes. ? Take off the heat if your skin turns bright red. This is very important if you are unable to feel pain, heat, or cold. You may have a greater risk of getting burned.      Activity  Do not use your hip to support your body weight until your doctor says that you can.  Use crutches, a cane, or a walker as told by your doctor.  If the affected leg is one that you use to drive, ask your doctor if it is safe to drive.  Rest and protect your hip as much as you can until you feel better.  Return to your normal activities as told by your doctor. Ask your doctor  what activities are safe for you.  Do exercises as told by your doctor. General instructions  Take over-the-counter and prescription medicines only as told by your doctor.  Gently rub and stretch your injured area as often as is comfortable.  Wear elastic bandages only as told by your doctor.  If one of your legs is shorter than the other, get fitted for a shoe insert or orthotic.  Keep a healthy weight. Follow instructions from your doctor.  Keep all follow-up visits as told by your doctor. This is important. How is this prevented?  Exercise regularly, as told by your doctor.  Wear the right shoes for the sport you play.  Warm up and stretch before being active. Cool down and stretch after being active.  Take breaks often from repeated  activity.  Avoid activities that bother your hip or cause pain.  Avoid sitting down for a long time. Where to find more information  [employer]: orthoinfo.aaos.org Contact a doctor if:  You have a fever.  You have new symptoms.  You have trouble walking or doing everyday activities.  You have pain that gets worse or does not get better with medicine.  Your skin around your hip is red.  You get a feeling of warmth in your hip area. Get help right away if:  You cannot move your hip.  You have very bad pain.  You cannot control the muscles in your feet. Summary  Hip bursitis is swelling of one or more fluid-filled sacs (bursae) in your hip joint.  Symptoms often come and go over time.  This condition is often treated by resting and icing the hip. It also may help to keep the area raised and wrapped in an elastic bandage. Other treatments may be needed. This information is not intended to replace advice given to you by your health care provider. Make sure you discuss any questions you have with your health care provider. Document Revised: 02/12/2019 Document Reviewed: 12/19/2017 Elsevier Patient Education  2021 [employer].

## 2020-06-01 ENCOUNTER — Other Ambulatory Visit: Payer: Self-pay | Admitting: Gastroenterology

## 2020-06-02 ENCOUNTER — Other Ambulatory Visit: Payer: Self-pay | Admitting: Gastroenterology

## 2020-06-04 ENCOUNTER — Ambulatory Visit: Payer: Medicare Other | Admitting: Podiatry

## 2020-07-02 ENCOUNTER — Other Ambulatory Visit: Payer: Self-pay | Admitting: Gastroenterology

## 2020-07-02 ENCOUNTER — Other Ambulatory Visit: Payer: Self-pay

## 2020-07-02 ENCOUNTER — Ambulatory Visit: Payer: Medicare Other | Admitting: Podiatry

## 2020-07-02 ENCOUNTER — Encounter: Payer: Self-pay | Admitting: Podiatry

## 2020-07-02 DIAGNOSIS — E1149 Type 2 diabetes mellitus with other diabetic neurological complication: Secondary | ICD-10-CM

## 2020-07-02 DIAGNOSIS — B351 Tinea unguium: Secondary | ICD-10-CM

## 2020-07-02 DIAGNOSIS — M79675 Pain in left toe(s): Secondary | ICD-10-CM

## 2020-07-02 DIAGNOSIS — M79674 Pain in right toe(s): Secondary | ICD-10-CM

## 2020-07-02 NOTE — Progress Notes (Signed)
  Subjective:  Patient ID: Jade [name], female    DOB: 1927-08-25,  MRN: [id number]  Chief Complaint  Patient presents with  . Nail Problem    Nail trim    87 y.o. female returns for the above complaint.  Patient presents with painful thickened elongated toenails x10.  Patient states that they are getting long and is causing her pain when ambulating.  She denies any other acute complaints.  She states that she has also numbness in the ball of the foot that she feels.  She states that given that she is diabetic she does not know if that was really causing it.  She denies any other acute complaint she just wants to make sure that there is nothing else going on for the numbness in the ball of the right foot  Objective:  There were no vitals filed for this visit. Podiatric Exam: Vascular: dorsalis pedis and posterior tibial pulses are palpable bilateral. Capillary return is immediate. Temperature gradient is WNL. Skin turgor WNL  Sensorium: Decreased Semmes Weinstein monofilament test.  Decreased tactile sensation bilaterally. Nail Exam: Pt has thick disfigured discolored nails with subungual debris noted bilateral entire nail hallux through fifth toenails Ulcer Exam: There is no evidence of ulcer or pre-ulcerative changes or infection. Orthopedic Exam: Muscle tone and strength are WNL. No limitations in general ROM. No crepitus or effusions noted. HAV  B/L.  Hammer toes 2-5  B/L. Skin: No Porokeratosis. No infection or ulcers.  Xerosis bilateral lower extremity  Assessment & Plan:  Patient was evaluated and treated and all questions answered.  Onychomycosis with pain  -Nails palliatively debrided as below. -Educated on self-care  Numbness/neuropathic pain to the ball of the foot right side -I explained patient the etiology of numbness and various treatment options were extensively discussed.  I discussed with the patient that this is likely attributed to her diabetes and may be  ultimately attributed to uncontrolled sugar.  Her last A1c was is not known.  I discussed with her the importance of sugar control.  At this time she will work on better sugar control to help with the neuropathic pain.   Hammertoe contractures bilaterally -I explained to the patient the etiology of hammertoe contractures and various treatment options associated with it.  I believe patient will benefit from diabetic shoes as she has deformities of the foot that are present with underlying diabetes. -She has been casted for them.  Procedure: Nail Debridement Rationale: pain  Type of Debridement: manual, sharp debridement. Instrumentation: Nail nipper, rotary burr. Number of Nails: 10  Procedures and Treatment: Consent by patient was obtained for treatment procedures. The patient understood the discussion of treatment and procedures well. All questions were answered thoroughly reviewed. Debridement of mycotic and hypertrophic toenails, 1 through 5 bilateral and clearing of subungual debris. No ulceration, no infection noted.  Return Visit-Office Procedure: Patient instructed to return to the office for a follow up visit 3 months for continued evaluation and treatment.  Boneta Lucks, DPM    Return in about 3 months (around 10/02/2020) for DR Prudence Davidson .

## 2020-07-04 NOTE — Progress Notes (Signed)
I, Jade [name], LAT, ATC acting as a scribe for Jade Leader, MD.  Jade [name] is a 87 y.o. female who presents to [facility] at [facility] today for f/u of L hip pain and LBP. Of note, pt has declined PT and home health PT referral due to risk for COVID exposure. Pt was last seen by Dr. Georgina Snell on 05/27/20 and was taught HEP focusing on glute strengthening. Today, pt reports some improvement and has been walking w/o cane sometimes. Pt c/o pain w/ trunk flexion. Pt locates pain to lateral aspect of L hip and central low back to mid-back. Pt has been compliant w/ HEP and requested a theraband w/ more resistance.  She notes now her low back is a little more painful.  She likes some dedicated exercises for that.  Additionally she notes that her right shoulder is a little uncomfortable.  She has persistent weakness with overhead motion.  She notes this has been ongoing for years.  She thinks she may have had a shoulder injury years ago but is not sure.   04/24/20 L-spine XR  Pertinent review of systems: No fevers or chills  Relevant historical information: Hypertension, heart failure, diabetes   Exam:  BP 125/77 (BP Location: Right Arm, Patient Position: Sitting, Cuff Size: Normal)   Pulse 68   Ht 4\' 11"  (1.499 m)   Wt 102 lb 12.8 oz (46.6 kg)   SpO2 93%   BMI 20.76 kg/m  General: Well Developed, well nourished, and in no acute distress.   MSK: Right shoulder normal-appearing nontender. Range of motion passive full.  Active abduction 100 degrees.  External rotation full internal rotation full. Strength 3/5 abduction 5/5 external/internal rotation  L-spine normal-appearing decreased lumbar motion.  Hip normal motion normal gait    Lab and Radiology Results DG Lumbar Spine Complete  Result Date: 04/24/2020 CLINICAL DATA:  Lumbar radiculopathy, low back pain after twisting back picking up leaves. EXAM: LUMBAR SPINE - COMPLETE 4+ VIEW COMPARISON:  CT exam  from January of 2020 FINDINGS: Degenerative changes throughout the lumbar spine. No sign of acute fracture or new subluxation. Five lumbar type vertebral bodies. Mild retrolisthesis of L1 on L2 with disc space narrowing at this level which is the most pronounced area disc space narrowing in the lumbar spine with moderate disc space narrowing at L2-3, L3-4, L4-5 and L5-S1. Facet arthropathy greatest at L4-5 and L5-S1 with marked facet arthropathy at these levels. Signs of vascular disease. Degenerative changes in the hips. IMPRESSION: No acute abnormality with marked degenerative changes in the spine with similar appearance to previous imaging. Electronically Signed   By: Zetta Bills M.D.   On: 04/24/2020 16:35  I, Jade [name], personally (independently) visualized and performed the interpretation of the images attached in this note.    Assessment and Plan: 87 y.o. 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  Again patient not willing to consider formal physical therapy.  Home exercise program taught in clinic today by ATC.  Right shoulder pain and weakness: Patient very likely has chronic full-thickness rotator cuff tear.  This has been ongoing for years and is very unlikely to be repairable.  Additionally patient is not very willing to consider surgery so it somewhat of a moot point.  Plan for home exercise program taught in clinic today by me for passive range  of motion and some strengthening.  Recheck back with me as needed for this and other issues.   PDMP not reviewed this encounter. No orders of the defined types were placed in this encounter.  No orders of the defined types were placed in this encounter.    Discussed warning signs or symptoms. Please see discharge instructions. Patient expresses understanding.   The above  documentation has been reviewed and is accurate and complete Jade [name], M.D.

## 2020-07-08 ENCOUNTER — Ambulatory Visit: Payer: Medicare Other | Admitting: Family Medicine

## 2020-07-08 ENCOUNTER — Other Ambulatory Visit: Payer: Self-pay

## 2020-07-08 VITALS — BP 125/77 | HR 68 | Ht 59.0 in | Wt 102.8 lb

## 2020-07-08 DIAGNOSIS — M7062 Trochanteric bursitis, left hip: Secondary | ICD-10-CM

## 2020-07-08 DIAGNOSIS — M5442 Lumbago with sciatica, left side: Secondary | ICD-10-CM

## 2020-07-08 DIAGNOSIS — R29898 Other symptoms and signs involving the musculoskeletal system: Secondary | ICD-10-CM | POA: Diagnosis not present

## 2020-07-08 NOTE — Patient Instructions (Addendum)
Thank you for coming in today.  Work on back exercises. View at [email or web address] using code: [facility]  Recheck with me as needed for these issues.   Work on shoulder motion.

## 2020-07-15 ENCOUNTER — Ambulatory Visit: Payer: Medicare Other | Admitting: Pulmonary Disease

## 2020-07-15 ENCOUNTER — Other Ambulatory Visit: Payer: Self-pay

## 2020-07-15 ENCOUNTER — Encounter: Payer: Self-pay | Admitting: Pulmonary Disease

## 2020-07-15 VITALS — BP 128/60 | HR 72 | Temp 97.4°F | Ht 59.0 in | Wt 101.4 lb

## 2020-07-15 DIAGNOSIS — R059 Cough, unspecified: Secondary | ICD-10-CM

## 2020-07-15 MED ORDER — FLUTICASONE PROPIONATE 50 MCG/ACT NA SUSP: 1.0000 | Freq: Every day | NASAL | 2 refills | Status: DC

## 2020-07-15 MED ORDER — IPRATROPIUM BROMIDE 0.03 % NA SOLN: 2.0000 | Freq: Two times a day (BID) | NASAL | 12 refills | Status: DC

## 2020-07-15 NOTE — Progress Notes (Signed)
Synopsis: Referred in March 2022 by Dr. Deland Pretty for   Subjective:   PATIENT ID: Jade [name] GENDER: female DOB: April 07, 1928, MRN: [id number]   HPI  Chief Complaint  Patient presents with  . Consult    Patient states that she has a productive cough with thick clear sputum and rattling in her lungs   Jade [name] is a 87 year old woman, never smoker with diabetes mellitus, CKD stage III and chronic diastolic heart failure who is referred to pulmonary clinic for evaluation of cough.   She reports she has had a progressive cough over the past couple of months with clear sputum production. She denies dyspnea or wheezing associated with the cough. She does have sinus congestion and drainage. She also reports occasional GERD symptoms. She is taking pantoprazole as needed.   Chest radiograph from 06/19/20 which does not show infiltrates or opacities. No pleural effusions noted. No increased interstitial prominence.   Past Medical History:  Diagnosis Date  . Allergic rhinitis   . Bronchitis   . Cataract   . CKD (chronic kidney disease)    stage 3 kidney failure per pt  . Diabetes mellitus   . Diverticulosis 11/2010  . GERD (gastroesophageal reflux disease)   . Glaucoma   . Glaucoma   . Hyperlipidemia   . Hypertension   . Hypothyroid   . Internal hemorrhoids   . Tubular adenoma polyp of rectum 02/2002  . Type 2 diabetes mellitus (HCC)      Family History  Problem Relation Age of Onset  . Lung cancer Father   . Esophageal cancer Father   . Heart disease Mother   . Heart disease Brother   . Diabetes Maternal Aunt   . Diabetes Maternal Uncle   . Diabetes Brother   . Colon cancer Neg Hx   . Rectal cancer Neg Hx   . Stomach cancer Neg Hx      Social History   Socioeconomic History  . Marital status: Widowed    Spouse name: Not on file  . Number of children: 2  . Years of education: Not on file  . Highest education level: Not on file  Occupational History   . Not on file  Tobacco Use  . Smoking status: Never Smoker  . Smokeless tobacco: Never Used  Vaping Use  . Vaping Use: Never used  Substance and Sexual Activity  . Alcohol use: No  . Drug use: No  . Sexual activity: Not on file  Other Topics Concern  . Not on file  Social History Narrative  . Not on file   Social Determinants of Health   Financial Resource Strain: Not on file  Food Insecurity: Not on file  Transportation Needs: Not on file  Physical Activity: Not on file  Stress: Not on file  Social Connections: Not on file  Intimate Partner Violence: Not on file     Allergies  Allergen Reactions  . Codeine   . Miralax [Polyethylene Glycol] Swelling    Feet swelling  . Pentazocine Nausea And Vomiting    Patient experiences Flashing lights  . Pentazocine Lactate   . Tramadol Hcl      Outpatient Medications Prior to Visit  Medication Sig Dispense Refill  . acetaminophen (TYLENOL) 500 MG tablet Take by mouth.    Marland Kitchen amLODipine (NORVASC) 2.5 MG tablet Take 2.5 mg by mouth daily.     Marland Kitchen aspirin 81 MG tablet Take 81 mg by mouth as directed. Twice a  week    . atorvastatin (LIPITOR) 10 MG tablet Take 5 mg by mouth daily at 6 PM.     . Cholecalciferol (VITAMIN D) 1000 UNITS capsule Take 1,000 Units by mouth daily.    . Coenzyme Q10 (COQ10) 50 MG CAPS Take 1 capsule by mouth daily.    . fish oil-omega-3 fatty acids 1000 MG capsule Take 1 capsule by mouth daily.    Marland Kitchen FREESTYLE LITE test strip TEST QAM  11  . gabapentin (NEURONTIN) 100 MG capsule Take 1 capsule (100 mg total) by mouth 3 (three) times daily as needed (Nerve pain). 30 capsule 3  . glucose blood ([employer] TEST STRIPS) test strip Check blood sugar once daily    . hydrochlorothiazide (MICROZIDE) 12.5 MG capsule Take 1 capsule by mouth daily. 1/2    . lactobacillus acidophilus (BACID) TABS tablet Take 1 tablet by mouth daily. Probiotic    . levothyroxine (SYNTHROID, LEVOTHROID) 75 MCG tablet Take 75 mcg by mouth daily.     . meclizine (ANTIVERT) 25 MG tablet Take 25 mg by mouth 3 (three) times daily as needed for dizziness.    . metFORMIN (GLUCOPHAGE) 500 MG tablet Take 1 tablet (500 mg total) by mouth daily with breakfast.    . methylcellulose (CITRUCEL) oral powder Mix 1 tablespoonful in 8 ounces of water and drink once daily.    . Multiple Vitamins-Minerals (ICAPS AREDS 2 PO) Take 1 tablet by mouth daily.    . mupirocin ointment (BACTROBAN) 2 % as needed. nostrils    . olmesartan (BENICAR) 40 MG tablet Take 1 tablet by mouth daily.    . pantoprazole (PROTONIX) 40 MG tablet TAKE 1 TABLET(40 MG) BY MOUTH DAILY 90 tablet 0  . spironolactone (ALDACTONE) 25 MG tablet TAKE 1 TABLET(25 MG) BY MOUTH DAILY 90 tablet 3  . timolol (TIMOPTIC) 0.5 % ophthalmic solution Place 1 drop into both eyes 2 (two) times daily.   11  . predniSONE (DELTASONE) 10 MG tablet Take 3 tablets (30 mg total) by mouth daily with breakfast. 15 tablet 0   Facility-Administered Medications Prior to Visit  Medication Dose Route Frequency Provider Last Rate Last Admin  . 0.9 %  sodium chloride infusion  500 mL Intravenous Once Ladene Artist, MD        Review of Systems  Constitutional: Negative for chills, fever, malaise/fatigue and weight loss.  HENT: Positive for congestion. Negative for sinus pain and sore throat.   Eyes: Negative.   Respiratory: Positive for cough and sputum production. Negative for hemoptysis, shortness of breath and wheezing.   Cardiovascular: Negative for chest pain, palpitations, orthopnea, claudication and leg swelling.  Gastrointestinal: Negative for abdominal pain, heartburn, nausea and vomiting.  Genitourinary: Negative.   Musculoskeletal: Negative for joint pain and myalgias.  Skin: Negative for rash.  Neurological: Negative for weakness.  Endo/Heme/Allergies: Negative.   Psychiatric/Behavioral: Negative.    Objective:   Vitals:   07/15/20 1342  BP: 128/60  Pulse: 72  Temp: (!) 97.4 F (36.3 C)   TempSrc: Temporal  SpO2: 97%  Weight: 101 lb 6.4 oz (46 kg)  Height: 4\' 11"  (1.499 m)   Physical Exam Constitutional:      General: She is not in acute distress.    Appearance: She is not ill-appearing.  HENT:     Head: Normocephalic and atraumatic.  Eyes:     General: No scleral icterus.    Conjunctiva/sclera: Conjunctivae normal.     Pupils: Pupils are equal, round, and reactive to  light.  Cardiovascular:     Rate and Rhythm: Normal rate and regular rhythm.     Pulses: Normal pulses.     Heart sounds: Normal heart sounds. No murmur heard.   Pulmonary:     Effort: Pulmonary effort is normal.     Breath sounds: Rales present. No wheezing or rhonchi.  Abdominal:     General: Bowel sounds are normal.     Palpations: Abdomen is soft.  Musculoskeletal:     Right lower leg: No edema.     Left lower leg: No edema.  Lymphadenopathy:     Cervical: No cervical adenopathy.  Skin:    General: Skin is warm and dry.  Neurological:     General: No focal deficit present.     Mental Status: She is alert.  Psychiatric:        Mood and Affect: Mood normal.        Behavior: Behavior normal.        Thought Content: Thought content normal.        Judgment: Judgment normal.     CBC    Component Value Date/Time   WBC 6.9 08/27/2019 1943   RBC 4.16 08/27/2019 1943   HGB 12.9 08/27/2019 1943   HGB 13.0 09/26/2017 1800   HCT 39.0 08/27/2019 1943   HCT 39.3 09/26/2017 1800   PLT 257 08/27/2019 1943   PLT 366 09/26/2017 1800   MCV 93.8 08/27/2019 1943   MCV 91 09/26/2017 1800   MCH 31.0 08/27/2019 1943   MCHC 33.1 08/27/2019 1943   RDW 13.0 08/27/2019 1943   RDW 13.7 09/26/2017 1800   LYMPHSABS 1.6 08/27/2019 1943   LYMPHSABS 1.8 09/26/2017 1800   MONOABS 0.5 08/27/2019 1943   EOSABS 0.3 08/27/2019 1943   EOSABS 0.5 (H) 09/26/2017 1800   BASOSABS 0.1 08/27/2019 1943   BASOSABS 0.1 09/26/2017 1800   BMP Latest Ref Rng & Units 01/22/2019 04/27/2018 04/26/2018  Glucose 65 - 99  mg/dL 96 99 88  BUN 8 - 27 mg/dL 36(H) 22 29(H)  Creatinine 0.57 - 1.00 mg/dL 1.18(H) 1.17(H) 1.26(H)  BUN/Creat Ratio 12 - 28 31(H) - -  Sodium 134 - 144 mmol/L 136 135 133(L)  Potassium 3.5 - 5.2 mmol/L 4.9 4.0 3.8  Chloride 96 - 106 mmol/L 102 102 100  CO2 20 - 29 mmol/L 23 25 23   Calcium 8.7 - 10.3 mg/dL 10.1 9.2 9.3   Chest imaging: CXR 06/19/20 Heart size and vascularity are normal. Negative for infiltrate or effusion. Lungs are well aerated. Mild degenerative change in the thoracic spine.   CT Abdomen/Pelvis 2021 Lower chest: Mild scarring in the lung bases bilaterally. Atherosclerotic calcifications in the left anterior descending, left circumflex and right coronary arteries.  PFT: No flowsheet data found.  Echo 12/09/2014: - Left ventricle: The cavity size was normal. Wall thickness was  normal. Systolic function was normal. The estimated ejection  fraction was in the range of 60% to 65%. Doppler parameters are  consistent with abnormal left ventricular relaxation (grade 1  diastolic dysfunction).  - Aortic valve: There was no stenosis.  - Mitral valve: There was no significant regurgitation.  - Left atrium: The atrium was mildly dilated.  - Right ventricle: The cavity size was normal. Systolic function  was normal.  - Right atrium: Chiari network noted.  - Tricuspid valve: Peak RV-RA gradient (S): 23 mm Hg.  - Pulmonary arteries: PA peak pressure: 26 mm Hg (S).  - Inferior vena cava: The vessel  was normal in size. The  respirophasic diameter changes were in the normal range (= 50%),  consistent with normal central venous pressure.  Assessment & Plan:   Cough - Plan: fluticasone (FLONASE) 50 MCG/ACT nasal spray, ipratropium (ATROVENT) 0.03 % nasal spray  Discussion: Shahad Mazurek is a 87 year old woman, never smoker with diabetes mellitus, CKD stage III and chronic diastolic heart failure who is referred to pulmonary clinic for evaluation of cough.    She likely has post-nasal drainage and possible GERD contributing to her cough. She is to take pantoprazole scheduled daily. She is to also start fluticasone nasal spray, 1 spray per nostril daily and ipratropium nasal spray, 2 sprays per nostril twice daily.   Chest radiograph on 06/19/20 is unremarkable. She does have rales on exam today, similar to Dr. Pennie Banter exam. These could be related to heart failure versus interstitial lung disease although she does not complain of progressive dyspnea at this time. We will treat patient conservatively as above and have her follow up in 1 month. If she continues to experience cough despite treatment we will then consider a high resolution CT chest scan for further evaluation. She does not appear to have asthma or reactive airways disease so will hold off on inhaler therapy trials.   Follow up in 1 month.   Freda Jackson, MD [facility] Office: [phone]    Current Outpatient Medications:  .  acetaminophen (TYLENOL) 500 MG tablet, Take by mouth., Disp: , Rfl:  .  amLODipine (NORVASC) 2.5 MG tablet, Take 2.5 mg by mouth daily. , Disp: , Rfl:  .  aspirin 81 MG tablet, Take 81 mg by mouth as directed. Twice a week, Disp: , Rfl:  .  atorvastatin (LIPITOR) 10 MG tablet, Take 5 mg by mouth daily at 6 PM. , Disp: , Rfl:  .  Cholecalciferol (VITAMIN D) 1000 UNITS capsule, Take 1,000 Units by mouth daily., Disp: , Rfl:  .  Coenzyme Q10 (COQ10) 50 MG CAPS, Take 1 capsule by mouth daily., Disp: , Rfl:  .  fish oil-omega-3 fatty acids 1000 MG capsule, Take 1 capsule by mouth daily., Disp: , Rfl:  .  fluticasone (FLONASE) 50 MCG/ACT nasal spray, Place 1 spray into both nostrils daily., Disp: 16 g, Rfl: 2 .  FREESTYLE LITE test strip, TEST QAM, Disp: , Rfl: 11 .  gabapentin (NEURONTIN) 100 MG capsule, Take 1 capsule (100 mg total) by mouth 3 (three) times daily as needed (Nerve pain)., Disp: 30 capsule, Rfl: 3 .  glucose blood ([employer]  TEST STRIPS) test strip, Check blood sugar once daily, Disp: , Rfl:  .  hydrochlorothiazide (MICROZIDE) 12.5 MG capsule, Take 1 capsule by mouth daily. 1/2, Disp: , Rfl:  .  ipratropium (ATROVENT) 0.03 % nasal spray, Place 2 sprays into both nostrils every 12 (twelve) hours., Disp: 30 mL, Rfl: 12 .  lactobacillus acidophilus (BACID) TABS tablet, Take 1 tablet by mouth daily. Probiotic, Disp: , Rfl:  .  levothyroxine (SYNTHROID, LEVOTHROID) 75 MCG tablet, Take 75 mcg by mouth daily., Disp: , Rfl:  .  meclizine (ANTIVERT) 25 MG tablet, Take 25 mg by mouth 3 (three) times daily as needed for dizziness., Disp: , Rfl:  .  metFORMIN (GLUCOPHAGE) 500 MG tablet, Take 1 tablet (500 mg total) by mouth daily with breakfast., Disp: , Rfl:  .  methylcellulose (CITRUCEL) oral powder, Mix 1 tablespoonful in 8 ounces of water and drink once daily., Disp: , Rfl:  .  Multiple  Vitamins-Minerals (ICAPS AREDS 2 PO), Take 1 tablet by mouth daily., Disp: , Rfl:  .  mupirocin ointment (BACTROBAN) 2 %, as needed. nostrils, Disp: , Rfl:  .  olmesartan (BENICAR) 40 MG tablet, Take 1 tablet by mouth daily., Disp: , Rfl:  .  pantoprazole (PROTONIX) 40 MG tablet, TAKE 1 TABLET(40 MG) BY MOUTH DAILY, Disp: 90 tablet, Rfl: 0 .  spironolactone (ALDACTONE) 25 MG tablet, TAKE 1 TABLET(25 MG) BY MOUTH DAILY, Disp: 90 tablet, Rfl: 3 .  timolol (TIMOPTIC) 0.5 % ophthalmic solution, Place 1 drop into both eyes 2 (two) times daily. , Disp: , Rfl: 11  Current Facility-Administered Medications:  .  0.9 %  sodium chloride infusion, 500 mL, Intravenous, Once, Ladene Artist, MD

## 2020-07-15 NOTE — Patient Instructions (Addendum)
Your cough is possibly due to sinus drainage or heartburn/reflux disease.  Start ipratropium nasal spray, 2 sprays per nostril twice daily  Start fluticasone nasal spray, 1 spray per nostril daily  Take pantoprazole 40mg  daily, 30 minutes before breakfast

## 2020-07-16 ENCOUNTER — Telehealth: Payer: Self-pay | Admitting: Pulmonary Disease

## 2020-07-16 NOTE — Telephone Encounter (Signed)
Call returned to patient, confirmed DOB. Patient reports she has glaucoma,  macular degeneration,  and CHF and these are contra-indicated for both Nasal sprays. Patient states can she take something OTC or what other recommendations does MD have.   JD please advise. Thanks :)

## 2020-07-16 NOTE — Telephone Encounter (Signed)
LMTCB  Will route message back to triage so message can be followed up on.

## 2020-07-16 NOTE — Telephone Encounter (Signed)
She can try azelastine nasal spray, this does not have reactions with her glaucoma.   The nasal sprays previously prescribed are not an issue for her history of heart failure. They can interact with her glaucome potentially. She can discuss the use of the fluticasone and ipratropium with her eye doctor if she would like. These would be the ideal medications to treat her cough.   Thanks, Wille Glaser

## 2020-07-23 ENCOUNTER — Encounter: Payer: Self-pay | Admitting: *Deleted

## 2020-07-23 NOTE — Telephone Encounter (Signed)
Tried calling pt and there was no answer and no option to leave [employer] letter

## 2020-07-31 ENCOUNTER — Telehealth: Payer: Self-pay | Admitting: Pulmonary Disease

## 2020-07-31 NOTE — Telephone Encounter (Signed)
Called and spoke with patient. She states that due to all of her health issues, she feels that Atrovent and Flonase would be too dangerous for her to take. Patient states she read one side affect is tongue swelling. States she lives alone and wouldn't be able call anyone.   JD please advise

## 2020-08-01 NOTE — Telephone Encounter (Signed)
The atrovent is safe to take. With any medication it can lead to allergic reaction with possible tongue swelling.   She has called in before about being concerned with the nasal sprays aggravating her glaucoma and heart failure.   If she does not feel comfortable trying these medications for her cough, that is ok. We are limited to then treating her GERD in regards to the cause of her cough. But from my last office note it sounded like she had post-nasal drainage which the nasal sprays would treat.  Thanks, Wille Glaser

## 2020-08-01 NOTE — Telephone Encounter (Signed)
ATC patient unable to reach LM to call back office (x1)  

## 2020-08-04 NOTE — Telephone Encounter (Signed)
Called and spoke with pt and she is aware of JD recs.  She stated that she is being treated for an ear infection and draining.  Pt voiced her understanding and nothing further is needed.

## 2020-08-19 ENCOUNTER — Encounter: Payer: Self-pay | Admitting: Pulmonary Disease

## 2020-08-19 ENCOUNTER — Ambulatory Visit: Payer: Medicare Other | Admitting: Pulmonary Disease

## 2020-08-19 ENCOUNTER — Other Ambulatory Visit: Payer: Self-pay

## 2020-08-19 VITALS — BP 122/68 | HR 76 | Temp 97.2°F | Ht 59.0 in | Wt 102.2 lb

## 2020-08-19 DIAGNOSIS — J302 Other seasonal allergic rhinitis: Secondary | ICD-10-CM | POA: Diagnosis not present

## 2020-08-19 DIAGNOSIS — R059 Cough, unspecified: Secondary | ICD-10-CM | POA: Diagnosis not present

## 2020-08-19 DIAGNOSIS — J3089 Other allergic rhinitis: Secondary | ICD-10-CM | POA: Diagnosis not present

## 2020-08-19 MED ORDER — AZELASTINE HCL 0.1 % NA SOLN: 2.0000 | Freq: Two times a day (BID) | NASAL | 3 refills | Status: DC

## 2020-08-19 NOTE — Patient Instructions (Addendum)
The chronic cough is likely from nasal drainage or GERD.   Use Azelastine Nasal Spray 2 sprays at bedtime. If the nasal spray is helping with your runny nose, then you can use 2 sprays in the morning time. This medication does not interfere with your glaucoma or macular degeneration.

## 2020-08-19 NOTE — Progress Notes (Signed)
Synopsis: Referred in March 2022 by Dr. Deland Pretty for   Subjective:   PATIENT ID: Jade [name] GENDER: female DOB: [birth year], MRN: [id number]  HPI  Chief Complaint  Patient presents with  . Follow-up    1 mo f/u for cough. States she did not feel comfortable with medications due to side effects. Cough has gotten better. Believes her cough is coming from sinus drainage, GERD and ear infection.    Stephanieann Gilland is a 87 year old woman, never smoker with diabetes mellitus, CKD stage III and chronic diastolic heart failure who returns to pulmonary clinic for evaluation of cough.   Her cough is likely related to post-nasal drainage and GERD. She did not try using the fluticasone nasal spray or ipratropium due to concerns of side effects.   She has been on protonix 40mg  daily for close a year for her GERD which she hasn't noticed much difference.   She continues to have sinus congestion and drainage. Her allergies are currently bothering her and leading to a scratchy throat if she talks too much.   Past Medical History:  Diagnosis Date  . Allergic rhinitis   . Bronchitis   . Cataract   . CKD (chronic kidney disease)    stage 3 kidney failure per pt  . Diabetes mellitus   . Diverticulosis 11/2010  . GERD (gastroesophageal reflux disease)   . Glaucoma   . Glaucoma   . Hyperlipidemia   . Hypertension   . Hypothyroid   . Internal hemorrhoids   . Tubular adenoma polyp of rectum 02/2002  . Type 2 diabetes mellitus (HCC)      Family History  Problem Relation Age of Onset  . Lung cancer Father   . Esophageal cancer Father   . Heart disease Mother   . Heart disease Brother   . Diabetes Maternal Aunt   . Diabetes Maternal Uncle   . Diabetes Brother   . Colon cancer Neg Hx   . Rectal cancer Neg Hx   . Stomach cancer Neg Hx      Social History   Socioeconomic History  . Marital status: Widowed    Spouse name: Not on file  . Number of children: 2  . Years of  education: Not on file  . Highest education level: Not on file  Occupational History  . Not on file  Tobacco Use  . Smoking status: Never Smoker  . Smokeless tobacco: Never Used  Vaping Use  . Vaping Use: Never used  Substance and Sexual Activity  . Alcohol use: No  . Drug use: No  . Sexual activity: Not on file  Other Topics Concern  . Not on file  Social History Narrative  . Not on file   Social Determinants of Health   Financial Resource Strain: Not on file  Food Insecurity: Not on file  Transportation Needs: Not on file  Physical Activity: Not on file  Stress: Not on file  Social Connections: Not on file  Intimate Partner Violence: Not on file     Allergies  Allergen Reactions  . Codeine   . Miralax [Polyethylene Glycol] Swelling    Feet swelling  . Pentazocine Nausea And Vomiting    Patient experiences Flashing lights  . Pentazocine Lactate   . Tramadol Hcl      Outpatient Medications Prior to Visit  Medication Sig Dispense Refill  . acetaminophen (TYLENOL) 500 MG tablet Take by mouth.    Marland Kitchen amLODipine (NORVASC) 2.5 MG tablet  Take 2.5 mg by mouth daily.     Marland Kitchen aspirin 81 MG tablet Take 81 mg by mouth as directed. Twice a week    . atorvastatin (LIPITOR) 10 MG tablet Take 5 mg by mouth daily at 6 PM.     . Cholecalciferol (VITAMIN D) 1000 UNITS capsule Take 1,000 Units by mouth daily.    . Coenzyme Q10 (COQ10) 50 MG CAPS Take 1 capsule by mouth daily.    . fish oil-omega-3 fatty acids 1000 MG capsule Take 1 capsule by mouth daily.    Marland Kitchen FREESTYLE LITE test strip TEST QAM  11  . gabapentin (NEURONTIN) 100 MG capsule Take 1 capsule (100 mg total) by mouth 3 (three) times daily as needed (Nerve pain). 30 capsule 3  . glucose blood ([employer] TEST STRIPS) test strip Check blood sugar once daily    . hydrochlorothiazide (MICROZIDE) 12.5 MG capsule Take 1 capsule by mouth daily. 1/2    . lactobacillus acidophilus (BACID) TABS tablet Take 1 tablet by mouth daily. Probiotic     . levothyroxine (SYNTHROID, LEVOTHROID) 75 MCG tablet Take 75 mcg by mouth daily.    . meclizine (ANTIVERT) 25 MG tablet Take 25 mg by mouth 3 (three) times daily as needed for dizziness.    . metFORMIN (GLUCOPHAGE) 500 MG tablet Take 1 tablet (500 mg total) by mouth daily with breakfast.    . methylcellulose (CITRUCEL) oral powder Mix 1 tablespoonful in 8 ounces of water and drink once daily.    . Multiple Vitamins-Minerals (ICAPS AREDS 2 PO) Take 1 tablet by mouth daily.    . mupirocin ointment (BACTROBAN) 2 % as needed. nostrils    . olmesartan (BENICAR) 40 MG tablet Take 1 tablet by mouth daily.    . pantoprazole (PROTONIX) 40 MG tablet TAKE 1 TABLET(40 MG) BY MOUTH DAILY 90 tablet 0  . spironolactone (ALDACTONE) 25 MG tablet TAKE 1 TABLET(25 MG) BY MOUTH DAILY 90 tablet 3  . timolol (TIMOPTIC) 0.5 % ophthalmic solution Place 1 drop into both eyes 2 (two) times daily.   11  . fluticasone (FLONASE) 50 MCG/ACT nasal spray Place 1 spray into both nostrils daily. 16 g 2  . ipratropium (ATROVENT) 0.03 % nasal spray Place 2 sprays into both nostrils every 12 (twelve) hours. 30 mL 12   Facility-Administered Medications Prior to Visit  Medication Dose Route Frequency Provider Last Rate Last Admin  . 0.9 %  sodium chloride infusion  500 mL Intravenous Once Ladene Artist, MD        Review of Systems  Constitutional: Negative for chills, fever, malaise/fatigue and weight loss.  HENT: Positive for congestion. Negative for sinus pain and sore throat.   Eyes: Negative.   Respiratory: Positive for cough and sputum production. Negative for hemoptysis, shortness of breath and wheezing.   Cardiovascular: Negative for chest pain, palpitations, orthopnea, claudication and leg swelling.  Gastrointestinal: Negative for abdominal pain, heartburn, nausea and vomiting.  Genitourinary: Negative.   Musculoskeletal: Negative for joint pain and myalgias.  Skin: Negative for rash.  Neurological: Negative  for weakness.  Endo/Heme/Allergies: Negative.   Psychiatric/Behavioral: Negative.    Objective:   Vitals:   08/19/20 1406  BP: 122/68  Pulse: 76  Temp: (!) 97.2 F (36.2 C)  TempSrc: Temporal  SpO2: 97%  Weight: 102 lb 3.2 oz (46.4 kg)  Height: 4\' 11"  (1.499 m)   Physical Exam Constitutional:      General: She is not in acute distress.    Appearance:  She is not ill-appearing.  HENT:     Head: Normocephalic and atraumatic.  Eyes:     General: No scleral icterus.    Conjunctiva/sclera: Conjunctivae normal.     Pupils: Pupils are equal, round, and reactive to light.  Cardiovascular:     Rate and Rhythm: Normal rate and regular rhythm.     Pulses: Normal pulses.     Heart sounds: Normal heart sounds. No murmur heard.   Pulmonary:     Effort: Pulmonary effort is normal.     Breath sounds: Rales present. No wheezing or rhonchi.  Abdominal:     General: Bowel sounds are normal.     Palpations: Abdomen is soft.  Musculoskeletal:     Right lower leg: No edema.     Left lower leg: No edema.  Lymphadenopathy:     Cervical: No cervical adenopathy.  Skin:    General: Skin is warm and dry.  Neurological:     General: No focal deficit present.     Mental Status: She is alert.  Psychiatric:        Mood and Affect: Mood normal.        Behavior: Behavior normal.        Thought Content: Thought content normal.        Judgment: Judgment normal.     CBC    Component Value Date/Time   WBC 6.9 08/27/2019 1943   RBC 4.16 08/27/2019 1943   HGB 12.9 08/27/2019 1943   HGB 13.0 09/26/2017 1800   HCT 39.0 08/27/2019 1943   HCT 39.3 09/26/2017 1800   PLT 257 08/27/2019 1943   PLT 366 09/26/2017 1800   MCV 93.8 08/27/2019 1943   MCV 91 09/26/2017 1800   MCH 31.0 08/27/2019 1943   MCHC 33.1 08/27/2019 1943   RDW 13.0 08/27/2019 1943   RDW 13.7 09/26/2017 1800   LYMPHSABS 1.6 08/27/2019 1943   LYMPHSABS 1.8 09/26/2017 1800   MONOABS 0.5 08/27/2019 1943   EOSABS 0.3  08/27/2019 1943   EOSABS 0.5 (H) 09/26/2017 1800   BASOSABS 0.1 08/27/2019 1943   BASOSABS 0.1 09/26/2017 1800   BMP Latest Ref Rng & Units 01/22/2019 04/27/2018 04/26/2018  Glucose 65 - 99 mg/dL 96 99 88  BUN 8 - 27 mg/dL 36(H) 22 29(H)  Creatinine 0.57 - 1.00 mg/dL 1.18(H) 1.17(H) 1.26(H)  BUN/Creat Ratio 12 - 28 31(H) - -  Sodium 134 - 144 mmol/L 136 135 133(L)  Potassium 3.5 - 5.2 mmol/L 4.9 4.0 3.8  Chloride 96 - 106 mmol/L 102 102 100  CO2 20 - 29 mmol/L 23 25 23   Calcium 8.7 - 10.3 mg/dL 10.1 9.2 9.3   Chest imaging: CXR 06/19/20 Heart size and vascularity are normal. Negative for infiltrate or effusion. Lungs are well aerated. Mild degenerative change in the thoracic spine.   CT Abdomen/Pelvis 2021 Lower chest: Mild scarring in the lung bases bilaterally. Atherosclerotic calcifications in the left anterior descending, left circumflex and right coronary arteries.  PFT: No flowsheet data found.  Echo 12/09/2014: - Left ventricle: The cavity size was normal. Wall thickness was  normal. Systolic function was normal. The estimated ejection  fraction was in the range of 60% to 65%. Doppler parameters are  consistent with abnormal left ventricular relaxation (grade 1  diastolic dysfunction).  - Aortic valve: There was no stenosis.  - Mitral valve: There was no significant regurgitation.  - Left atrium: The atrium was mildly dilated.  - Right ventricle: The cavity size was  normal. Systolic function  was normal.  - Right atrium: Chiari network noted.  - Tricuspid valve: Peak RV-RA gradient (S): 23 mm Hg.  - Pulmonary arteries: PA peak pressure: 26 mm Hg (S).  - Inferior vena cava: The vessel was normal in size. The  respirophasic diameter changes were in the normal range (= 50%),  consistent with normal central venous pressure.  Assessment & Plan:   Cough  Seasonal and perennial allergic rhinitis - Plan: azelastine (ASTELIN) 0.1 % nasal  spray  Discussion: Kerra Guilfoil is a 87 year old woman, never smoker with diabetes mellitus, CKD stage III and chronic diastolic heart failure who returns to pulmonary clinic for evaluation of cough.   She has post-nasal drainage and possible GERD contributing to her cough. She is to continue taking pantoprazole scheduled daily. Due to her history of glaucoma we will avoid intranasal steroid spray. She does not wish to try ipratropium nasal spray due to side effect concerns after reading the label. We discussed the other option for nasal spray would be azelastine which she is amenable to trying which she is to use 2 sprays per nostril twice daily.   Chest radiograph on 06/19/20 is unremarkable. She continues to have rales on exam today.These could be related to heart failure versus interstitial lung disease although she does not complain of progressive dyspnea at this time. We will treat patient conservatively as above and continue to monitor. If she continues to experience cough despite treatment we will then consider a high resolution CT chest scan for further evaluation. She does not appear to have asthma or reactive airways disease so will hold off on inhaler therapy trials.   Follow up in 6 months.   Freda Jackson, MD [facility] Office: [phone]    Current Outpatient Medications:  .  acetaminophen (TYLENOL) 500 MG tablet, Take by mouth., Disp: , Rfl:  .  amLODipine (NORVASC) 2.5 MG tablet, Take 2.5 mg by mouth daily. , Disp: , Rfl:  .  aspirin 81 MG tablet, Take 81 mg by mouth as directed. Twice a week, Disp: , Rfl:  .  atorvastatin (LIPITOR) 10 MG tablet, Take 5 mg by mouth daily at 6 PM. , Disp: , Rfl:  .  azelastine (ASTELIN) 0.1 % nasal spray, Place 2 sprays into both nostrils 2 (two) times daily. Use in each nostril as directed, Disp: 30 mL, Rfl: 3 .  Cholecalciferol (VITAMIN D) 1000 UNITS capsule, Take 1,000 Units by mouth daily., Disp: , Rfl:  .   Coenzyme Q10 (COQ10) 50 MG CAPS, Take 1 capsule by mouth daily., Disp: , Rfl:  .  fish oil-omega-3 fatty acids 1000 MG capsule, Take 1 capsule by mouth daily., Disp: , Rfl:  .  FREESTYLE LITE test strip, TEST QAM, Disp: , Rfl: 11 .  gabapentin (NEURONTIN) 100 MG capsule, Take 1 capsule (100 mg total) by mouth 3 (three) times daily as needed (Nerve pain)., Disp: 30 capsule, Rfl: 3 .  glucose blood ([employer] TEST STRIPS) test strip, Check blood sugar once daily, Disp: , Rfl:  .  hydrochlorothiazide (MICROZIDE) 12.5 MG capsule, Take 1 capsule by mouth daily. 1/2, Disp: , Rfl:  .  lactobacillus acidophilus (BACID) TABS tablet, Take 1 tablet by mouth daily. Probiotic, Disp: , Rfl:  .  levothyroxine (SYNTHROID, LEVOTHROID) 75 MCG tablet, Take 75 mcg by mouth daily., Disp: , Rfl:  .  meclizine (ANTIVERT) 25 MG tablet, Take 25 mg by mouth 3 (three) times daily as needed  for dizziness., Disp: , Rfl:  .  metFORMIN (GLUCOPHAGE) 500 MG tablet, Take 1 tablet (500 mg total) by mouth daily with breakfast., Disp: , Rfl:  .  methylcellulose (CITRUCEL) oral powder, Mix 1 tablespoonful in 8 ounces of water and drink once daily., Disp: , Rfl:  .  Multiple Vitamins-Minerals (ICAPS AREDS 2 PO), Take 1 tablet by mouth daily., Disp: , Rfl:  .  mupirocin ointment (BACTROBAN) 2 %, as needed. nostrils, Disp: , Rfl:  .  olmesartan (BENICAR) 40 MG tablet, Take 1 tablet by mouth daily., Disp: , Rfl:  .  pantoprazole (PROTONIX) 40 MG tablet, TAKE 1 TABLET(40 MG) BY MOUTH DAILY, Disp: 90 tablet, Rfl: 0 .  spironolactone (ALDACTONE) 25 MG tablet, TAKE 1 TABLET(25 MG) BY MOUTH DAILY, Disp: 90 tablet, Rfl: 3 .  timolol (TIMOPTIC) 0.5 % ophthalmic solution, Place 1 drop into both eyes 2 (two) times daily. , Disp: , Rfl: 11  Current Facility-Administered Medications:  .  0.9 %  sodium chloride infusion, 500 mL, Intravenous, Once, Ladene Artist, MD

## 2020-08-28 ENCOUNTER — Encounter: Payer: Self-pay | Admitting: Pulmonary Disease

## 2020-10-08 ENCOUNTER — Encounter: Payer: Self-pay | Admitting: Podiatry

## 2020-10-08 ENCOUNTER — Ambulatory Visit: Payer: Medicare Other | Admitting: Podiatry

## 2020-10-08 ENCOUNTER — Other Ambulatory Visit: Payer: Self-pay

## 2020-10-08 DIAGNOSIS — M2011 Hallux valgus (acquired), right foot: Secondary | ICD-10-CM

## 2020-10-08 DIAGNOSIS — I129 Hypertensive chronic kidney disease with stage 1 through stage 4 chronic kidney disease, or unspecified chronic kidney disease: Secondary | ICD-10-CM | POA: Insufficient documentation

## 2020-10-08 DIAGNOSIS — B351 Tinea unguium: Secondary | ICD-10-CM | POA: Diagnosis not present

## 2020-10-08 DIAGNOSIS — I440 Atrioventricular block, first degree: Secondary | ICD-10-CM | POA: Insufficient documentation

## 2020-10-08 DIAGNOSIS — E441 Mild protein-calorie malnutrition: Secondary | ICD-10-CM | POA: Insufficient documentation

## 2020-10-08 DIAGNOSIS — J309 Allergic rhinitis, unspecified: Secondary | ICD-10-CM | POA: Insufficient documentation

## 2020-10-08 DIAGNOSIS — R918 Other nonspecific abnormal finding of lung field: Secondary | ICD-10-CM | POA: Insufficient documentation

## 2020-10-08 DIAGNOSIS — K644 Residual hemorrhoidal skin tags: Secondary | ICD-10-CM | POA: Insufficient documentation

## 2020-10-08 DIAGNOSIS — E1149 Type 2 diabetes mellitus with other diabetic neurological complication: Secondary | ICD-10-CM | POA: Diagnosis not present

## 2020-10-08 DIAGNOSIS — H9190 Unspecified hearing loss, unspecified ear: Secondary | ICD-10-CM | POA: Insufficient documentation

## 2020-10-08 DIAGNOSIS — E1121 Type 2 diabetes mellitus with diabetic nephropathy: Secondary | ICD-10-CM | POA: Insufficient documentation

## 2020-10-08 DIAGNOSIS — M79675 Pain in left toe(s): Secondary | ICD-10-CM | POA: Diagnosis not present

## 2020-10-08 DIAGNOSIS — M810 Age-related osteoporosis without current pathological fracture: Secondary | ICD-10-CM | POA: Insufficient documentation

## 2020-10-08 DIAGNOSIS — R600 Localized edema: Secondary | ICD-10-CM | POA: Insufficient documentation

## 2020-10-08 DIAGNOSIS — Z0000 Encounter for general adult medical examination without abnormal findings: Secondary | ICD-10-CM | POA: Insufficient documentation

## 2020-10-08 DIAGNOSIS — M79674 Pain in right toe(s): Secondary | ICD-10-CM

## 2020-10-08 DIAGNOSIS — K5900 Constipation, unspecified: Secondary | ICD-10-CM | POA: Insufficient documentation

## 2020-10-08 DIAGNOSIS — M2012 Hallux valgus (acquired), left foot: Secondary | ICD-10-CM

## 2020-10-08 DIAGNOSIS — I700 Atherosclerosis of aorta: Secondary | ICD-10-CM | POA: Insufficient documentation

## 2020-10-08 DIAGNOSIS — M1711 Unilateral primary osteoarthritis, right knee: Secondary | ICD-10-CM | POA: Insufficient documentation

## 2020-10-08 DIAGNOSIS — G4700 Insomnia, unspecified: Secondary | ICD-10-CM | POA: Insufficient documentation

## 2020-10-08 NOTE — Progress Notes (Signed)
This patient returns to my office for at risk foot care.  This patient requires this care by a professional since this patient will be at risk due to having diabetes type 2 and CKD. This patient is unable to cut nails himself since the patient cannot reach his nails.These nails are painful walking and wearing shoes.  This patient presents for at risk foot care today.  General Appearance  Alert, conversant and in no acute stress.  Vascular  Dorsalis pedis and posterior tibial  pulses are palpable  bilaterally.  Capillary return is within normal limits  bilaterally. Temperature is within normal limits  bilaterally.  Neurologic  Senn-Weinstein monofilament wire test dimiinshed   bilaterally. Muscle power within normal limits bilaterally.  Nails Thick disfigured discolored nails with subungual debris  from hallux to fifth toes bilaterally. No evidence of bacterial infection or drainage bilaterally.  Orthopedic  No limitations of motion  feet .  No crepitus or effusions noted.  No bony pathology or digital deformities noted.  HAV  B/L>  Skin  normotropic skin with no porokeratosis noted bilaterally.  No signs of infections or ulcers noted.     Onychomycosis  Pain in right toes  Pain in left toes  Consent was obtained for treatment procedures.   Mechanical debridement of nails 1-5  bilaterally performed with a nail nipper.  Filed with dremel without incident.    Return office visit  10 weeks                   Told patient to return for periodic foot care and evaluation due to potential at risk complications.   Gardiner Barefoot DPM

## 2020-10-27 ENCOUNTER — Other Ambulatory Visit: Payer: Self-pay

## 2020-10-27 ENCOUNTER — Emergency Department (HOSPITAL_COMMUNITY): Payer: Medicare Other

## 2020-10-27 ENCOUNTER — Emergency Department (HOSPITAL_COMMUNITY)
Admission: EM | Admit: 2020-10-27 | Discharge: 2020-10-27 | Disposition: A | Discharge status: Home / Self Care | Payer: Medicare Other | Attending: Emergency Medicine | Admitting: Emergency Medicine

## 2020-10-27 ENCOUNTER — Encounter (HOSPITAL_COMMUNITY): Payer: Self-pay

## 2020-10-27 DIAGNOSIS — E039 Hypothyroidism, unspecified: Secondary | ICD-10-CM | POA: Insufficient documentation

## 2020-10-27 DIAGNOSIS — Z7982 Long term (current) use of aspirin: Secondary | ICD-10-CM | POA: Insufficient documentation

## 2020-10-27 DIAGNOSIS — R1032 Left lower quadrant pain: Secondary | ICD-10-CM | POA: Diagnosis present

## 2020-10-27 DIAGNOSIS — K5792 Diverticulitis of intestine, part unspecified, without perforation or abscess without bleeding: Secondary | ICD-10-CM | POA: Insufficient documentation

## 2020-10-27 DIAGNOSIS — I129 Hypertensive chronic kidney disease with stage 1 through stage 4 chronic kidney disease, or unspecified chronic kidney disease: Secondary | ICD-10-CM | POA: Diagnosis not present

## 2020-10-27 DIAGNOSIS — Z79899 Other long term (current) drug therapy: Secondary | ICD-10-CM | POA: Insufficient documentation

## 2020-10-27 DIAGNOSIS — N390 Urinary tract infection, site not specified: Secondary | ICD-10-CM | POA: Insufficient documentation

## 2020-10-27 DIAGNOSIS — N183 Chronic kidney disease, stage 3 unspecified: Secondary | ICD-10-CM | POA: Insufficient documentation

## 2020-10-27 DIAGNOSIS — E1122 Type 2 diabetes mellitus with diabetic chronic kidney disease: Secondary | ICD-10-CM | POA: Diagnosis not present

## 2020-10-27 LAB — LIPASE, BLOOD: Lipase: 40 U/L (ref 11–51)

## 2020-10-27 LAB — URINALYSIS, ROUTINE W REFLEX MICROSCOPIC
Bilirubin Urine: NEGATIVE
Glucose, UA: NEGATIVE mg/dL
Hgb urine dipstick: NEGATIVE
Ketones, ur: NEGATIVE mg/dL
Nitrite: NEGATIVE
Protein, ur: NEGATIVE mg/dL
Specific Gravity, Urine: 1.009 (ref 1.005–1.030)
WBC, UA: >50 WBC/hpf — ABNORMAL HIGH (ref 0–5)
pH: 5 (ref 5.0–8.0)

## 2020-10-27 LAB — COMPREHENSIVE METABOLIC PANEL
ALT: 16 U/L (ref 0–44)
AST: 17 U/L (ref 15–41)
Albumin: 3.9 g/dL (ref 3.5–5.0)
Alkaline Phosphatase: 86 U/L (ref 38–126)
Anion gap: 9 (ref 5–15)
BUN: 37 mg/dL — ABNORMAL HIGH (ref 8–23)
CO2: 25 mmol/L (ref 22–32)
Calcium: 10.5 mg/dL — ABNORMAL HIGH (ref 8.9–10.3)
Chloride: 103 mmol/L (ref 98–111)
Creatinine, Ser: 1.12 mg/dL — ABNORMAL HIGH (ref 0.44–1.00)
GFR, Estimated: 46 mL/min — ABNORMAL LOW (ref >60)
Glucose, Bld: 118 mg/dL — ABNORMAL HIGH (ref 70–99)
Potassium: 4.4 mmol/L (ref 3.5–5.1)
Sodium: 137 mmol/L (ref 135–145)
Total Bilirubin: 2 mg/dL — ABNORMAL HIGH (ref 0.3–1.2)
Total Protein: 8.5 g/dL — ABNORMAL HIGH (ref 6.5–8.1)

## 2020-10-27 LAB — CBC
HCT: 38.3 % (ref 36.0–46.0)
Hemoglobin: 12.1 g/dL (ref 12.0–15.0)
MCH: 29.4 pg (ref 26.0–34.0)
MCHC: 31.6 g/dL (ref 30.0–36.0)
MCV: 93 fL (ref 80.0–100.0)
Platelets: 266 10*3/uL (ref 150–400)
RBC: 4.12 MIL/uL (ref 3.87–5.11)
RDW: 14.1 % (ref 11.5–15.5)
WBC: 12.5 10*3/uL — ABNORMAL HIGH (ref 4.0–10.5)
nRBC: 0 % (ref 0.0–0.2)

## 2020-10-27 MED ORDER — CIPROFLOXACIN HCL 500 MG PO TABS
500.0000 mg | ORAL_TABLET | Freq: Once | ORAL | Status: AC
  Administered 2020-10-27: 500 mg via ORAL
  Filled 2020-10-27: qty 1

## 2020-10-27 MED ORDER — FENTANYL CITRATE (PF) 100 MCG/2ML IJ SOLN
50.0000 ug | Freq: Once | INTRAMUSCULAR | Status: AC
  Administered 2020-10-27: 50 ug via INTRAVENOUS
  Filled 2020-10-27: qty 2

## 2020-10-27 MED ORDER — SODIUM CHLORIDE 0.9 % IV BOLUS
500.0000 mL | Freq: Once | INTRAVENOUS | Status: AC
  Administered 2020-10-27: 500 mL via INTRAVENOUS

## 2020-10-27 MED ORDER — IOHEXOL 300 MG/ML  SOLN
75.0000 mL | Freq: Once | INTRAMUSCULAR | Status: AC | PRN
  Administered 2020-10-27: 60 mL via INTRAVENOUS

## 2020-10-27 MED ORDER — METRONIDAZOLE 500 MG PO TABS: 500.0000 mg | ORAL_TABLET | Freq: Two times a day (BID) | ORAL | 0 refills | Status: DC

## 2020-10-27 MED ORDER — CIPROFLOXACIN HCL 500 MG PO TABS: 500.0000 mg | ORAL_TABLET | Freq: Two times a day (BID) | ORAL | 0 refills | Status: DC

## 2020-10-27 MED ORDER — METRONIDAZOLE 500 MG PO TABS
500.0000 mg | ORAL_TABLET | Freq: Once | ORAL | Status: AC
  Administered 2020-10-27: 500 mg via ORAL
  Filled 2020-10-27: qty 1

## 2020-10-27 NOTE — Discharge Instructions (Addendum)
You were seen in the emergency department for left-sided abdominal pain.  Your CAT scan showed diverticulitis.  You also have signs of a urinary tract infection.  Please drink plenty of fluids.  Clear liquid diet and advance as tolerated.  Follow-up with your primary care doctor.  Return if any worsening or concerning symptoms

## 2020-10-27 NOTE — ED Provider Notes (Signed)
Emergency Medicine Provider Triage Evaluation Note  Jade [name] , a 87 y.o. female  was evaluated in triage.  Pt complains of left lower quadrant abdominal pain.  She has a history of diverticulitis and is concerned that this feels similar.  Denies any vomiting, diarrhea or fever.  Symptoms were minor yesterday but worsened today.  Review of Systems  Positive: Abdominal Negative: Vomiting, diarrhea  Physical Exam  BP (!) 142/89 (BP Location: Left Arm)   Pulse 83   Temp 98.5 F (36.9 C) (Oral)   Resp 18   Ht 4\' 11"  (1.499 m)   Wt 44.2 kg   SpO2 97%   BMI 19.67 kg/m  Gen:   Awake, no distress   Resp:  Normal effort  MSK:   Moves extremities without difficulty  Other:  Abdomen is soft  Medical Decision Making  Medically screening exam initiated at 4:14 PM.  Appropriate orders placed.  [name] was informed that the remainder of the evaluation will be completed by another provider, this initial triage assessment does not replace that evaluation, and the importance of remaining in the ED until their evaluation is complete.  Lab work ordered   Delia Heady, PA-C 10/27/20 [place]    Hayden Rasmussen, MD 10/28/20 1122

## 2020-10-27 NOTE — ED Notes (Signed)
[facility] requests to be called if the patient gets admitted. Contact information in the chart.

## 2020-10-27 NOTE — ED Provider Notes (Signed)
[facility] DEPT Provider Note   CSN: [id number] Arrival date & time: 10/27/20  1524     History Chief Complaint  Patient presents with   Abdominal Pain    Jade [name] is a 87 y.o. female.  Has multiple medical problems including CKD and diabetes.  She is complaining of left lower quadrant abdominal pain that started yesterday and worsened today.  Not associate with any nausea vomiting diarrhea fevers or chills.  No urinary symptoms.  She has had this before and she attributes it to her diverticulitis.  She rates it at 9 out of 10.  Worse with palpation.  She lives alone and her friend brought her here.  The history is provided by the patient.  Abdominal Pain Pain location:  LLQ Pain quality: aching   Pain radiates to:  Does not radiate Pain severity:  Severe Onset quality:  Gradual Duration:  2 days Timing:  Constant Progression:  Worsening Chronicity:  Recurrent Context: not trauma   Relieved by:  None tried Worsened by:  Palpation and movement Ineffective treatments:  None tried Associated symptoms: no chest pain, no chills, no constipation, no cough, no diarrhea, no dysuria, no fever, no hematuria, no nausea, no shortness of breath, no sore throat and no vomiting   Risk factors: being elderly       Past Medical History:  Diagnosis Date   Allergic rhinitis    Bronchitis    Cataract    CKD (chronic kidney disease)    stage 3 kidney failure per pt   Diabetes mellitus    Diverticulosis 11/2010   GERD (gastroesophageal reflux disease)    Glaucoma    Glaucoma    Hyperlipidemia    Hypertension    Hypothyroid    Internal hemorrhoids    Tubular adenoma polyp of rectum 02/2002   Type 2 diabetes mellitus [facility])     Patient Active Problem List   Diagnosis Date Noted   Age-related osteoporosis without current pathological fracture 10/08/2020   Allergic rhinitis 10/08/2020   Hardening of the aorta (main artery of the heart) ([place])  10/08/2020   Constipation 10/08/2020   Diabetic renal disease ([place]) 10/08/2020   Edema of lower extremity 10/08/2020   Encounter for general adult medical examination without abnormal findings 10/08/2020   External hemorrhoids 10/08/2020   First degree heart block 10/08/2020   Hearing loss 10/08/2020   Hypercalcemia 10/08/2020   Insomnia 10/08/2020   Lung field abnormal 10/08/2020   Malnutrition of mild degree Altamease Oiler: 75% to less than 90% of standard weight) ([place]) 10/08/2020   Chronic kidney disease due to hypertension 10/08/2020   Unilateral primary osteoarthritis, right knee 10/08/2020   Intermediate stage nonexudative age-related macular degeneration of both eyes 07/03/2019   Chronic diastolic (congestive) heart failure ([place]) 06/21/2018   Coronary artery calcification seen on CT scan 06/21/2018   Acute diverticulitis 04/26/2018   Diverticulitis of large intestine with abscess 04/24/2018   Bilateral pseudophakia 12/02/2014   Rectal bleeding 07/24/2013   Primary open angle glaucoma of both eyes, moderate stage 04/30/2013   Nuclear cataract 11/03/2011   PCO (posterior capsular opacification) 11/03/2011   Dermatochalasis 03/26/2011   Gastroesophageal reflux disease 02/08/2011   Glaucoma 02/08/2011   Hyperlipidemia 02/08/2011   Overactive bladder 02/08/2011   DYSPNEA ON EXERTION 02/03/2010   HYPOTHYROIDISM 08/07/2008   DM2 (diabetes mellitus, type 2) ([place]) 08/07/2008   Essential hypertension 08/07/2008   Seasonal and perennial allergic rhinitis 08/09/2007   BRONCHITIS 08/09/2007    Past  Surgical History:  Procedure Laterality Date   APPENDECTOMY     BREAST CYST EXCISION     CHOLECYSTECTOMY     COLONOSCOPY     DIVERTICULITIS  2004   SURGERY FOR DIVERTICULITIS PER PT.   HERNIA REPAIR     w/ mesh   POLYPECTOMY     TONSILLECTOMY     TOTAL ABDOMINAL HYSTERECTOMY W/ BILATERAL SALPINGOOPHORECTOMY       OB History   No obstetric history on file.     Family History   Problem Relation Age of Onset   Lung cancer Father    Esophageal cancer Father    Heart disease Mother    Heart disease Brother    Diabetes Maternal Aunt    Diabetes Maternal Uncle    Diabetes Brother    Colon cancer Neg Hx    Rectal cancer Neg Hx    Stomach cancer Neg Hx     Social History   Tobacco Use   Smoking status: Never   Smokeless tobacco: Never  Vaping Use   Vaping Use: Never used  Substance Use Topics   Alcohol use: No   Drug use: No    Home Medications Prior to Admission medications   Medication Sig Start Date End Date Taking? Authorizing Provider  acetaminophen (TYLENOL) 500 MG tablet Take by mouth.    [provider]  amLODipine (NORVASC) 2.5 MG tablet Take 2.5 mg by mouth daily.  07/24/19   [provider]  amLODipine (NORVASC) 2.5 MG tablet Take 1 tablet by mouth daily.    [provider]  aspirin 81 MG chewable tablet 1 tablet    [provider]  aspirin 81 MG tablet Take 81 mg by mouth as directed. Twice a week    [provider]  atorvastatin (LIPITOR) 10 MG tablet Take 5 mg by mouth daily at 6 PM.  12/15/10   [provider]  atorvastatin (LIPITOR) 10 MG tablet 1 tablet    [provider]  azelastine (ASTELIN) 0.1 % nasal spray Place 2 sprays into both nostrils 2 (two) times daily. Use in each nostril as directed 08/19/20   Freddi Starr, MD  betamethasone dipropionate 0.05 % lotion SMARTSIG:Sparingly Topical Daily PRN 09/09/20   [provider]  cephALEXin (KEFLEX) 250 MG capsule Take 250 mg by mouth 3 (three) times daily. 09/17/20   [provider]  Cholecalciferol (VITAMIN D) 1000 UNITS capsule Take 1,000 Units by mouth daily.    [provider]  cholecalciferol (VITAMIN D3) 25 MCG (1000 UNIT) tablet 800 IU    [provider]  Coenzyme Q10 (COQ10) 50 MG CAPS Take 1 capsule by mouth daily.    [provider]  fish oil-omega-3 fatty acids 1000 MG  capsule Take 1 capsule by mouth daily.    [provider]  FREESTYLE LITE test strip TEST QAM 09/13/14   [provider]  gabapentin (NEURONTIN) 100 MG capsule Take 1 capsule (100 mg total) by mouth 3 (three) times daily as needed (Nerve pain). 04/28/20   Gregor Hams, MD  glucose blood ([employer] TEST STRIPS) test strip Check blood sugar once daily    [provider]  hydrochlorothiazide (MICROZIDE) 12.5 MG capsule Take 1 capsule by mouth daily. 1/2    [provider]  lactobacillus acidophilus (BACID) TABS tablet Take 1 tablet by mouth daily. Probiotic    [provider]  levothyroxine (SYNTHROID) 75 MCG tablet 1 TABLET ONCE A DAY ORALLY 90 DAYS  [provider]  levothyroxine (SYNTHROID) 75 MCG tablet Take 1 tablet by mouth daily.    [provider]  levothyroxine (SYNTHROID, LEVOTHROID) 75 MCG tablet Take 75 mcg by mouth daily.    [provider]  meclizine (ANTIVERT) 25 MG tablet Take 25 mg by mouth 3 (three) times daily as needed for dizziness.    [provider]  Melatonin 3 MG CAPS 1 tablet at bedtime as needed    [provider]  metFORMIN (GLUCOPHAGE) 500 MG tablet Take 1 tablet (500 mg total) by mouth daily with breakfast. 04/28/18   Ghimire, Henreitta Leber, MD  methylcellulose (CITRUCEL) oral powder Mix 1 tablespoonful in 8 ounces of water and drink once daily.    [provider]  Multiple Vitamins-Minerals (ICAPS AREDS 2 PO) Take 1 tablet by mouth daily.    [provider]  mupirocin ointment (BACTROBAN) 2 % as needed. nostrils 12/19/18   [provider]  olmesartan (BENICAR) 40 MG tablet Take 1 tablet by mouth daily.    [provider]  pantoprazole (PROTONIX) 40 MG tablet TAKE 1 TABLET(40 MG) BY MOUTH DAILY 07/02/20   Ladene Artist, MD  pantoprazole (PROTONIX) 40 MG tablet 1 tablet 05/18/19   [provider]  spironolactone (ALDACTONE) 25 MG tablet TAKE 1  TABLET(25 MG) BY MOUTH DAILY 03/03/20   Patwardhan, Manish J, MD  timolol (TIMOPTIC) 0.5 % ophthalmic solution Place 1 drop into both eyes 2 (two) times daily.  08/26/14   [provider]    Allergies    Codeine, Miralax [polyethylene glycol], Pentazocine, Pentazocine lactate, and Tramadol hcl  Review of Systems   Review of Systems  Constitutional:  Negative for chills and fever.  HENT:  Negative for ear pain and sore throat.   Eyes:  Negative for pain and visual disturbance.  Respiratory:  Negative for cough and shortness of breath.   Cardiovascular:  Negative for chest pain and palpitations.  Gastrointestinal:  Positive for abdominal pain. Negative for constipation, diarrhea, nausea and vomiting.  Genitourinary:  Negative for dysuria and hematuria.  Musculoskeletal:  Negative for arthralgias and back pain.  Skin:  Negative for color change and rash.  Neurological:  Negative for seizures, syncope and headaches.  All other systems reviewed and are negative.  Physical Exam Updated Vital Signs BP (!) 142/89 (BP Location: Left Arm)   Pulse 83   Temp 98.5 F (36.9 C) (Oral)   Resp 18   Ht 4\' 11"  (1.499 m)   Wt 44.2 kg   SpO2 97%   BMI 19.67 kg/m   Physical Exam Vitals and nursing note reviewed.  Constitutional:      General: She is not in acute distress.    Appearance: Normal appearance. She is well-developed.  HENT:     Head: Normocephalic and atraumatic.  Eyes:     Conjunctiva/sclera: Conjunctivae normal.  Cardiovascular:     Rate and Rhythm: Normal rate and regular rhythm.     Heart sounds: No murmur heard. Pulmonary:     Effort: Pulmonary effort is normal. No respiratory distress.     Breath sounds: Normal breath sounds.  Abdominal:     Palpations: Abdomen is soft.     Tenderness: There is abdominal tenderness in the left lower quadrant. There is no guarding or rebound.  Musculoskeletal:        General: No deformity or signs of injury. Normal range of  motion.     Cervical back: Neck supple.  Skin:  General: Skin is warm and dry.  Neurological:     General: No focal deficit present.     Mental Status: She is alert.    ED Results / Procedures / Treatments   Labs (all labs ordered are listed, but only abnormal results are displayed) Labs Reviewed  COMPREHENSIVE METABOLIC PANEL - Abnormal; Notable for the following components:      Result Value   Glucose, Bld 118 (*)    BUN 37 (*)    Creatinine, Ser 1.12 (*)    Calcium 10.5 (*)    Total Protein 8.5 (*)    Total Bilirubin 2.0 (*)    GFR, Estimated 46 (*)    All other components within normal limits  CBC - Abnormal; Notable for the following components:   WBC 12.5 (*)    All other components within normal limits  URINALYSIS, ROUTINE W REFLEX MICROSCOPIC - Abnormal; Notable for the following components:   APPearance CLOUDY (*)    Leukocytes,Ua LARGE (*)    WBC, UA >50 (*)    Bacteria, UA MANY (*)    All other components within normal limits  URINE CULTURE  LIPASE, BLOOD    EKG None  Radiology CT Abdomen Pelvis W Contrast  Result Date: 10/27/2020 CLINICAL DATA:  87 year old with left lower quadrant pain. Query diverticulitis. EXAM: CT ABDOMEN AND PELVIS WITH CONTRAST TECHNIQUE: Multidetector CT imaging of the abdomen and pelvis was performed using the standard protocol following bolus administration of intravenous contrast. CONTRAST:  51mL OMNIPAQUE IOHEXOL 300 MG/ML  SOLN COMPARISON:  CT 04/26/2018 FINDINGS: Lower chest: Coronary artery calcifications. Minor lower lobe atelectasis. Irregular calcification in the descending thoracic aorta, unchanged from prior. Hepatobiliary: No focal hepatic lesion. There is mild intrahepatic biliary ductal dilatation. Normal common bile duct measuring 6 mm. Prior cholecystectomy. No visualized choledocholithiasis. Pancreas: Mild fatty atrophy. No pancreatic ductal dilatation or surrounding inflammatory changes. Spleen: Normal in size without  focal abnormality. Adrenals/Urinary Tract: No adrenal nodule. No hydronephrosis or perinephric edema. Mild bilateral renal parenchymal thinning. There are bilateral renal cysts. Symmetric excretion on delayed phase imaging. No evidence of renal stone. No solid renal lesion. Partially distended urinary bladder. Equivocal bladder wall thickening. Stomach/Bowel: There is pericolonic fat stranding about the proximal sigmoid colon in the region of multiple colonic diverticula with associated mural hypertrophy1, series 2, image 62, diverticulitis versus short segment colitis. No abscess or perforation. Moderate volume of stool throughout the colon, with redundancy of the transverse colon. No additional sites of colonic inflammation. The appendix is not definitively visualized, no evidence of appendicitis there is no small bowel obstruction or inflammation. Decompressed stomach. Vascular/Lymphatic: Advanced aortic and branch atherosclerosis. No aneurysm. Patent portal vein. Mesenteric veins are patent. No enlarged lymph nodes in the abdomen or pelvis. Reproductive: Hysterectomy.  No adnexal mass. Other: Fat stranding with trace free fluid in the pelvis/left lower quadrant. No abscess or perforation. No free air. No upper abdominal ascites. No abdominal wall hernia. Musculoskeletal: Multilevel degenerative disc disease and facet hypertrophy. There are no acute or suspicious osseous abnormalities. IMPRESSION: 1. Pericolonic fat stranding about the proximal sigmoid colon in the region of multiple colonic diverticula with associated mural hypertrophy, diverticulitis versus short segment colitis. No abscess or perforation. 2. Equivocal bladder wall thickening. Recommend correlation with urinalysis to exclude urinary tract infection. 3. Mild intrahepatic biliary ductal dilatation without distention of the common bile duct. Recommend correlation with LFTs. If LFTs are elevated, consider further assessment with MRCP after  resolution of acute event. Aortic Atherosclerosis (  ICD10-I70.0). Electronically Signed   By: Keith Rake M.D.   On: 10/27/2020 21:59    Procedures Procedures   Medications Ordered in ED Medications  fentaNYL (SUBLIMAZE) injection 50 mcg (50 mcg Intravenous Given 10/27/20 1811)  sodium chloride 0.9 % bolus 500 mL (0 mLs Intravenous Stopped 10/27/20 2235)  iohexol (OMNIPAQUE) 300 MG/ML solution 75 mL (60 mLs Intravenous Contrast Given 10/27/20 2141)  ciprofloxacin (CIPRO) tablet 500 mg (500 mg Oral Given 10/27/20 2238)  metroNIDAZOLE (FLAGYL) tablet 500 mg (500 mg Oral Given 10/27/20 2238)    ED Course  I have reviewed the triage vital signs and the nursing notes.  Pertinent labs & imaging results that were available during my care of the patient were reviewed by me and considered in my medical decision making (see chart for details).    MDM Rules/Calculators/A&P                         This patient complains of left lower quadrant abdominal pain; this involves an extensive number of treatment Options and is a complaint that carries with it a high risk of complications and Morbidity. The differential includes diverticulitis, colitis, UTI, renal stone, perforation, abscess  I ordered, reviewed and interpreted labs, which included CBC with elevated white count, stable hemoglobin, chemistries with mild elevation of BUN and creatinine stable from priors, isolated elevated T bili unclear significance, urinalysis with possible infection greater than 50 whites many bacteria nitrite negative I ordered medication IV fluids and pain medication, oral Cipro and Flagyl I ordered imaging studies which included CT abdomen pelvis and I independently    visualized and interpreted imaging which showed probable diverticulitis versus colitis, question bladder infection Previous records obtained and reviewed in epic, has been treated for diverticulitis before with Cipro and Flagyl  After the interventions stated  above, I reevaluated the patient and found patient's pain to be controlled.  She is here and hemodynamically stable.  Offered admission to the hospital for IV antibiotics and continued treatment versus outpatient treatment.  Patient would rather go home and states will return if feeling worse.  Given first dose of antibiotics in the department.  Prescription sent to pharmacy.  Return instructions discussed   Final Clinical Impression(s) / ED Diagnoses Final diagnoses:  Acute diverticulitis  Lower urinary tract infectious disease    Rx / DC Orders ED Discharge Orders          Ordered    ciprofloxacin (CIPRO) 500 MG tablet  Every 12 hours        10/27/20 2216    metroNIDAZOLE (FLAGYL) 500 MG tablet  2 times daily        10/27/20 2216             Hayden Rasmussen, MD 10/28/20 1122

## 2020-10-27 NOTE — ED Notes (Signed)
Labeled specimen cup provided to pt for urine collection per MD order. ENMiles 

## 2020-10-27 NOTE — ED Triage Notes (Signed)
Pt reports LLQ pain and denies N/V/D. Pt reports hx of diverticulitis.

## 2020-10-30 LAB — URINE CULTURE: Culture: >=100000 — AB

## 2020-10-31 ENCOUNTER — Telehealth: Payer: Self-pay | Admitting: Emergency Medicine

## 2020-10-31 NOTE — Progress Notes (Signed)
ED Antimicrobial Stewardship Positive Culture Follow Up   Jade [name] is an 87 y.o. female who presented to [facility] on 10/27/2020 with a chief complaint of  Chief Complaint  Patient presents with   Abdominal Pain    Recent Results (from the past 720 hour(s))  Urine culture     Status: Abnormal   Collection Time: 10/27/20  5:05 PM   Specimen: Urine, Random  Result Value Ref Range Status   Specimen Description   Final    URINE, RANDOM Performed at [facility], [place] [address], [place], [place] [address]    Special Requests   Final    NONE Performed at [facility], [place] [address], [place], [place] [address]    Culture >=100,000 COLONIES/mL ESCHERICHIA COLI (A)  Final   Report Status 10/30/2020 FINAL  Final   Organism ID, Bacteria ESCHERICHIA COLI (A)  Final      Susceptibility   Escherichia coli - MIC*    AMPICILLIN <=2 SENSITIVE Sensitive     CEFAZOLIN <=4 SENSITIVE Sensitive     CEFEPIME <=0.12 SENSITIVE Sensitive     CEFTRIAXONE <=0.25 SENSITIVE Sensitive     CIPROFLOXACIN <=0.25 SENSITIVE Sensitive     GENTAMICIN <=1 SENSITIVE Sensitive     IMIPENEM <=0.25 SENSITIVE Sensitive     NITROFURANTOIN <=16 SENSITIVE Sensitive     TRIMETH/SULFA <=20 SENSITIVE Sensitive     AMPICILLIN/SULBACTAM <=2 SENSITIVE Sensitive     PIP/TAZO <=4 SENSITIVE Sensitive     * >=100,000 COLONIES/mL ESCHERICHIA COLI   Current antibiotic regimen for diverticulitis has coverage for E coli.   Called patient to follow up on symptom improvement and medication side effects.   Advised patient to take ciprofloxacin 1 tablet once daily by mouth for the remaining 3 days of therapy.  Faustino Congress 10/31/2020, 11:47 AM Student Pharmacist

## 2020-10-31 NOTE — Telephone Encounter (Signed)
Post ED Visit - Positive Culture Follow-up  Culture report reviewed by antimicrobial stewardship pharmacist: [place] Team []  Elenor Quinones, Pharm.D. []  Heide Guile, Pharm.D., BCPS AQ-ID []  Parks Neptune, Pharm.D., BCPS []  Alycia Rossetti, Pharm.D., BCPS []  [place], Pharm.D., BCPS, AAHIVP []  Legrand Como, Pharm.D., BCPS, AAHIVP []  Salome Arnt, PharmD, BCPS []  Johnnette Gourd, PharmD, BCPS []  Hughes Better, PharmD, BCPS []  Leeroy Cha, PharmD []  Laqueta Linden, PharmD, BCPS []  Albertina Parr, PharmD  [place] Team []  Leodis Sias, PharmD []  Lindell Spar, PharmD []  Royetta Asal, PharmD []  Graylin Shiver, Rph []  Rema Fendt) Glennon Mac, PharmD []  Arlyn Dunning, PharmD []  Netta Cedars, PharmD []  Dia Sitter, PharmD []  Leone Haven, PharmD []  Gretta Arab, PharmD []  Theodis Shove, PharmD []  Peggyann Juba, PharmD [x]  Willadean Carol, PharmD   Positive urine culture Treated with Ciprofloxacin, organism sensitive to the same and no further patient follow-up is required at this time.  Sandi Raveling Ananiah Maciolek 10/31/2020, 3:56 PM

## 2020-12-23 ENCOUNTER — Encounter: Payer: Self-pay | Admitting: Podiatry

## 2020-12-23 ENCOUNTER — Ambulatory Visit: Payer: Medicare Other | Admitting: Podiatry

## 2020-12-23 ENCOUNTER — Other Ambulatory Visit: Payer: Self-pay

## 2020-12-23 DIAGNOSIS — E1149 Type 2 diabetes mellitus with other diabetic neurological complication: Secondary | ICD-10-CM | POA: Diagnosis not present

## 2020-12-23 DIAGNOSIS — M79675 Pain in left toe(s): Secondary | ICD-10-CM

## 2020-12-23 DIAGNOSIS — M2012 Hallux valgus (acquired), left foot: Secondary | ICD-10-CM

## 2020-12-23 DIAGNOSIS — M79674 Pain in right toe(s): Secondary | ICD-10-CM

## 2020-12-23 DIAGNOSIS — M2011 Hallux valgus (acquired), right foot: Secondary | ICD-10-CM | POA: Diagnosis not present

## 2020-12-23 DIAGNOSIS — B351 Tinea unguium: Secondary | ICD-10-CM

## 2020-12-23 NOTE — Progress Notes (Signed)
This patient returns to my office for at risk foot care.  This patient requires this care by a professional since this patient will be at risk due to having diabetes type 2 and CKD.  This patient is unable to cut nails himself since the patient cannot reach his nails.These nails are painful walking and wearing shoes.  This patient presents for at risk foot care today.  General Appearance  Alert, conversant and in no acute stress.  Vascular  Dorsalis pedis and posterior tibial  pulses are weakly  palpable  bilaterally.  Capillary return is within normal limits  bilaterally. Temperature is within normal limits  bilaterally.  Neurologic  Senn-Weinstein monofilament wire test dimiinshed   bilaterally. Muscle power within normal limits bilaterally.  Nails Thick disfigured discolored nails with subungual debris  from hallux to fifth toes bilaterally. No evidence of bacterial infection or drainage bilaterally.  Orthopedic  No limitations of motion  feet .  No crepitus or effusions noted.  No bony pathology or digital deformities noted.  HAV  B/L  Skin  normotropic skin with no porokeratosis noted bilaterally.  No signs of infections or ulcers noted.     Onychomycosis  Pain in right toes  Pain in left toes  Consent was obtained for treatment procedures.   Mechanical debridement of nails 1-5  bilaterally performed with a nail nipper.  Filed with dremel without incident.    Return office visit  10  weeks                   Told patient to return for periodic foot care and evaluation due to potential at risk complications.   Yanisa Goodgame DPM   

## 2020-12-24 ENCOUNTER — Encounter: Payer: Self-pay | Admitting: Cardiology

## 2020-12-24 ENCOUNTER — Ambulatory Visit: Payer: Medicare Other | Admitting: Cardiology

## 2020-12-24 VITALS — BP 150/85 | HR 60 | Temp 98.0°F | Resp 16 | Ht 59.0 in | Wt 97.0 lb

## 2020-12-24 DIAGNOSIS — I5032 Chronic diastolic (congestive) heart failure: Secondary | ICD-10-CM

## 2020-12-24 DIAGNOSIS — I10 Essential (primary) hypertension: Secondary | ICD-10-CM

## 2020-12-24 NOTE — Progress Notes (Signed)
 Follow up visit  Subjective:   Jade [name], female    DOB: 01/27/1928, 87 y.o.   MRN: [id number]     HPI   Chief Complaint  Patient presents with   Chronic diastolic (congestive) heart failure    CT score   Follow-up    1 year    87-year-old Caucasian female with hypertension, type 2 diabetes mellitus, hyperlipidemia, coronary artery disease, HFpEF, Bell's palsy  As with her last visit a year ago, patient continues to do remarkably well for her age.  She lives alone, performs all her daily activities by herself, including driving, mowing lawn etc. blood pressure at home is well controlled.  She denies any chest pain, shortness of breath symptoms.  She was recently seen in the ER for abdominal pain.  No acute etiology was identified.   Current Outpatient Medications on File Prior to Visit  Medication Sig Dispense Refill   acetaminophen (TYLENOL) 500 MG tablet Take by mouth.     amLODipine (NORVASC) 2.5 MG tablet Take 2.5 mg by mouth daily.      aspirin 81 MG chewable tablet 1 tablet     atorvastatin (LIPITOR) 10 MG tablet 1 tablet     azelastine (ASTELIN) 0.1 % nasal spray Place 2 sprays into both nostrils 2 (two) times daily. Use in each nostril as directed 30 mL 3   betamethasone dipropionate 0.05 % lotion SMARTSIG:Sparingly Topical Daily PRN     Cholecalciferol (VITAMIN D) 1000 UNITS capsule Take 1,000 Units by mouth daily.     Coenzyme Q10 (COQ10) 50 MG CAPS Take 1 capsule by mouth daily.     fish oil-omega-3 fatty acids 1000 MG capsule Take 1 capsule by mouth daily.     hydrochlorothiazide (MICROZIDE) 12.5 MG capsule Take 1 capsule by mouth daily. 1/2     levothyroxine (SYNTHROID) 75 MCG tablet 1 TABLET ONCE A DAY ORALLY 90 DAYS     Melatonin 3 MG CAPS 1 tablet at bedtime as needed     metFORMIN (GLUCOPHAGE) 500 MG tablet Take 1 tablet (500 mg total) by mouth daily with breakfast.     Multiple Vitamins-Minerals (ICAPS AREDS 2 PO) Take 1 tablet by mouth daily.      mupirocin ointment (BACTROBAN) 2 % as needed. nostrils     olmesartan (BENICAR) 40 MG tablet Take 1 tablet by mouth daily.     pantoprazole (PROTONIX) 40 MG tablet TAKE 1 TABLET(40 MG) BY MOUTH DAILY 90 tablet 0   pantoprazole (PROTONIX) 40 MG tablet 1 tablet     spironolactone (ALDACTONE) 25 MG tablet TAKE 1 TABLET(25 MG) BY MOUTH DAILY 90 tablet 3   timolol (TIMOPTIC) 0.5 % ophthalmic solution Place 1 drop into both eyes 2 (two) times daily.   11   FREESTYLE LITE test strip TEST QAM  11   gabapentin (NEURONTIN) 100 MG capsule Take 1 capsule (100 mg total) by mouth 3 (three) times daily as needed (Nerve pain). 30 capsule 3   glucose blood ([employer] TEST STRIPS) test strip Check blood sugar once daily     lactobacillus acidophilus (BACID) TABS tablet Take 1 tablet by mouth daily. Probiotic     meclizine (ANTIVERT) 25 MG tablet Take 25 mg by mouth 3 (three) times daily as needed for dizziness.     Current Facility-Administered Medications on File Prior to Visit  Medication Dose Route Frequency Provider Last Rate Last Admin   0.9 %  sodium chloride infusion  500 mL Intravenous Once Stark, Malcolm T,   MD        Cardiovascular & other pertient studies:  EKG 12/24/2020: Sinus rhythm 65 bpm First degree A-V block    Echocardiogram 05/23/2018: Left ventricle cavity is normal in size. Mild concentric hypertrophy of the left ventricle. Normal global wall motion. Doppler evidence of grade I (impaired) diastolic dysfunction, elevated LAP. Calculated EF 67%. Left atrial cavity is mild to moderately dilated, LA volume of 5.0 cm. Mild (Grade I) mitral regurgitation. Mild tricuspid regurgitation. Mild pulmonary hypertension.  PAS pressure estimated at 31 mmHg, CVP 3 mmHg. Compared to the study done in 07/23/2010, left atrial size was previously normal, mild pulmonary hypertension is new.   Lexiscan myoview stress test 05/22/2018: 1. Lexiscan stress test was performed. Exercise capacity was not  assessed. Stress symptoms included headache. Resting blood pressure was 160/78 mmHg and peak effect blood pressure was 128/56 mmHg. The resting and stress electrocardiogram demonstrated normal sinus rhythm, normal resting conduction, possible old anteroseptal infarct, no resting arrhythmias and normal rest repolarization.  Stress EKG is non diagnostic for ischemia as it is a pharmacologic stress. 2. The overall quality of the study is good. There is no evidence of abnormal lung activity. LV cavity is small. Stress SPECT images demonstrate homogeneous tracer distribution throughout the myocardium. Small inferior apical perfusion defect seen on rest images only likely represent tissue attenuation artifact. Gated SPECT imaging reveals normal myocardial thickening and wall motion. The left ventricular ejection fraction was normal (75%).   3. Low risk study.  Recent labs: 08/27/2019: H/H 12.9/39. MCV 93. Platelets 257  04/2019: Glucose 109, BUN/Cr 31/1.1. EGFR 44. HbA1C 5.8% Chol 132, TG 52, HDL 57, LDL 63 TSH 1.0 normal  12/2018: Glucose 96, BUN/Cr 36/1.18. EGFR 41. Na/K 136/4.9.      Review of Systems  Cardiovascular:  Negative for chest pain, dyspnea on exertion, leg swelling, palpitations and syncope.        Vitals:   12/24/20 1316  BP: (!) 150/85  Pulse: 60  Resp: 16  Temp: 98 F (36.7 C)  SpO2: 100%    Body mass index is 19.59 kg/m. Filed Weights   12/24/20 1316  Weight: 97 lb (44 kg)     Objective:   Physical Exam Vitals and nursing note reviewed.  Constitutional:      General: She is not in acute distress. Neck:     Vascular: No JVD.  Cardiovascular:     Rate and Rhythm: Normal rate and regular rhythm.     Heart sounds: Normal heart sounds. No murmur heard. Pulmonary:     Effort: Pulmonary effort is normal.     Breath sounds: Normal breath sounds. No wheezing or rales.          Assessment & Recommendations:   87-year-old Caucasian female with  hypertension, type 2 diabetes mellitus, hyperlipidemia, coronary artery disease, HFpEF, Bell's palsy  CAD: Coronary calcification without ischemia on stress test.  Given her age and overall clinical stability, I have discontinued aspirin to reduce bleeding risks.  Hypertension: Elevated today.  Home blood pressure log shows very well-controlled blood pressures.  Suspect whitecoat hypertension.  No change made.  HFpEF: Currently resolved.  Euvolemic.  F/u in 1 year.     J , MD Pager: [phone] Office: [phone] 

## 2021-01-07 ENCOUNTER — Other Ambulatory Visit: Payer: Medicare Other

## 2021-01-07 ENCOUNTER — Other Ambulatory Visit: Payer: Self-pay

## 2021-02-15 ENCOUNTER — Other Ambulatory Visit: Payer: Self-pay | Admitting: Cardiology

## 2021-02-15 DIAGNOSIS — I10 Essential (primary) hypertension: Secondary | ICD-10-CM

## 2021-03-25 ENCOUNTER — Ambulatory Visit: Payer: Medicare Other | Admitting: Podiatry

## 2021-03-25 ENCOUNTER — Encounter: Payer: Self-pay | Admitting: Podiatry

## 2021-03-25 ENCOUNTER — Other Ambulatory Visit: Payer: Self-pay

## 2021-03-25 DIAGNOSIS — E1149 Type 2 diabetes mellitus with other diabetic neurological complication: Secondary | ICD-10-CM

## 2021-03-25 DIAGNOSIS — M79674 Pain in right toe(s): Secondary | ICD-10-CM

## 2021-03-25 DIAGNOSIS — M79675 Pain in left toe(s): Secondary | ICD-10-CM

## 2021-03-25 DIAGNOSIS — B351 Tinea unguium: Secondary | ICD-10-CM | POA: Diagnosis not present

## 2021-03-25 DIAGNOSIS — M2011 Hallux valgus (acquired), right foot: Secondary | ICD-10-CM

## 2021-03-25 DIAGNOSIS — M2012 Hallux valgus (acquired), left foot: Secondary | ICD-10-CM

## 2021-03-25 NOTE — Progress Notes (Signed)
This patient returns to my office for at risk foot care.  This patient requires this care by a professional since this patient will be at risk due to having diabetes type 2 and CKD.  This patient is unable to cut nails himself since the patient cannot reach his nails.These nails are painful walking and wearing shoes.  This patient presents for at risk foot care today.  General Appearance  Alert, conversant and in no acute stress.  Vascular  Dorsalis pedis and posterior tibial  pulses are weakly  palpable  bilaterally.  Capillary return is within normal limits  bilaterally. Temperature is within normal limits  bilaterally.  Neurologic  Senn-Weinstein monofilament wire test dimiinshed   bilaterally. Muscle power within normal limits bilaterally.  Nails Thick disfigured discolored nails with subungual debris  from hallux to fifth toes bilaterally. No evidence of bacterial infection or drainage bilaterally.  Orthopedic  No limitations of motion  feet .  No crepitus or effusions noted.  No bony pathology or digital deformities noted.  HAV  B/L>  Skin  normotropic skin with no porokeratosis noted bilaterally.  No signs of infections or ulcers noted.     Onychomycosis  Pain in right toes  Pain in left toes  Consent was obtained for treatment procedures.   Mechanical debridement of nails 1-5  bilaterally performed with a nail nipper.  Filed with dremel without incident.    Return office visit  12 weeks                   Told patient to return for periodic foot care and evaluation due to potential at risk complications.   Gardiner Barefoot DPM

## 2021-07-01 ENCOUNTER — Ambulatory Visit: Payer: Medicare Other | Admitting: Podiatry

## 2021-07-01 ENCOUNTER — Encounter: Payer: Self-pay | Admitting: Podiatry

## 2021-07-01 ENCOUNTER — Other Ambulatory Visit: Payer: Self-pay

## 2021-07-01 DIAGNOSIS — K5792 Diverticulitis of intestine, part unspecified, without perforation or abscess without bleeding: Secondary | ICD-10-CM | POA: Insufficient documentation

## 2021-07-01 DIAGNOSIS — B351 Tinea unguium: Secondary | ICD-10-CM | POA: Diagnosis not present

## 2021-07-01 DIAGNOSIS — F22 Delusional disorders: Secondary | ICD-10-CM | POA: Insufficient documentation

## 2021-07-01 DIAGNOSIS — E1149 Type 2 diabetes mellitus with other diabetic neurological complication: Secondary | ICD-10-CM | POA: Diagnosis not present

## 2021-07-01 DIAGNOSIS — M2011 Hallux valgus (acquired), right foot: Secondary | ICD-10-CM

## 2021-07-01 DIAGNOSIS — M2012 Hallux valgus (acquired), left foot: Secondary | ICD-10-CM

## 2021-07-01 DIAGNOSIS — M79675 Pain in left toe(s): Secondary | ICD-10-CM

## 2021-07-01 DIAGNOSIS — M79674 Pain in right toe(s): Secondary | ICD-10-CM

## 2021-07-01 DIAGNOSIS — E559 Vitamin D deficiency, unspecified: Secondary | ICD-10-CM | POA: Insufficient documentation

## 2021-07-01 DIAGNOSIS — N1832 Chronic kidney disease, stage 3b: Secondary | ICD-10-CM | POA: Insufficient documentation

## 2021-07-01 NOTE — Progress Notes (Signed)
This patient returns to my office for at risk foot care.  This patient requires this care by a professional since this patient will be at risk due to having diabetes type 2 and CKD.  This patient is unable to cut nails himself since the patient cannot reach his nails.These nails are painful walking and wearing shoes.  This patient presents for at risk foot care today. ? ?General Appearance  Alert, conversant and in no acute stress. ? ?Vascular  Dorsalis pedis and posterior tibial  pulses are weakly  palpable  bilaterally.  Capillary return is within normal limits  bilaterally. Temperature is within normal limits  bilaterally. ? ?Neurologic  Senn-Weinstein monofilament wire test dimiinshed   bilaterally. Muscle power within normal limits bilaterally. ? ?Nails Thick disfigured discolored nails with subungual debris  from hallux to fifth toes bilaterally. No evidence of bacterial infection or drainage bilaterally. ? ?Orthopedic  No limitations of motion  feet .  No crepitus or effusions noted.  No bony pathology or digital deformities noted.  HAV  B/L> ? ?Skin  normotropic skin with no porokeratosis noted bilaterally.  No signs of infections or ulcers noted.    ? ?Onychomycosis  Pain in right toes  Pain in left toes ? ?Consent was obtained for treatment procedures.   Mechanical debridement of nails 1-5  bilaterally performed with a nail nipper.  Filed with dremel without incident.  ? ? ?Return office visit  12 weeks                   Told patient to return for periodic foot care and evaluation due to potential at risk complications. ? ? ?Gardiner Barefoot DPM   ?

## 2021-10-07 ENCOUNTER — Ambulatory Visit: Payer: Medicare Other | Admitting: Podiatry

## 2021-10-07 ENCOUNTER — Encounter: Payer: Self-pay | Admitting: Podiatry

## 2021-10-07 DIAGNOSIS — E1149 Type 2 diabetes mellitus with other diabetic neurological complication: Secondary | ICD-10-CM

## 2021-10-07 DIAGNOSIS — B351 Tinea unguium: Secondary | ICD-10-CM | POA: Diagnosis not present

## 2021-10-07 DIAGNOSIS — M79674 Pain in right toe(s): Secondary | ICD-10-CM | POA: Diagnosis not present

## 2021-10-07 DIAGNOSIS — M79675 Pain in left toe(s): Secondary | ICD-10-CM

## 2021-10-07 NOTE — Progress Notes (Addendum)
This patient returns to my office for at risk foot care.  This patient requires this care by a professional since this patient will be at risk due to having diabetes type 2 and CKD.  This patient is unable to cut nails himself since the patient cannot reach his nails.These nails are painful walking and wearing shoes.  This patient presents for at risk foot care today.  General Appearance  Alert, conversant and in no acute stress.  Vascular  Dorsalis pedis and posterior tibial  pulses are weakly  palpable  bilaterally.  Capillary return is within normal limits  bilaterally. Temperature is within normal limits  bilaterally.  Neurologic  Senn-Weinstein monofilament wire test dimiinshed   bilaterally. Muscle power within normal limits bilaterally.  Nails Thick disfigured discolored nails with subungual debris  from hallux to fifth toes bilaterally. No evidence of bacterial infection or drainage bilaterally.  Orthopedic  No limitations of motion  feet .  No crepitus or effusions noted.  No bony pathology or digital deformities noted.  HAV  B/L  Skin  normotropic skin with no porokeratosis noted bilaterally.  No signs of infections or ulcers noted.     Onychomycosis  Pain in right toes  Pain in left toes  Consent was obtained for treatment procedures.   Mechanical debridement of nails 1-5  bilaterally performed with a nail nipper.  Filed with dremel without incident.    Return office visit  10  weeks                   Told patient to return for periodic foot care and evaluation due to potential at risk complications.   Deseree Zemaitis DPM   

## 2021-11-12 ENCOUNTER — Ambulatory Visit (INDEPENDENT_AMBULATORY_CARE_PROVIDER_SITE_OTHER): Payer: Medicare Other | Admitting: Family Medicine

## 2021-11-12 ENCOUNTER — Ambulatory Visit (INDEPENDENT_AMBULATORY_CARE_PROVIDER_SITE_OTHER): Payer: Medicare Other

## 2021-11-12 VITALS — BP 124/76 | HR 76 | Ht 59.0 in | Wt 96.6 lb

## 2021-11-12 DIAGNOSIS — M542 Cervicalgia: Secondary | ICD-10-CM | POA: Diagnosis not present

## 2021-11-12 NOTE — Patient Instructions (Addendum)
Thank you for coming in today.   Please get an Xray today before you leave   Try using a heating pad.  If not better in 2 weeks, let me know and I can order physical therapy

## 2021-11-12 NOTE — Progress Notes (Signed)
   I, Jade [name], LAT, ATC acting as a scribe for Jade Leader, MD.  Jade [name] is a 88 y.o. female who presents to [facility] today for neck pain. Pt was previously seen by Dr. Georgina [name] on 07/08/20 for L hip and low back pain. Today, pt c/o neck pain x 3-4 days. Pt locates pain to L-side of her neck. Pt c/o "popping" in her neck and decreased L-side cervical rotation.   Radiates: no UE Numbness/tingling: no UE Weakness: no Aggravates: L-sided cervical rotation Treatments tried: no  Dx imaging: 05/08/15 C-spine CT  Pertinent review of systems: No fevers or chills  Relevant historical information: Diabetes   Exam:  BP 124/76   Pulse 76   Ht '4\' 11"'$  (1.499 m)   Wt 96 lb 9.6 oz (43.8 kg)   SpO2 97%   BMI 19.51 kg/m  General: Well Developed, well nourished, and in no acute distress.   MSK: C-spine: Normal. Nontender midline.  Tender palpation left cervical paraspinal musculature. Decreased cervical motion reduced left lateral flexion and left rotation. Decreased extension. Normal flexion. Number extremity strength intact with exception of right shoulder adduction. Pulses capillary fill and sensation are intact distally. Reflexes are intact.   Lab and Radiology Results  Surgery images C-spine obtained today personally and independently interpreted.  X-rays today compared to previous cervical spine CT scan from 2017. Multilevel cervical fusion with significant degenerative changes. Await formal radiology review    Assessment and Plan: 88 y.o. female with neck pain. Jade [name] has a 3 to 4-day history of acute left cervical pain thought to be related to muscle spasm and dysfunction.  She does have considerable DJD on imaging however she did not have acute pain until just recently so the arthritis changes are probably contributory but not the main issue.  If not improving in about 2 weeks she will let me know and I will order physical  therapy.Marland Kitchen   PDMP not reviewed this encounter. Orders Placed This Encounter  Procedures   DG Cervical Spine 2 or 3 views    Standing Status:   Future    Number of Occurrences:   1    Standing Expiration Date:   11/13/2022    Order Specific Question:   Reason for Exam (SYMPTOM  OR DIAGNOSIS REQUIRED)    Answer:   neck pain    Order Specific Question:   Preferred imaging location?    Answer:   Jade [name]   No orders of the defined types were placed in this encounter.    Discussed warning signs or symptoms. Please see discharge instructions. Patient expresses understanding.   The above documentation has been reviewed and is accurate and complete Jade [name], M.D.

## 2021-11-13 NOTE — Progress Notes (Signed)
Advanced arthritis is present in the cervical spine.

## 2021-11-30 ENCOUNTER — Encounter (HOSPITAL_BASED_OUTPATIENT_CLINIC_OR_DEPARTMENT_OTHER): Payer: Self-pay

## 2021-11-30 ENCOUNTER — Emergency Department (HOSPITAL_BASED_OUTPATIENT_CLINIC_OR_DEPARTMENT_OTHER): Payer: Medicare Other

## 2021-11-30 ENCOUNTER — Inpatient Hospital Stay (HOSPITAL_BASED_OUTPATIENT_CLINIC_OR_DEPARTMENT_OTHER)
Admission: EM | Admit: 2021-11-30 | Discharge: 2021-12-04 | DRG: 392 | Disposition: A | Discharge status: Home / Self Care | Payer: Medicare Other | Attending: Family Medicine | Admitting: Family Medicine

## 2021-11-30 ENCOUNTER — Other Ambulatory Visit: Payer: Self-pay

## 2021-11-30 ENCOUNTER — Encounter (HOSPITAL_COMMUNITY): Payer: Self-pay

## 2021-11-30 DIAGNOSIS — Z9049 Acquired absence of other specified parts of digestive tract: Secondary | ICD-10-CM

## 2021-11-30 DIAGNOSIS — I129 Hypertensive chronic kidney disease with stage 1 through stage 4 chronic kidney disease, or unspecified chronic kidney disease: Secondary | ICD-10-CM | POA: Diagnosis present

## 2021-11-30 DIAGNOSIS — E1122 Type 2 diabetes mellitus with diabetic chronic kidney disease: Secondary | ICD-10-CM | POA: Diagnosis present

## 2021-11-30 DIAGNOSIS — E039 Hypothyroidism, unspecified: Secondary | ICD-10-CM | POA: Diagnosis present

## 2021-11-30 DIAGNOSIS — Z79899 Other long term (current) drug therapy: Secondary | ICD-10-CM

## 2021-11-30 DIAGNOSIS — Z79890 Hormone replacement therapy (postmenopausal): Secondary | ICD-10-CM

## 2021-11-30 DIAGNOSIS — I2510 Atherosclerotic heart disease of native coronary artery without angina pectoris: Secondary | ICD-10-CM | POA: Diagnosis present

## 2021-11-30 DIAGNOSIS — D649 Anemia, unspecified: Secondary | ICD-10-CM

## 2021-11-30 DIAGNOSIS — Z833 Family history of diabetes mellitus: Secondary | ICD-10-CM

## 2021-11-30 DIAGNOSIS — Z90722 Acquired absence of ovaries, bilateral: Secondary | ICD-10-CM

## 2021-11-30 DIAGNOSIS — E119 Type 2 diabetes mellitus without complications: Secondary | ICD-10-CM

## 2021-11-30 DIAGNOSIS — K529 Noninfective gastroenteritis and colitis, unspecified: Secondary | ICD-10-CM | POA: Diagnosis not present

## 2021-11-30 DIAGNOSIS — E785 Hyperlipidemia, unspecified: Secondary | ICD-10-CM | POA: Diagnosis present

## 2021-11-30 DIAGNOSIS — K648 Other hemorrhoids: Secondary | ICD-10-CM | POA: Diagnosis present

## 2021-11-30 DIAGNOSIS — Z885 Allergy status to narcotic agent status: Secondary | ICD-10-CM

## 2021-11-30 DIAGNOSIS — H409 Unspecified glaucoma: Secondary | ICD-10-CM | POA: Diagnosis present

## 2021-11-30 DIAGNOSIS — H9190 Unspecified hearing loss, unspecified ear: Secondary | ICD-10-CM | POA: Diagnosis present

## 2021-11-30 DIAGNOSIS — A09 Infectious gastroenteritis and colitis, unspecified: Principal | ICD-10-CM | POA: Diagnosis present

## 2021-11-30 DIAGNOSIS — I10 Essential (primary) hypertension: Secondary | ICD-10-CM | POA: Diagnosis present

## 2021-11-30 DIAGNOSIS — K219 Gastro-esophageal reflux disease without esophagitis: Secondary | ICD-10-CM | POA: Diagnosis present

## 2021-11-30 DIAGNOSIS — Z801 Family history of malignant neoplasm of trachea, bronchus and lung: Secondary | ICD-10-CM

## 2021-11-30 DIAGNOSIS — K5900 Constipation, unspecified: Secondary | ICD-10-CM | POA: Diagnosis present

## 2021-11-30 DIAGNOSIS — D631 Anemia in chronic kidney disease: Secondary | ICD-10-CM | POA: Diagnosis present

## 2021-11-30 DIAGNOSIS — N1832 Chronic kidney disease, stage 3b: Secondary | ICD-10-CM | POA: Diagnosis present

## 2021-11-30 DIAGNOSIS — Z7984 Long term (current) use of oral hypoglycemic drugs: Secondary | ICD-10-CM

## 2021-11-30 DIAGNOSIS — Z8249 Family history of ischemic heart disease and other diseases of the circulatory system: Secondary | ICD-10-CM

## 2021-11-30 DIAGNOSIS — Z800 Family history of malignant neoplasm of digestive organs: Secondary | ICD-10-CM

## 2021-11-30 DIAGNOSIS — K5732 Diverticulitis of large intestine without perforation or abscess without bleeding: Secondary | ICD-10-CM | POA: Diagnosis present

## 2021-11-30 DIAGNOSIS — Z888 Allergy status to other drugs, medicaments and biological substances status: Secondary | ICD-10-CM

## 2021-11-30 DIAGNOSIS — Z90710 Acquired absence of both cervix and uterus: Secondary | ICD-10-CM

## 2021-11-30 LAB — LIPASE, BLOOD: Lipase: 77 U/L — ABNORMAL HIGH (ref 11–51)

## 2021-11-30 LAB — CBC
HCT: 34.7 % — ABNORMAL LOW (ref 36.0–46.0)
Hemoglobin: 11.3 g/dL — ABNORMAL LOW (ref 12.0–15.0)
MCH: 29.7 pg (ref 26.0–34.0)
MCHC: 32.6 g/dL (ref 30.0–36.0)
MCV: 91.3 fL (ref 80.0–100.0)
Platelets: 283 10*3/uL (ref 150–400)
RBC: 3.8 MIL/uL — ABNORMAL LOW (ref 3.87–5.11)
RDW: 14.5 % (ref 11.5–15.5)
WBC: 10.6 10*3/uL — ABNORMAL HIGH (ref 4.0–10.5)
nRBC: 0 % (ref 0.0–0.2)

## 2021-11-30 LAB — COMPREHENSIVE METABOLIC PANEL
ALT: 16 U/L (ref 0–44)
AST: 17 U/L (ref 15–41)
Albumin: 4 g/dL (ref 3.5–5.0)
Alkaline Phosphatase: 80 U/L (ref 38–126)
Anion gap: 12 (ref 5–15)
BUN: 14 mg/dL (ref 8–23)
CO2: 24 mmol/L (ref 22–32)
Calcium: 10.8 mg/dL — ABNORMAL HIGH (ref 8.9–10.3)
Chloride: 101 mmol/L (ref 98–111)
Creatinine, Ser: 0.96 mg/dL (ref 0.44–1.00)
GFR, Estimated: 56 mL/min — ABNORMAL LOW (ref >60)
Glucose, Bld: 93 mg/dL (ref 70–99)
Potassium: 3.8 mmol/L (ref 3.5–5.1)
Sodium: 137 mmol/L (ref 135–145)
Total Bilirubin: 0.7 mg/dL (ref 0.3–1.2)
Total Protein: 7.7 g/dL (ref 6.5–8.1)

## 2021-11-30 LAB — LACTIC ACID, PLASMA: Lactic Acid, Venous: 0.9 mmol/L (ref 0.5–1.9)

## 2021-11-30 MED ORDER — PIPERACILLIN-TAZOBACTAM 3.375 G IVPB 30 MIN
3.3750 g | Freq: Once | INTRAVENOUS | Status: AC
  Administered 2021-11-30: 3.375 g via INTRAVENOUS
  Filled 2021-11-30: qty 50

## 2021-11-30 MED ORDER — ONDANSETRON HCL 4 MG/2ML IJ SOLN
4.0000 mg | Freq: Once | INTRAMUSCULAR | Status: AC
  Administered 2021-11-30: 4 mg via INTRAVENOUS
  Filled 2021-11-30: qty 2

## 2021-11-30 MED ORDER — IOHEXOL 300 MG/ML  SOLN
100.0000 mL | Freq: Once | INTRAMUSCULAR | Status: AC | PRN
  Administered 2021-11-30: 60 mL via INTRAVENOUS

## 2021-11-30 MED ORDER — SODIUM CHLORIDE 0.9 % IV BOLUS
1000.0000 mL | Freq: Once | INTRAVENOUS | Status: AC
  Administered 2021-11-30: 1000 mL via INTRAVENOUS

## 2021-11-30 MED ORDER — FENTANYL CITRATE PF 50 MCG/ML IJ SOSY
25.0000 ug | PREFILLED_SYRINGE | Freq: Once | INTRAMUSCULAR | Status: AC
  Administered 2021-11-30: 25 ug via INTRAVENOUS
  Filled 2021-11-30: qty 1

## 2021-11-30 MED ORDER — PIPERACILLIN-TAZOBACTAM 3.375 G IVPB
3.3750 g | Freq: Three times a day (TID) | INTRAVENOUS | Status: DC
  Administered 2021-12-01 – 2021-12-03 (×9): 3.375 g via INTRAVENOUS
  Filled 2021-11-30 (×8): qty 50

## 2021-11-30 NOTE — Progress Notes (Signed)
Pharmacy Antibiotic Note  Jade [name] is a 88 y.o. female for which pharmacy has been consulted for zosyn dosing for  intra-abdominal infection .  Estimated Creatinine Clearance: 25.5 mL/min (by C-G formula based on SCr of 0.96 mg/dL).  WBC 10.6; T 97.9 F; 67; RR 16  Plan: Zosyn 3.375g IV q8h (4 hour infusion) Trend WBC, Fever, Renal function, & Clinical course F/u cultures, clinical course, WBC, fever De-escalate when able  Height: '4\' 11"'$  (149.9 cm) Weight: 43.8 kg (96 lb 9 oz) IBW/kg (Calculated) : 43.2  Temp (24hrs), Avg:98.3 F (36.8 C), Min:97.9 F (36.6 C), Max:98.7 F (37.1 C)  Recent Labs  Lab 11/30/21 1752  WBC 10.6*  CREATININE 0.96    Estimated Creatinine Clearance: 25.5 mL/min (by C-G formula based on SCr of 0.96 mg/dL).    Allergies  Allergen Reactions   Codeine    Miralax [Polyethylene Glycol] Swelling    Feet swelling   Pentazocine Nausea And Vomiting    Patient experiences Flashing lights   Pentazocine Lactate    Tramadol Hcl     Antimicrobials this admission: zosyn 8/7 >>   Microbiology results: Pending  Thank you for allowing pharmacy to be a part of this patient's care.  Lorelei Pont, PharmD, BCPS 11/30/2021 11:00 PM ED Clinical Pharmacist -  [phone]

## 2021-11-30 NOTE — ED Provider Notes (Signed)
[facility]ERGENCY DEPT Provider Note   CSN: [id number] Arrival date & time: 11/30/21  1709     History  Chief Complaint  Patient presents with   Abdominal Pain    Jade [name] is a 88 y.o. female.  Left-sided lower abdominal pain for the past month that comes and goes.  Patient concerned this could be diverticulitis.  She has been seen by multiple providers including her PCP and treated with what sounds like metronidazole and finished a course of this.  Cannot say exactly when she finished the course of antibiotics.  She comes in today because she has worsening pain to her left lower quadrant as well as "constipation".  Last bowel movement was 2 days ago and she normally goes every day.  Has not had diarrhea with this.  No nausea or vomiting with this.  No fever.  No pain with urination or blood in the urine.  Has had surgery in the past for diverticulitis.  No chest pain or shortness of breath.  Pain feels similar to previous episodes of diverticulitis.  The history is provided by the patient.  Abdominal Pain      Home Medications Prior to Admission medications   Medication Sig Start Date End Date Taking? Authorizing Provider  acetaminophen (TYLENOL) 500 MG tablet Take by mouth.    [provider]  amLODipine (NORVASC) 2.5 MG tablet Take 2.5 mg by mouth daily.  07/24/19   [provider]  atorvastatin (LIPITOR) 10 MG tablet 1 tablet    [provider]  azelastine (ASTELIN) 0.1 % nasal spray Place 2 sprays into both nostrils 2 (two) times daily. Use in each nostril as directed 08/19/20   Freddi Starr, MD  betamethasone dipropionate 0.05 % lotion SMARTSIG:Sparingly Topical Daily PRN 09/09/20   [provider]  Cephalexin 250 MG tablet SMARTSIG:1 Tablet(s) By Mouth Every 12 Hours 06/19/21   [provider]  Cholecalciferol (VITAMIN D) 1000 UNITS capsule Take 1,000 Units by mouth daily.    [provider]  Coenzyme Q10 (COQ10) 50 MG CAPS Take 1 capsule by mouth daily.    [provider]  fish oil-omega-3 fatty acids 1000 MG capsule Take 1 capsule by mouth daily.    [provider]  FREESTYLE LITE test strip TEST QAM 09/13/14   [provider]  gabapentin (NEURONTIN) 100 MG capsule Take 1 capsule (100 mg total) by mouth 3 (three) times daily as needed (Nerve pain). 04/28/20   Gregor Hams, MD  glucose blood ([employer] TEST STRIPS) test strip Check blood sugar once daily    [provider]  hydrochlorothiazide (MICROZIDE) 12.5 MG capsule Take 1 capsule by mouth daily. 1/2    [provider]  lactobacillus acidophilus (BACID) TABS tablet Take 1 tablet by mouth daily. Probiotic    [provider]  levothyroxine (SYNTHROID) 75 MCG tablet 1 TABLET ONCE A DAY ORALLY 90 DAYS    [provider]  meclizine (ANTIVERT) 25 MG tablet Take 25 mg by mouth 3 (three) times daily as needed for dizziness.    [provider]  Melatonin 3 MG CAPS 1 tablet at bedtime as needed    [provider]  metFORMIN (GLUCOPHAGE) 500 MG tablet Take 1 tablet (500 mg total) by mouth daily with breakfast. 04/28/18   Ghimire, Henreitta Leber, MD  Multiple Vitamins-Minerals (ICAPS AREDS 2 PO) Take 1 tablet by mouth daily.    [provider]  mupirocin ointment (BACTROBAN) 2 %  as needed. nostrils 12/19/18   [provider]  olmesartan (BENICAR) 40 MG tablet Take 1 tablet by mouth daily.    [provider]  pantoprazole (PROTONIX) 40 MG tablet TAKE 1 TABLET(40 MG) BY MOUTH DAILY 07/02/20   Ladene Artist, MD  pantoprazole (PROTONIX) 40 MG tablet 1 tablet 05/18/19   [provider]  sertraline (ZOLOFT) 25 MG tablet Take 25 mg by mouth daily. 06/20/21   [provider]  spironolactone (ALDACTONE) 25 MG tablet TAKE 1 TABLET(25 MG) BY MOUTH DAILY 02/16/21   Patwardhan, Manish J, MD  timolol (TIMOPTIC) 0.5 % ophthalmic  solution Place 1 drop into both eyes 2 (two) times daily.  08/26/14   [provider]  triamcinolone (KENALOG) 0.025 % cream Apply topically 2 (two) times daily. 09/14/21   [provider]      Allergies    Codeine, Miralax [polyethylene glycol], Pentazocine, Pentazocine lactate, and Tramadol hcl    Review of Systems   Review of Systems  Gastrointestinal:  Positive for abdominal pain.    Physical Exam Updated Vital Signs BP (!) 142/76 (BP Location: Right Arm)   Pulse 77   Temp 98.7 F (37.1 C)   Resp 14   Ht '4\' 11"'$  (1.499 m)   Wt 43.8 kg   SpO2 100%   BMI 19.50 kg/m  Physical Exam Vitals and nursing note reviewed.  Constitutional:      General: She is not in acute distress.    Appearance: She is well-developed.  HENT:     Head: Normocephalic and atraumatic.     Mouth/Throat:     Pharynx: No oropharyngeal exudate.  Eyes:     Conjunctiva/sclera: Conjunctivae normal.     Pupils: Pupils are equal, round, and reactive to light.  Neck:     Comments: No meningismus. Cardiovascular:     Rate and Rhythm: Normal rate and regular rhythm.     Heart sounds: Normal heart sounds. No murmur heard. Pulmonary:     Effort: Pulmonary effort is normal. No respiratory distress.     Breath sounds: Normal breath sounds.  Abdominal:     Palpations: Abdomen is soft.     Tenderness: There is abdominal tenderness. There is guarding. There is no rebound.     Comments: Left lower quadrant tenderness with voluntary guarding.  No rebound.  Musculoskeletal:        General: No tenderness. Normal range of motion.     Cervical back: Normal range of motion and neck supple.     Comments: No CVAT  Skin:    General: Skin is warm.  Neurological:     Mental Status: She is alert and oriented to person, place, and time.     Cranial Nerves: No cranial nerve deficit.     Motor: No abnormal muscle tone.     Coordination: Coordination normal.     Comments:  5/5 strength throughout. CN 2-12  intact.Equal grip strength.   Psychiatric:        Behavior: Behavior normal.     ED Results / Procedures / Treatments   Labs (all labs ordered are listed, but only abnormal results are displayed) Labs Reviewed  LIPASE, BLOOD - Abnormal; Notable for the following components:      Result Value   Lipase 77 (*)    All other components within normal limits  COMPREHENSIVE METABOLIC PANEL - Abnormal; Notable for the following components:   Calcium 10.8 (*)    GFR, Estimated 56 (*)  All other components within normal limits  CBC - Abnormal; Notable for the following components:   WBC 10.6 (*)    RBC 3.80 (*)    Hemoglobin 11.3 (*)    HCT 34.7 (*)    All other components within normal limits  LACTIC ACID, PLASMA  URINALYSIS, ROUTINE W REFLEX MICROSCOPIC  LACTIC ACID, PLASMA    EKG None  Radiology CT ABDOMEN PELVIS W CONTRAST  Result Date: 11/30/2021 CLINICAL DATA:  Acute abdominal pain. Left lower quadrant pain. Concern for diverticulitis. EXAM: CT ABDOMEN AND PELVIS WITH CONTRAST TECHNIQUE: Multidetector CT imaging of the abdomen and pelvis was performed using the standard protocol following bolus administration of intravenous contrast. RADIATION DOSE REDUCTION: This exam was performed according to the departmental dose-optimization program which includes automated exposure control, adjustment of the mA and/or kV according to patient size and/or use of iterative reconstruction technique. CONTRAST:  59m OMNIPAQUE IOHEXOL 300 MG/ML  SOLN COMPARISON:  CT 10/27/2020 FINDINGS: Lower chest: Scattered atelectasis. No confluent consolidation or pleural effusion. Hepatobiliary: No focal hepatic abnormality. Chronic intrahepatic biliary ductal dilatation postcholecystectomy. Normal caliber common bile duct. Pancreas: No ductal dilatation or inflammation. Spleen: Normal in size without focal abnormality. Adrenals/Urinary Tract: Normal adrenal glands. No hydronephrosis or perinephric edema.  Homogeneous renal enhancement with symmetric excretion on delayed phase imaging. There is simple bilateral renal cysts, needing no further follow-up. Mild left greater than right renal parenchymal thinning. No renal calculi or solid renal lesion. Urinary bladder is physiologically distended without wall thickening. Stomach/Bowel: There is colonic wall thickening and pericolonic edema involving the long length segment of the colon extending from the splenic flexure through the mid sigmoid colon. There multiple diverticula in this region, however no focally inflamed diverticulitis, and length is more typical of colitis. No bowel pneumatosis or perforation. Additional areas of wall thickening versus nondistention of the proximal transverse colon. There is no small bowel obstruction or inflammation. Unremarkable appearance of the stomach. Vascular/Lymphatic: Advanced aortic atherosclerosis. No aneurysm. No acute vascular findings. Patent portal vein. No abdominopelvic adenopathy. Reproductive: Status post hysterectomy. No adnexal masses. Other: No perforation or free air. No ascites or focal fluid collection. No abdominal wall hernia. Musculoskeletal: Degenerative change in the lumbar spine with multilevel degenerative disc disease and prominent lower lumbar facet hypertrophy. There are no acute or suspicious osseous abnormalities. IMPRESSION: 1. Colitis extending from the splenic flexure through the mid sigmoid colon. There are multiple diverticula in this region, however no focally inflamed diverticulitis, and length is more typical of colitis. No perforation or abscess. 2. Additional areas of wall thickening versus nondistention of the proximal transverse colon. Aortic Atherosclerosis (ICD10-I70.0). Electronically Signed   By: MKeith RakeM.D.   On: 11/30/2021 22:46    Procedures Procedures    Medications Ordered in ED Medications  sodium chloride 0.9 % bolus 1,000 mL (has no administration in time  range)  ondansetron (ZOFRAN) injection 4 mg (has no administration in time range)  fentaNYL (SUBLIMAZE) injection 25 mcg (has no administration in time range)  iohexol (OMNIPAQUE) 300 MG/ML solution 100 mL (60 mLs Intravenous Contrast Given 11/30/21 2226)    ED Course/ Medical Decision Making/ A&P                           Medical Decision Making Amount and/or Complexity of Data Reviewed Labs: ordered. Decision-making details documented in ED Course. Radiology: ordered and independent interpretation performed. Decision-making details documented in ED Course. ECG/medicine tests: ordered and  independent interpretation performed. Decision-making details documented in ED Course.  Risk Prescription drug management. Decision regarding hospitalization.  Side abdominal pain intermittent for the past 1 month concerning for previous episodes of diverticulitis.  Vital stable.  Abdomen soft but tender to left lower quadrant with voluntary guarding  No significant leukocytosis.  CT scan as above shows sigmoid colitis as well as the rectum without diverticulitis.  No abscess  Patient given IV fluids and IV antibiotics.  Suspect likely failure of outpatient antibiotics for colitis and diverticulitis. She is agreeable to overnight admission for observation and further therapies.  Discussed with Dr. Alcario Drought.       Final Clinical Impression(s) / ED Diagnoses Final diagnoses:  None    Rx / DC Orders ED Discharge Orders     None         Walt Geathers, Annie Main, MD 11/30/21 2342

## 2021-11-30 NOTE — ED Triage Notes (Signed)
Patient here POV from Home.  Endorses LLQ Pain that began approximately 1 Month ago. States she has seen Multiple Providers and was sent for Imaging to RO Diverticulitis.   No Known Fevers. No N/V/D. Moderate Constipation. No Dysuria  NAD Noted during Triage. A&Ox4. GCS 15. Ambulatory.

## 2021-11-30 NOTE — ED Notes (Signed)
Patient currently in Imaging; Radiology informed to return to [facility] once completed.

## 2021-12-01 ENCOUNTER — Encounter (HOSPITAL_COMMUNITY): Payer: Self-pay | Admitting: Internal Medicine

## 2021-12-01 DIAGNOSIS — A09 Infectious gastroenteritis and colitis, unspecified: Secondary | ICD-10-CM | POA: Diagnosis present

## 2021-12-01 DIAGNOSIS — K5900 Constipation, unspecified: Secondary | ICD-10-CM | POA: Diagnosis present

## 2021-12-01 DIAGNOSIS — K5730 Diverticulosis of large intestine without perforation or abscess without bleeding: Secondary | ICD-10-CM | POA: Diagnosis not present

## 2021-12-01 DIAGNOSIS — Z90722 Acquired absence of ovaries, bilateral: Secondary | ICD-10-CM | POA: Diagnosis not present

## 2021-12-01 DIAGNOSIS — K529 Noninfective gastroenteritis and colitis, unspecified: Secondary | ICD-10-CM

## 2021-12-01 DIAGNOSIS — K648 Other hemorrhoids: Secondary | ICD-10-CM | POA: Diagnosis present

## 2021-12-01 DIAGNOSIS — Z801 Family history of malignant neoplasm of trachea, bronchus and lung: Secondary | ICD-10-CM | POA: Diagnosis not present

## 2021-12-01 DIAGNOSIS — H409 Unspecified glaucoma: Secondary | ICD-10-CM | POA: Diagnosis present

## 2021-12-01 DIAGNOSIS — Z888 Allergy status to other drugs, medicaments and biological substances status: Secondary | ICD-10-CM | POA: Diagnosis not present

## 2021-12-01 DIAGNOSIS — E119 Type 2 diabetes mellitus without complications: Secondary | ICD-10-CM | POA: Diagnosis not present

## 2021-12-01 DIAGNOSIS — E785 Hyperlipidemia, unspecified: Secondary | ICD-10-CM | POA: Diagnosis present

## 2021-12-01 DIAGNOSIS — Z885 Allergy status to narcotic agent status: Secondary | ICD-10-CM | POA: Diagnosis not present

## 2021-12-01 DIAGNOSIS — N1832 Chronic kidney disease, stage 3b: Secondary | ICD-10-CM | POA: Diagnosis present

## 2021-12-01 DIAGNOSIS — D631 Anemia in chronic kidney disease: Secondary | ICD-10-CM | POA: Diagnosis present

## 2021-12-01 DIAGNOSIS — E1122 Type 2 diabetes mellitus with diabetic chronic kidney disease: Secondary | ICD-10-CM | POA: Diagnosis present

## 2021-12-01 DIAGNOSIS — I129 Hypertensive chronic kidney disease with stage 1 through stage 4 chronic kidney disease, or unspecified chronic kidney disease: Secondary | ICD-10-CM | POA: Diagnosis present

## 2021-12-01 DIAGNOSIS — K219 Gastro-esophageal reflux disease without esophagitis: Secondary | ICD-10-CM | POA: Diagnosis present

## 2021-12-01 DIAGNOSIS — Z7984 Long term (current) use of oral hypoglycemic drugs: Secondary | ICD-10-CM | POA: Diagnosis not present

## 2021-12-01 DIAGNOSIS — Z9049 Acquired absence of other specified parts of digestive tract: Secondary | ICD-10-CM | POA: Diagnosis not present

## 2021-12-01 DIAGNOSIS — I2510 Atherosclerotic heart disease of native coronary artery without angina pectoris: Secondary | ICD-10-CM | POA: Diagnosis present

## 2021-12-01 DIAGNOSIS — Z8249 Family history of ischemic heart disease and other diseases of the circulatory system: Secondary | ICD-10-CM | POA: Diagnosis not present

## 2021-12-01 DIAGNOSIS — E039 Hypothyroidism, unspecified: Secondary | ICD-10-CM | POA: Diagnosis present

## 2021-12-01 DIAGNOSIS — Z800 Family history of malignant neoplasm of digestive organs: Secondary | ICD-10-CM | POA: Diagnosis not present

## 2021-12-01 DIAGNOSIS — Z833 Family history of diabetes mellitus: Secondary | ICD-10-CM | POA: Diagnosis not present

## 2021-12-01 DIAGNOSIS — K5732 Diverticulitis of large intestine without perforation or abscess without bleeding: Secondary | ICD-10-CM | POA: Diagnosis present

## 2021-12-01 DIAGNOSIS — Z90710 Acquired absence of both cervix and uterus: Secondary | ICD-10-CM | POA: Diagnosis not present

## 2021-12-01 DIAGNOSIS — H9190 Unspecified hearing loss, unspecified ear: Secondary | ICD-10-CM | POA: Diagnosis present

## 2021-12-01 LAB — COMPREHENSIVE METABOLIC PANEL
ALT: 18 U/L (ref 0–44)
AST: 18 U/L (ref 15–41)
Albumin: 2.7 g/dL — ABNORMAL LOW (ref 3.5–5.0)
Alkaline Phosphatase: 64 U/L (ref 38–126)
Anion gap: 5 (ref 5–15)
BUN: 10 mg/dL (ref 8–23)
CO2: 24 mmol/L (ref 22–32)
Calcium: 9.6 mg/dL (ref 8.9–10.3)
Chloride: 108 mmol/L (ref 98–111)
Creatinine, Ser: 0.85 mg/dL (ref 0.44–1.00)
GFR, Estimated: >60 mL/min (ref >60)
Glucose, Bld: 147 mg/dL — ABNORMAL HIGH (ref 70–99)
Potassium: 3.5 mmol/L (ref 3.5–5.1)
Sodium: 137 mmol/L (ref 135–145)
Total Bilirubin: 0.8 mg/dL (ref 0.3–1.2)
Total Protein: 5.9 g/dL — ABNORMAL LOW (ref 6.5–8.1)

## 2021-12-01 LAB — CBC WITH DIFFERENTIAL/PLATELET
Abs Immature Granulocytes: 0.02 10*3/uL (ref 0.00–0.07)
Basophils Absolute: 0.1 10*3/uL (ref 0.0–0.1)
Basophils Relative: 1 %
Eosinophils Absolute: 0.5 10*3/uL (ref 0.0–0.5)
Eosinophils Relative: 6 %
HCT: 31.3 % — ABNORMAL LOW (ref 36.0–46.0)
Hemoglobin: 10.5 g/dL — ABNORMAL LOW (ref 12.0–15.0)
Immature Granulocytes: 0 %
Lymphocytes Relative: 21 %
Lymphs Abs: 1.7 10*3/uL (ref 0.7–4.0)
MCH: 31.3 pg (ref 26.0–34.0)
MCHC: 33.5 g/dL (ref 30.0–36.0)
MCV: 93.4 fL (ref 80.0–100.0)
Monocytes Absolute: 0.7 10*3/uL (ref 0.1–1.0)
Monocytes Relative: 8 %
Neutro Abs: 5.3 10*3/uL (ref 1.7–7.7)
Neutrophils Relative %: 64 %
Platelets: 241 10*3/uL (ref 150–400)
RBC: 3.35 MIL/uL — ABNORMAL LOW (ref 3.87–5.11)
RDW: 14.3 % (ref 11.5–15.5)
WBC: 8.2 10*3/uL (ref 4.0–10.5)
nRBC: 0 % (ref 0.0–0.2)

## 2021-12-01 LAB — GLUCOSE, CAPILLARY
Glucose-Capillary: 97 mg/dL (ref 70–99)
Glucose-Capillary: 132 mg/dL — ABNORMAL HIGH (ref 70–99)

## 2021-12-01 LAB — LACTIC ACID, PLASMA
Lactic Acid, Venous: 0.6 mmol/L (ref 0.5–1.9)
Lactic Acid, Venous: 0.7 mmol/L (ref 0.5–1.9)

## 2021-12-01 LAB — SEDIMENTATION RATE: Sed Rate: 35 mm/hr — ABNORMAL HIGH (ref 0–22)

## 2021-12-01 LAB — C-REACTIVE PROTEIN: CRP: 0.6 mg/dL (ref <1.0)

## 2021-12-01 MED ORDER — ATORVASTATIN CALCIUM 10 MG PO TABS
10.0000 mg | ORAL_TABLET | Freq: Every day | ORAL | Status: DC
  Administered 2021-12-01 – 2021-12-04 (×3): 10 mg via ORAL
  Filled 2021-12-01 (×3): qty 1

## 2021-12-01 MED ORDER — ACETAMINOPHEN 325 MG PO TABS: 650.0000 mg | ORAL_TABLET | Freq: Four times a day (QID) | ORAL | Status: DC | PRN

## 2021-12-01 MED ORDER — LEVOTHYROXINE SODIUM 75 MCG PO TABS
75.0000 ug | ORAL_TABLET | Freq: Every day | ORAL | Status: DC
  Administered 2021-12-01 – 2021-12-04 (×4): 75 ug via ORAL
  Filled 2021-12-01 (×4): qty 1

## 2021-12-01 MED ORDER — LACTATED RINGERS IV SOLN: INTRAVENOUS | Status: AC

## 2021-12-01 MED ORDER — SERTRALINE HCL 25 MG PO TABS
25.0000 mg | ORAL_TABLET | Freq: Every day | ORAL | Status: DC
  Filled 2021-12-01: qty 1

## 2021-12-01 MED ORDER — TIMOLOL MALEATE 0.5 % OP SOLN
1.0000 [drp] | Freq: Two times a day (BID) | OPHTHALMIC | Status: AC
Start: 2021-12-01 — End: 2021-12-02
  Administered 2021-12-02 (×2): 1 [drp] via OPHTHALMIC
  Filled 2021-12-01: qty 5

## 2021-12-01 MED ORDER — ACETAMINOPHEN 650 MG RE SUPP: 650.0000 mg | Freq: Four times a day (QID) | RECTAL | Status: DC | PRN

## 2021-12-01 MED ORDER — HEPARIN SODIUM (PORCINE) 5000 UNIT/ML IJ SOLN
5000.0000 [IU] | Freq: Three times a day (TID) | INTRAMUSCULAR | Status: DC
  Administered 2021-12-01 – 2021-12-02 (×3): 5000 [IU] via SUBCUTANEOUS
  Filled 2021-12-01 (×3): qty 1

## 2021-12-01 MED ORDER — AMLODIPINE BESYLATE 2.5 MG PO TABS
2.5000 mg | ORAL_TABLET | Freq: Every day | ORAL | Status: DC
  Administered 2021-12-03 – 2021-12-04 (×2): 2.5 mg via ORAL
  Filled 2021-12-01 (×3): qty 1

## 2021-12-01 MED ORDER — SPIRONOLACTONE 25 MG PO TABS
25.0000 mg | ORAL_TABLET | Freq: Every day | ORAL | Status: DC
  Administered 2021-12-01: 25 mg via ORAL
  Filled 2021-12-01: qty 1

## 2021-12-01 MED ORDER — INSULIN ASPART 100 UNIT/ML IJ SOLN
0.0000 [IU] | Freq: Three times a day (TID) | INTRAMUSCULAR | Status: DC
  Administered 2021-12-02 – 2021-12-03 (×2): 1 [IU] via SUBCUTANEOUS

## 2021-12-01 MED ORDER — IRBESARTAN 300 MG PO TABS
300.0000 mg | ORAL_TABLET | Freq: Every day | ORAL | Status: DC
  Administered 2021-12-01: 300 mg via ORAL
  Filled 2021-12-01: qty 1

## 2021-12-01 NOTE — Plan of Care (Signed)

## 2021-12-01 NOTE — Progress Notes (Addendum)
Pt doesn't have code status, but stated that she wants to be a full code.   Pt arrived via EMS w/o complaints. Pt would prefer a female do peri care. Pt is ready for night meds. Purewick applied by CNA

## 2021-12-01 NOTE — Progress Notes (Signed)
   Patient seen and examined at bedside, patient admitted after midnight, please see earlier detailed admission note by Rise Patience, MD. Briefly, patient presented secondary to abdominal pain and was found to have evidence of colitis on CT imaging. GI consulted. Empiric antibiotics initiated.  Subjective: Patient reports abdominal pain has actually improved today after she had some food.  BP (!) 115/57 (BP Location: Left Arm)   Pulse 64   Temp 97.6 F (36.4 C)   Resp 16   Ht '4\' 11"'$  (1.499 m)   Wt 43.8 kg   SpO2 97%   BMI 19.50 kg/m   General exam: Appears calm and comfortable Respiratory system: Clear to auscultation. Respiratory effort normal. Cardiovascular system: S1 & S2 heard, RRR. Gastrointestinal system: Abdomen is soft and nontender. Normal bowel sounds heard. Central nervous system: Alert and oriented. No focal neurological deficits. Musculoskeletal: No calf tenderness Skin: No cyanosis. No rashes Psychiatry: Judgement and insight appear normal. Mood & affect appropriate.   Brief assessment/Plan:  Colitis Concern for ischemic vs infectious colitis. GI consulted with initial recommendations to minimize hypotension and are considering possible vascular surgery consult vs flex sig/colonoscopy -Continue empiric Zosyn IV -Follow-up blood cultures  Hypertension -Discontinue irbesartan and spironolactone for now -Continue amlodipine  Diabetes mellitus, type 2 Anemia Hyperlipidemia No changes to plan in H&P  Family communication: None at bedside DVT prophylaxis: Heparin subq Disposition: Discharge pending specialist recommendations/management  Cordelia Poche, MD [facility] 12/01/2021, 12:50 PM

## 2021-12-01 NOTE — H&P (Signed)
History and Physical    JACQUE BYRON WUJ:[id number] DOB: [birth year] DOA: 11/30/2021  PCP: Deland Pretty, MD  Patient coming from: Home.  Chief Complaint: Abdominal pain.  HPI: Jade [name] is a 88 y.o. female with history of diabetes mellitus type 2, hypertension, hypothyroidism, hyperlipidemia, CAD has been experiencing left lower quadrant pain for almost a month which patient states comes and goes.  Was recently placed on antibiotics by patient's primary care physician.  Patient states while she was taking antibiotics her pain had improved but has come back again.  Denies any diarrhea but patient states she actually has constipation.  No vomiting fever or chills.  Did not have any blood in the stools.  ED Course: In the ER patient had CT abdomen pelvis which shows features concerning for colitis.  Lactic acid was normal WBC was 10.6.  Patient was started on Zosyn and admitted for further work-up.  Was also given fluid bolus.  Review of Systems: As per HPI, rest all negative.   Past Medical History:  Diagnosis Date   Allergic rhinitis    Bronchitis    Cataract    CKD (chronic kidney disease)    stage 3 kidney failure per pt   Diabetes mellitus    Diverticulosis 11/2010   GERD (gastroesophageal reflux disease)    Glaucoma    Glaucoma    Hyperlipidemia    Hypertension    Hypothyroid    Internal hemorrhoids    Tubular adenoma polyp of rectum 02/2002   Type 2 diabetes mellitus ([place])     Past Surgical History:  Procedure Laterality Date   APPENDECTOMY     BREAST CYST EXCISION     CHOLECYSTECTOMY     COLONOSCOPY     DIVERTICULITIS  2004   SURGERY FOR DIVERTICULITIS PER PT.   HERNIA REPAIR     w/ mesh   POLYPECTOMY     TONSILLECTOMY     TOTAL ABDOMINAL HYSTERECTOMY W/ BILATERAL SALPINGOOPHORECTOMY       reports that she has never smoked. She has never used smokeless tobacco. She reports that she does not drink alcohol and does not use drugs.  Allergies   Allergen Reactions   Codeine    Miralax [Polyethylene Glycol] Swelling    Feet swelling   Pentazocine Nausea And Vomiting    Patient experiences Flashing lights   Pentazocine Lactate    Tramadol Hcl     Family History  Problem Relation Age of Onset   Lung cancer Father    Esophageal cancer Father    Heart disease Mother    Heart disease Brother    Diabetes Maternal Aunt    Diabetes Maternal Uncle    Diabetes Brother    Colon cancer Neg Hx    Rectal cancer Neg Hx    Stomach cancer Neg Hx     Prior to Admission medications   Medication Sig Start Date End Date Taking? Authorizing Provider  acetaminophen (TYLENOL) 500 MG tablet Take by mouth.    [provider]  amLODipine (NORVASC) 2.5 MG tablet Take 2.5 mg by mouth daily.  07/24/19   [provider]  atorvastatin (LIPITOR) 10 MG tablet 1 tablet    [provider]  azelastine (ASTELIN) 0.1 % nasal spray Place 2 sprays into both nostrils 2 (two) times daily. Use in each nostril as directed 08/19/20   Freddi Starr, MD  betamethasone dipropionate 0.05 % lotion SMARTSIG:Sparingly Topical Daily PRN 09/09/20   [provider]  Cephalexin  250 MG tablet SMARTSIG:1 Tablet(s) By Mouth Every 12 Hours 06/19/21   [provider]  Cholecalciferol (VITAMIN D) 1000 UNITS capsule Take 1,000 Units by mouth daily.    [provider]  Coenzyme Q10 (COQ10) 50 MG CAPS Take 1 capsule by mouth daily.    [provider]  fish oil-omega-3 fatty acids 1000 MG capsule Take 1 capsule by mouth daily.    [provider]  FREESTYLE LITE test strip TEST QAM 09/13/14   [provider]  gabapentin (NEURONTIN) 100 MG capsule Take 1 capsule (100 mg total) by mouth 3 (three) times daily as needed (Nerve pain). 04/28/20   Gregor Hams, MD  glucose blood ([employer] TEST STRIPS) test strip Check blood sugar once daily    [provider]  hydrochlorothiazide (MICROZIDE) 12.5 MG capsule  Take 1 capsule by mouth daily. 1/2    [provider]  lactobacillus acidophilus (BACID) TABS tablet Take 1 tablet by mouth daily. Probiotic    [provider]  levothyroxine (SYNTHROID) 75 MCG tablet 1 TABLET ONCE A DAY ORALLY 90 DAYS    [provider]  meclizine (ANTIVERT) 25 MG tablet Take 25 mg by mouth 3 (three) times daily as needed for dizziness.    [provider]  Melatonin 3 MG CAPS 1 tablet at bedtime as needed    [provider]  metFORMIN (GLUCOPHAGE) 500 MG tablet Take 1 tablet (500 mg total) by mouth daily with breakfast. 04/28/18   Ghimire, Henreitta Leber, MD  Multiple Vitamins-Minerals (ICAPS AREDS 2 PO) Take 1 tablet by mouth daily.    [provider]  mupirocin ointment (BACTROBAN) 2 % as needed. nostrils 12/19/18   [provider]  olmesartan (BENICAR) 40 MG tablet Take 1 tablet by mouth daily.    [provider]  pantoprazole (PROTONIX) 40 MG tablet TAKE 1 TABLET(40 MG) BY MOUTH DAILY 07/02/20   Ladene Artist, MD  pantoprazole (PROTONIX) 40 MG tablet 1 tablet 05/18/19   [provider]  sertraline (ZOLOFT) 25 MG tablet Take 25 mg by mouth daily. 06/20/21   [provider]  spironolactone (ALDACTONE) 25 MG tablet TAKE 1 TABLET(25 MG) BY MOUTH DAILY 02/16/21   Patwardhan, Manish J, MD  timolol (TIMOPTIC) 0.5 % ophthalmic solution Place 1 drop into both eyes 2 (two) times daily.  08/26/14   [provider]  triamcinolone (KENALOG) 0.025 % cream Apply topically 2 (two) times daily. 09/14/21   [provider]    Physical Exam: Constitutional: Moderately built and nourished. Vitals:   11/30/21 2250 12/01/21 0130 12/01/21 0308 12/01/21 0427  BP:  139/73 137/77 (!) 113/53  Pulse:  81 78 61  Resp:  '17 16 16  '$ Temp: 97.9 F (36.6 C)  98.5 F (36.9 C)   TempSrc: Oral     SpO2:  95% 100% 97%  Weight:      Height:       Eyes: Anicteric no pallor. ENMT: No discharge from the ears  eyes nose and mouth. Neck: No mass felt.  No neck rigidity. Respiratory: No rhonchi or crepitations. Cardiovascular: S1-S2 heard. Abdomen: Nontender bowel sound present.  No guarding or rigidity. Musculoskeletal: No edema. Skin: No rash. Neurologic: Alert awake oriented to time place and person.  Moves all extremities. Psychiatric: Appears normal.  Normal affect.   Labs on Admission: I have personally reviewed following labs and imaging studies  CBC: Recent Labs  Lab 11/30/21 1752  WBC 10.6*  HGB 11.3*  HCT  34.7*  MCV 91.3  PLT 016   Basic Metabolic Panel: Recent Labs  Lab 11/30/21 1752  NA 137  K 3.8  CL 101  CO2 24  GLUCOSE 93  BUN 14  CREATININE 0.96  CALCIUM 10.8*   GFR: Estimated Creatinine Clearance: 25.5 mL/min (by C-G formula based on SCr of 0.96 mg/dL). Liver Function Tests: Recent Labs  Lab 11/30/21 1752  AST 17  ALT 16  ALKPHOS 80  BILITOT 0.7  PROT 7.7  ALBUMIN 4.0   Recent Labs  Lab 11/30/21 1752  LIPASE 77*   No results for input(s): "AMMONIA" in the last 168 hours. Coagulation Profile: No results for input(s): "INR", "PROTIME" in the last 168 hours. Cardiac Enzymes: No results for input(s): "CKTOTAL", "CKMB", "CKMBINDEX", "TROPONINI" in the last 168 hours. BNP (last 3 results) No results for input(s): "PROBNP" in the last 8760 hours. HbA1C: No results for input(s): "HGBA1C" in the last 72 hours. CBG: No results for input(s): "GLUCAP" in the last 168 hours. Lipid Profile: No results for input(s): "CHOL", "HDL", "LDLCALC", "TRIG", "CHOLHDL", "LDLDIRECT" in the last 72 hours. Thyroid Function Tests: No results for input(s): "TSH", "T4TOTAL", "FREET4", "T3FREE", "THYROIDAB" in the last 72 hours. Anemia Panel: No results for input(s): "VITAMINB12", "FOLATE", "FERRITIN", "TIBC", "IRON", "RETICCTPCT" in the last 72 hours. Urine analysis:    Component Value Date/Time   COLORURINE YELLOW 10/27/2020 1705   APPEARANCEUR CLOUDY (A)  10/27/2020 1705   LABSPEC 1.009 10/27/2020 1705   PHURINE 5.0 10/27/2020 1705   GLUCOSEU NEGATIVE 10/27/2020 1705   HGBUR NEGATIVE 10/27/2020 1705   BILIRUBINUR NEGATIVE 10/27/2020 1705   BILIRUBINUR negative 09/26/2017 1758   KETONESUR NEGATIVE 10/27/2020 1705   PROTEINUR NEGATIVE 10/27/2020 1705   UROBILINOGEN 0.2 08/27/2019 1935   NITRITE NEGATIVE 10/27/2020 1705   LEUKOCYTESUR LARGE (A) 10/27/2020 1705   Sepsis Labs: '@LABRCNTIP'$ (procalcitonin:4,lacticidven:4) )No results found for this or any previous visit (from the past 240 hour(s)).   Radiological Exams on Admission: CT ABDOMEN PELVIS W CONTRAST  Result Date: 11/30/2021 CLINICAL DATA:  Acute abdominal pain. Left lower quadrant pain. Concern for diverticulitis. EXAM: CT ABDOMEN AND PELVIS WITH CONTRAST TECHNIQUE: Multidetector CT imaging of the abdomen and pelvis was performed using the standard protocol following bolus administration of intravenous contrast. RADIATION DOSE REDUCTION: This exam was performed according to the departmental dose-optimization program which includes automated exposure control, adjustment of the mA and/or kV according to patient size and/or use of iterative reconstruction technique. CONTRAST:  20m OMNIPAQUE IOHEXOL 300 MG/ML  SOLN COMPARISON:  CT 10/27/2020 FINDINGS: Lower chest: Scattered atelectasis. No confluent consolidation or pleural effusion. Hepatobiliary: No focal hepatic abnormality. Chronic intrahepatic biliary ductal dilatation postcholecystectomy. Normal caliber common bile duct. Pancreas: No ductal dilatation or inflammation. Spleen: Normal in size without focal abnormality. Adrenals/Urinary Tract: Normal adrenal glands. No hydronephrosis or perinephric edema. Homogeneous renal enhancement with symmetric excretion on delayed phase imaging. There is simple bilateral renal cysts, needing no further follow-up. Mild left greater than right renal parenchymal thinning. No renal calculi or solid renal  lesion. Urinary bladder is physiologically distended without wall thickening. Stomach/Bowel: There is colonic wall thickening and pericolonic edema involving the long length segment of the colon extending from the splenic flexure through the mid sigmoid colon. There multiple diverticula in this region, however no focally inflamed diverticulitis, and length is more typical of colitis. No bowel pneumatosis or perforation. Additional areas of wall thickening versus nondistention of the proximal transverse colon. There is no small bowel obstruction or inflammation. Unremarkable  appearance of the stomach. Vascular/Lymphatic: Advanced aortic atherosclerosis. No aneurysm. No acute vascular findings. Patent portal vein. No abdominopelvic adenopathy. Reproductive: Status post hysterectomy. No adnexal masses. Other: No perforation or free air. No ascites or focal fluid collection. No abdominal wall hernia. Musculoskeletal: Degenerative change in the lumbar spine with multilevel degenerative disc disease and prominent lower lumbar facet hypertrophy. There are no acute or suspicious osseous abnormalities. IMPRESSION: 1. Colitis extending from the splenic flexure through the mid sigmoid colon. There are multiple diverticula in this region, however no focally inflamed diverticulitis, and length is more typical of colitis. No perforation or abscess. 2. Additional areas of wall thickening versus nondistention of the proximal transverse colon. Aortic Atherosclerosis (ICD10-I70.0). Electronically Signed   By: Keith Rake M.D.   On: 11/30/2021 22:46      Assessment/Plan Principal Problem:   Colitis presumed infectious Active Problems:   Hypothyroidism   DM2 (diabetes mellitus, type 2) ([place])   Essential hypertension   Colitis    Colitis -has segmental colitis.  Concerning for ischemic but lactic acid is normal.  Treating now for possible infectious colitis.  Consult GI in the morning if pain returns.  Repeating  lactic and CBC.  Continue with gentle hydration antibiotics.  Placing patient on clear liquids. Hypertension we will continue losartan and amlodipine.  Holding hydrochlorothiazide since patient is receiving fluids. Diabetes mellitus type 2 we will keep patient on sliding scale coverage. Anemia follow CBC. Hyperlipidemia on statins.   DVT prophylaxis: Heparin. Code Status: Full code confirmed with patient. Family Communication: Discussed with patient. Disposition Plan: Home. Consults called: We will consult [facility]. Admission status: Observation.   Rise Patience MD [facility] Pager [phone].  If 7PM-7AM, please contact night-coverage [email or web address] Password [email or web address]  12/01/2021, 5:14 AM

## 2021-12-01 NOTE — Consult Note (Signed)
[facility] Consult: 9:15 AM 12/01/2021  LOS: 0 days    Referring Provider: Dr Hal Hope  Primary Care Physician:  Deland Pretty, MD, do not have epic access to his records. Primary Gastroenterologist:  Dr. Fuller Plan    Reason for Consultation:  segmental colitis   HPI: Jade [name] is a 88 y.o. female.  PMH HEARING LOSS.  CAD on CT imaging, no interventions or MI.  Aortic atherosclerosis by CT imaging, no interventions or symptoms.  Hypothyroidism.  Glaucoma.  CKD 3.  NIDDM.  HLD.  HTN.  2016 Echo: EF 60 to 65%, No RV pathology, no signif valve dz.  1978 hysterectomy, colporrhaphy, cystouretopexy "for pelvic support problems". 2005 L oophorectomy, salpingectomy for benign ovarian cystadenofibroma.   Other Surgeries: appendectomy, cholecystectomy, hernia repair with mesh.  GERD, TA colon polyps, diverticulitis (2020).  Patient says she had colon surgery for diverticulitis but the 2005 Gyn operative note describes lysis of adhesions from area of recent diverticulitis, no bowel injury or bowel resection.  It appears that she may have misunderstood the specifics of that surgery. 2003 EGD.  Antral erythema.  Pathology : mild chronic gastritis, no H pylori. 2003 Colonoscopy: Nonbleeding internal hemorrhoids.  Sigmoid diverticulosis.  Small cecal polyp (TA).    2007 colonoscopy with internal hemorrhoid, otherwise normal study 2012 Colonoscopy:  7 mm polyp (TA) at hepatic flexure.  Moderate sigmoid to descending diverticulosis.  Hemorrhoids.  Per Dr Fuller Plan, pt aged out of repeat surveillance colonoscopy.  07/2019 EGD Mild reactive gastritis with erosions.  No H. pylori, metaplasia, dysplasia malignancy    For about a month experiencing waxing, waning LLQ pain w some constipation.  Pain reminiscent of previous  diverticulitis.  Normally has daily bowel movements but recently experiencing constipation.  Its been about a week since she last had a bowel movement.  A couple of times she is seen pink-tinged rectal mucus but no overt hematochezia or bloody stool.  No diarrhea, nausea, vomiting, fever.  Appetite and food consumption not compromised.  Food does not exacerbate the problem.  Concern was diverticulitis.  Empiric course of metronidazole as well as 1 other course of antibiotics prescribed initially for UTI (was experiencing dysuria which resolved) then for diverticulitis provided some relief but did not resolve the problem.  Provider ordered CT scan revealing segmental left-sided colitis.  She was forwarded to the ER for admission.  Zosyn initiated.  Currently pain-free.  No shortness of breath.  BPs 140s-113/70s-50s.  No Tachycardia, fever, hypoxia.   Hgb 11.3 ...10.5, MCV 93.  WBC, platelets normal.   T bili, alk phos, transaminases normal.  Lipase 77 (range 11- 51).  Normal lactate. CTAP w contrast: colitis from splenic flex to mid sigmoid, this area also contains numerous tics but no diverticulitis.  Colonic thickening vs underdistention in prox transverse.  Advanced aortic vasc dz w/O aneurysm.  PV patent.  Incidental spinal disc dz.    No ETOH, tobacco.  Widowed.  2 sons, one in [place] the other in [place].  4 grandchildren, 6 great-grandchildren.  Widowed.  Resides independently in [place].  Still driving and just renewed her driver's license.  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 No family history colorectal or digestive organ disease.    Past Medical History:  Diagnosis Date   Allergic rhinitis    Bronchitis    Cataract    CKD (chronic kidney disease)    stage 3 kidney failure per pt   Diabetes mellitus    Diverticulosis 11/2010   GERD (gastroesophageal reflux disease)    Glaucoma    Hyperlipidemia    Hypertension    Hypothyroid    Internal hemorrhoids    Tubular  adenoma polyp of rectum 02/2002   Type 2 diabetes mellitus ([place])     Past Surgical History:  Procedure Laterality Date   APPENDECTOMY     BREAST CYST EXCISION     CHOLECYSTECTOMY     COLONOSCOPY     DIVERTICULITIS  2004   SURGERY FOR DIVERTICULITIS PER PT.   HERNIA REPAIR     w/ mesh   POLYPECTOMY     TONSILLECTOMY     TOTAL ABDOMINAL HYSTERECTOMY W/ BILATERAL SALPINGOOPHORECTOMY      Prior to Admission medications   Medication Sig Start Date End Date Taking? Authorizing Provider  acetaminophen (TYLENOL) 500 MG tablet Take by mouth.    [provider]  amLODipine (NORVASC) 2.5 MG tablet Take 2.5 mg by mouth daily.  07/24/19   [provider]  atorvastatin (LIPITOR) 10 MG tablet 1 tablet    [provider]  azelastine (ASTELIN) 0.1 % nasal spray Place 2 sprays into both nostrils 2 (two) times daily. Use in each nostril as directed 08/19/20   Freddi Starr, MD  betamethasone dipropionate 0.05 % lotion SMARTSIG:Sparingly Topical Daily PRN 09/09/20   [provider]  Cephalexin 250 MG tablet SMARTSIG:1 Tablet(s) By Mouth Every 12 Hours 06/19/21   [provider]  Cholecalciferol (VITAMIN D) 1000 UNITS capsule Take 1,000 Units by mouth daily.    [provider]  Coenzyme Q10 (COQ10) 50 MG CAPS Take 1 capsule by mouth daily.    [provider]  fish oil-omega-3 fatty acids 1000 MG capsule Take 1 capsule by mouth daily.    [provider]  FREESTYLE LITE test strip TEST QAM 09/13/14   [provider]  gabapentin (NEURONTIN) 100 MG capsule Take 1 capsule (100 mg total) by mouth 3 (three) times daily as needed (Nerve pain). 04/28/20   Gregor Hams, MD  glucose blood ([employer] TEST STRIPS) test strip Check blood sugar once daily    [provider]  hydrochlorothiazide (MICROZIDE) 12.5 MG capsule Take 1 capsule by mouth daily. 1/2    [provider]  lactobacillus acidophilus (BACID) TABS tablet  Take 1 tablet by mouth daily. Probiotic    [provider]  levothyroxine (SYNTHROID) 75 MCG tablet 1 TABLET ONCE A DAY ORALLY 90 DAYS    [provider]  meclizine (ANTIVERT) 25 MG tablet Take 25 mg by mouth 3 (three) times daily as needed for dizziness.    [provider]  Melatonin 3 MG CAPS 1 tablet at bedtime as needed    [provider]  metFORMIN (GLUCOPHAGE) 500 MG tablet Take 1 tablet (500 mg total) by mouth daily with breakfast. 04/28/18   Ghimire, Henreitta Leber, MD  Multiple Vitamins-Minerals (ICAPS AREDS 2 PO) Take 1 tablet by mouth daily.    [provider]  mupirocin ointment (BACTROBAN) 2 % as needed. nostrils 12/19/18   [provider]  olmesartan [facility])  40 MG tablet Take 1 tablet by mouth daily.    [provider]  pantoprazole (PROTONIX) 40 MG tablet TAKE 1 TABLET(40 MG) BY MOUTH DAILY 07/02/20   Ladene Artist, MD  pantoprazole (PROTONIX) 40 MG tablet 1 tablet 05/18/19   [provider]  sertraline (ZOLOFT) 25 MG tablet Take 25 mg by mouth daily. 06/20/21   [provider]  spironolactone (ALDACTONE) 25 MG tablet TAKE 1 TABLET(25 MG) BY MOUTH DAILY 02/16/21   Patwardhan, Manish J, MD  timolol (TIMOPTIC) 0.5 % ophthalmic solution Place 1 drop into both eyes 2 (two) times daily.  08/26/14   [provider]  triamcinolone (KENALOG) 0.025 % cream Apply topically 2 (two) times daily. 09/14/21   [provider]    Scheduled Meds:  amLODipine  2.5 mg Oral Daily   atorvastatin  10 mg Oral Daily   heparin  5,000 Units Subcutaneous Q8H   insulin aspart  0-6 Units Subcutaneous TID WC   irbesartan  300 mg Oral Daily   levothyroxine  75 mcg Oral Q0600   sertraline  25 mg Oral Daily   spironolactone  25 mg Oral Daily   Infusions:  lactated ringers     piperacillin-tazobactam (ZOSYN)  IV 3.375 g (12/01/21 0605)   PRN Meds: acetaminophen **OR** acetaminophen   Allergies as of 11/30/2021 -  Review Complete 11/30/2021  Allergen Reaction Noted   Codeine     Miralax [polyethylene glycol] Swelling 11/06/2014   Pentazocine Nausea And Vomiting 02/19/2019   Pentazocine lactate     Tramadol hcl      Family History  Problem Relation Age of Onset   Lung cancer Father    Esophageal cancer Father    Heart disease Mother    Heart disease Brother    Diabetes Maternal Aunt    Diabetes Maternal Uncle    Diabetes Brother    Colon cancer Neg Hx    Rectal cancer Neg Hx    Stomach cancer Neg Hx     Social History   Socioeconomic History   Marital status: Widowed    Spouse name: Not on file   Number of children: 2   Years of education: Not on file   Highest education level: Not on file  Occupational History   Not on file  Tobacco Use   Smoking status: Never   Smokeless tobacco: Never  Vaping Use   Vaping Use: Never used  Substance and Sexual Activity   Alcohol use: No   Drug use: No   Sexual activity: Not on file  Other Topics Concern   Not on file  Social History Narrative   Not on file   Social Determinants of Health   Financial Resource Strain: Not on file  Food Insecurity: Not on file  Transportation Needs: Not on file  Physical Activity: Not on file  Stress: Not on file  Social Connections: Not on file  Intimate Partner Violence: Not on file    REVIEW OF SYSTEMS: Constitutional: No weakness, no fatigue. ENT:  No nose bleeds Pulm: No cough or dyspnea. CV:  No palpitations, no LE edema.  No chest pain. GU:  No hematuria, no frequency GI: See HPI. Heme: No unusual or excessive bleeding/bruising. Transfusions: None. Neuro:  No headaches, no peripheral tingling or numbness.  No dizziness.  No syncope.  No seizures. Derm:  No itching, no rash or sores.  Endocrine:  No sweats or chills.  No polyuria or dysuria Immunization: Reviewed, not queried. Travel:  None beyond local counties in last few months.    PHYSICAL EXAM: Vital signs in last 24  hours: Vitals:   12/01/21 0427 12/01/21 0807  BP: (!) 113/53 (!) 115/57  Pulse: 61 64  Resp: 16 16  Temp:  97.6 F (36.4 C)  SpO2: 97% 97%   Wt Readings from Last 3 Encounters:  11/30/21 43.8 kg  11/12/21 43.8 kg  12/24/20 44 kg    General: Delightful.  Pleasant, well-appearing, comfortable.  Alert and able to provide great information. Head: No facial asymmetry or swelling.  No signs of head trauma. Eyes: No conjunctival pallor.  EOMI. Ears: Mild to moderately hard of hearing which can be overcome if you speak clearly, a bit loud and close to the patient.  She left her hearing aids at home for fear that he got lost Nose: No congestion or discharge Mouth: Some missing teeth but remaining teeth in good repair.  Tongue midline.  Mucosa is moist, pink, clear. Neck: No JVD, no thyromegaly, no masses Lungs: Some dry crackles at the bases more prominent on the right.  Otherwise excellent breath sounds.  No labored breathing or cough Heart: RRR.  No MRG.  S1, S2 present. Abdomen: Soft, not tender, not distended.  No HSM, masses, bruits, hernias..   Rectal: Deferred Musc/Skeltl: No joint redness, swelling or gross deformity. Extremities: No CCE.  Feet are warm. Neurologic: Oriented x3.  Moves all 4 limbs without tremor.  Did not formally test strength but no gross deficits. Skin: No rash, no sores, no suspicious lesions, no telangiectasia. Nodes: No cervical or inguinal adenopathy. Psych: Pleasant, calm, fluid speech.    Intake/Output from previous day: 08/07 0701 - 08/08 0700 In: 1058.4 [IV Piggyback:1058.4] Out: -  Intake/Output this shift: No intake/output data recorded.  LAB RESULTS: Recent Labs    11/30/21 1752 12/01/21 0421  WBC 10.6* 8.2  HGB 11.3* 10.5*  HCT 34.7* 31.3*  PLT 283 241   BMET Lab Results  Component Value Date   NA 137 12/01/2021   NA 137 11/30/2021   NA 137 10/27/2020   K 3.5 12/01/2021   K 3.8 11/30/2021   K 4.4 10/27/2020   CL 108 12/01/2021    CL 101 11/30/2021   CL 103 10/27/2020   CO2 24 12/01/2021   CO2 24 11/30/2021   CO2 25 10/27/2020   GLUCOSE 147 (H) 12/01/2021   GLUCOSE 93 11/30/2021   GLUCOSE 118 (H) 10/27/2020   BUN 10 12/01/2021   BUN 14 11/30/2021   BUN 37 (H) 10/27/2020   CREATININE 0.85 12/01/2021   CREATININE 0.96 11/30/2021   CREATININE 1.12 (H) 10/27/2020   CALCIUM 9.6 12/01/2021   CALCIUM 10.8 (H) 11/30/2021   CALCIUM 10.5 (H) 10/27/2020   LFT Recent Labs    11/30/21 1752 12/01/21 0421  PROT 7.7 5.9*  ALBUMIN 4.0 2.7*  AST 17 18  ALT 16 18  ALKPHOS 80 64  BILITOT 0.7 0.8   PT/INR No results found for: "INR", "PROTIME" Hepatitis Panel No results for input(s): "HEPBSAG", "HCVAB", "HEPAIGM", "HEPBIGM" in the last 72 hours. C-Diff No components found for: "CDIFF" Lipase     Component Value Date/Time   LIPASE 77 (H) 11/30/2021 1752    Drugs of Abuse  No results found for: "LABOPIA", "COCAINSCRNUR", "LABBENZ", "AMPHETMU", "THCU", "LABBARB"   RADIOLOGY STUDIES: CT ABDOMEN PELVIS W CONTRAST  Result Date: 11/30/2021 CLINICAL DATA:  Acute abdominal pain. Left lower quadrant pain. Concern for diverticulitis. EXAM: CT ABDOMEN AND PELVIS WITH CONTRAST  TECHNIQUE: Multidetector CT imaging of the abdomen and pelvis was performed using the standard protocol following bolus administration of intravenous contrast. RADIATION DOSE REDUCTION: This exam was performed according to the departmental dose-optimization program which includes automated exposure control, adjustment of the mA and/or kV according to patient size and/or use of iterative reconstruction technique. CONTRAST:  5m OMNIPAQUE IOHEXOL 300 MG/ML  SOLN COMPARISON:  CT 10/27/2020 FINDINGS: Lower chest: Scattered atelectasis. No confluent consolidation or pleural effusion. Hepatobiliary: No focal hepatic abnormality. Chronic intrahepatic biliary ductal dilatation postcholecystectomy. Normal caliber common bile duct. Pancreas: No ductal dilatation  or inflammation. Spleen: Normal in size without focal abnormality. Adrenals/Urinary Tract: Normal adrenal glands. No hydronephrosis or perinephric edema. Homogeneous renal enhancement with symmetric excretion on delayed phase imaging. There is simple bilateral renal cysts, needing no further follow-up. Mild left greater than right renal parenchymal thinning. No renal calculi or solid renal lesion. Urinary bladder is physiologically distended without wall thickening. Stomach/Bowel: There is colonic wall thickening and pericolonic edema involving the long length segment of the colon extending from the splenic flexure through the mid sigmoid colon. There multiple diverticula in this region, however no focally inflamed diverticulitis, and length is more typical of colitis. No bowel pneumatosis or perforation. Additional areas of wall thickening versus nondistention of the proximal transverse colon. There is no small bowel obstruction or inflammation. Unremarkable appearance of the stomach. Vascular/Lymphatic: Advanced aortic atherosclerosis. No aneurysm. No acute vascular findings. Patent portal vein. No abdominopelvic adenopathy. Reproductive: Status post hysterectomy. No adnexal masses. Other: No perforation or free air. No ascites or focal fluid collection. No abdominal wall hernia. Musculoskeletal: Degenerative change in the lumbar spine with multilevel degenerative disc disease and prominent lower lumbar facet hypertrophy. There are no acute or suspicious osseous abnormalities. IMPRESSION: 1. Colitis extending from the splenic flexure through the mid sigmoid colon. There are multiple diverticula in this region, however no focally inflamed diverticulitis, and length is more typical of colitis. No perforation or abscess. 2. Additional areas of wall thickening versus nondistention of the proximal transverse colon. Aortic Atherosclerosis (ICD10-I70.0). Electronically Signed   By: MKeith RakeM.D.   On: 11/30/2021  22:46      IMPRESSION:     Segmental colitis suspicious for ischemic colitis.  CT demonstrates nonaneurysmal aortic vascular disease.  Symptoms of intermittent left lower quadrant abdominal pain persisted after a couple of courses of antibiotics, 1 of which was metronidazole.  Based on CT imaging this does not appear to be diverticulitis.  WBCs are normal, no fever, no features c/w sepsis.  Day 2 Zosyn..Marland Kitchen History recurrent TA polyps.  Aged out of surveillance after latest colonoscopy 2012.    Mildly elevated lipase.  No pancreatic, hepatic, biliary pathology on CT.    PLAN:     May need to consider backing off on antihypertensives to allow for maximum colonic blood flow.    ? Role of formal angiography?  ? Need for vascular surgeon's input?  ? Need for flex sig vs colonoscopy?  Currently on clear liquids.  Could probably advance her diet but will discuss with Dr DLorenso Courier  Pt has not had meal associated worsening of abd pain.     SAzucena Freed 12/01/2021, 9:15 AM Phone [phone]

## 2021-12-01 NOTE — Plan of Care (Signed)

## 2021-12-01 NOTE — Plan of Care (Signed)

## 2021-12-01 NOTE — H&P (View-Only) (Signed)
[facility] Consult: 9:15 AM 12/01/2021  LOS: 0 days    Referring Provider: Dr Hal Hope  Primary Care Physician:  Deland Pretty, MD, do not have epic access to his records. Primary Gastroenterologist:  Dr. Fuller Plan    Reason for Consultation:  segmental colitis   HPI: Jade [name] is a 88 y.o. female.  PMH HEARING LOSS.  CAD on CT imaging, no interventions or MI.  Aortic atherosclerosis by CT imaging, no interventions or symptoms.  Hypothyroidism.  Glaucoma.  CKD 3.  NIDDM.  HLD.  HTN.  2016 Echo: EF 60 to 65%, No RV pathology, no signif valve dz.  1978 hysterectomy, colporrhaphy, cystouretopexy "for pelvic support problems". 2005 L oophorectomy, salpingectomy for benign ovarian cystadenofibroma.   Other Surgeries: appendectomy, cholecystectomy, hernia repair with mesh.  GERD, TA colon polyps, diverticulitis (2020).  Patient says she had colon surgery for diverticulitis but the 2005 Gyn operative note describes lysis of adhesions from area of recent diverticulitis, no bowel injury or bowel resection.  It appears that she may have misunderstood the specifics of that surgery. 2003 EGD.  Antral erythema.  Pathology : mild chronic gastritis, no H pylori. 2003 Colonoscopy: Nonbleeding internal hemorrhoids.  Sigmoid diverticulosis.  Small cecal polyp (TA).    2007 colonoscopy with internal hemorrhoid, otherwise normal study 2012 Colonoscopy:  7 mm polyp (TA) at hepatic flexure.  Moderate sigmoid to descending diverticulosis.  Hemorrhoids.  Per Dr Fuller Plan, pt aged out of repeat surveillance colonoscopy.  07/2019 EGD Mild reactive gastritis with erosions.  No H. pylori, metaplasia, dysplasia malignancy    For about a month experiencing waxing, waning LLQ pain w some constipation.  Pain reminiscent of previous  diverticulitis.  Normally has daily bowel movements but recently experiencing constipation.  Its been about a week since she last had a bowel movement.  A couple of times she is seen pink-tinged rectal mucus but no overt hematochezia or bloody stool.  No diarrhea, nausea, vomiting, fever.  Appetite and food consumption not compromised.  Food does not exacerbate the problem.  Concern was diverticulitis.  Empiric course of metronidazole as well as 1 other course of antibiotics prescribed initially for UTI (was experiencing dysuria which resolved) then for diverticulitis provided some relief but did not resolve the problem.  Provider ordered CT scan revealing segmental left-sided colitis.  She was forwarded to the ER for admission.  Zosyn initiated.  Currently pain-free.  No shortness of breath.  BPs 140s-113/70s-50s.  No Tachycardia, fever, hypoxia.   Hgb 11.3 ...10.5, MCV 93.  WBC, platelets normal.   T bili, alk phos, transaminases normal.  Lipase 77 (range 11- 51).  Normal lactate. CTAP w contrast: colitis from splenic flex to mid sigmoid, this area also contains numerous tics but no diverticulitis.  Colonic thickening vs underdistention in prox transverse.  Advanced aortic vasc dz w/O aneurysm.  PV patent.  Incidental spinal disc dz.    No ETOH, tobacco.  Widowed.  2 sons, one in [place] the other in [place].  4 grandchildren, 6 great-grandchildren.  Widowed.  Resides independently in [place].  Still driving and just renewed her driver's license.  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 No family history colorectal or digestive organ disease.    Past Medical History:  Diagnosis Date   Allergic rhinitis    Bronchitis    Cataract    CKD (chronic kidney disease)    stage 3 kidney failure per pt   Diabetes mellitus    Diverticulosis 11/2010   GERD (gastroesophageal reflux disease)    Glaucoma    Hyperlipidemia    Hypertension    Hypothyroid    Internal hemorrhoids    Tubular  adenoma polyp of rectum 02/2002   Type 2 diabetes mellitus ([place])     Past Surgical History:  Procedure Laterality Date   APPENDECTOMY     BREAST CYST EXCISION     CHOLECYSTECTOMY     COLONOSCOPY     DIVERTICULITIS  2004   SURGERY FOR DIVERTICULITIS PER PT.   HERNIA REPAIR     w/ mesh   POLYPECTOMY     TONSILLECTOMY     TOTAL ABDOMINAL HYSTERECTOMY W/ BILATERAL SALPINGOOPHORECTOMY      Prior to Admission medications   Medication Sig Start Date End Date Taking? Authorizing Provider  acetaminophen (TYLENOL) 500 MG tablet Take by mouth.    [provider]  amLODipine (NORVASC) 2.5 MG tablet Take 2.5 mg by mouth daily.  07/24/19   [provider]  atorvastatin (LIPITOR) 10 MG tablet 1 tablet    [provider]  azelastine (ASTELIN) 0.1 % nasal spray Place 2 sprays into both nostrils 2 (two) times daily. Use in each nostril as directed 08/19/20   Freddi Starr, MD  betamethasone dipropionate 0.05 % lotion SMARTSIG:Sparingly Topical Daily PRN 09/09/20   [provider]  Cephalexin 250 MG tablet SMARTSIG:1 Tablet(s) By Mouth Every 12 Hours 06/19/21   [provider]  Cholecalciferol (VITAMIN D) 1000 UNITS capsule Take 1,000 Units by mouth daily.    [provider]  Coenzyme Q10 (COQ10) 50 MG CAPS Take 1 capsule by mouth daily.    [provider]  fish oil-omega-3 fatty acids 1000 MG capsule Take 1 capsule by mouth daily.    [provider]  FREESTYLE LITE test strip TEST QAM 09/13/14   [provider]  gabapentin (NEURONTIN) 100 MG capsule Take 1 capsule (100 mg total) by mouth 3 (three) times daily as needed (Nerve pain). 04/28/20   Gregor Hams, MD  glucose blood ([employer] TEST STRIPS) test strip Check blood sugar once daily    [provider]  hydrochlorothiazide (MICROZIDE) 12.5 MG capsule Take 1 capsule by mouth daily. 1/2    [provider]  lactobacillus acidophilus (BACID) TABS tablet  Take 1 tablet by mouth daily. Probiotic    [provider]  levothyroxine (SYNTHROID) 75 MCG tablet 1 TABLET ONCE A DAY ORALLY 90 DAYS    [provider]  meclizine (ANTIVERT) 25 MG tablet Take 25 mg by mouth 3 (three) times daily as needed for dizziness.    [provider]  Melatonin 3 MG CAPS 1 tablet at bedtime as needed    [provider]  metFORMIN (GLUCOPHAGE) 500 MG tablet Take 1 tablet (500 mg total) by mouth daily with breakfast. 04/28/18   Ghimire, Henreitta Leber, MD  Multiple Vitamins-Minerals (ICAPS AREDS 2 PO) Take 1 tablet by mouth daily.    [provider]  mupirocin ointment (BACTROBAN) 2 % as needed. nostrils 12/19/18   [provider]  olmesartan [facility])  40 MG tablet Take 1 tablet by mouth daily.    [provider]  pantoprazole (PROTONIX) 40 MG tablet TAKE 1 TABLET(40 MG) BY MOUTH DAILY 07/02/20   Ladene Artist, MD  pantoprazole (PROTONIX) 40 MG tablet 1 tablet 05/18/19   [provider]  sertraline (ZOLOFT) 25 MG tablet Take 25 mg by mouth daily. 06/20/21   [provider]  spironolactone (ALDACTONE) 25 MG tablet TAKE 1 TABLET(25 MG) BY MOUTH DAILY 02/16/21   Patwardhan, Manish J, MD  timolol (TIMOPTIC) 0.5 % ophthalmic solution Place 1 drop into both eyes 2 (two) times daily.  08/26/14   [provider]  triamcinolone (KENALOG) 0.025 % cream Apply topically 2 (two) times daily. 09/14/21   [provider]    Scheduled Meds:  amLODipine  2.5 mg Oral Daily   atorvastatin  10 mg Oral Daily   heparin  5,000 Units Subcutaneous Q8H   insulin aspart  0-6 Units Subcutaneous TID WC   irbesartan  300 mg Oral Daily   levothyroxine  75 mcg Oral Q0600   sertraline  25 mg Oral Daily   spironolactone  25 mg Oral Daily   Infusions:  lactated ringers     piperacillin-tazobactam (ZOSYN)  IV 3.375 g (12/01/21 0605)   PRN Meds: acetaminophen **OR** acetaminophen   Allergies as of 11/30/2021 -  Review Complete 11/30/2021  Allergen Reaction Noted   Codeine     Miralax [polyethylene glycol] Swelling 11/06/2014   Pentazocine Nausea And Vomiting 02/19/2019   Pentazocine lactate     Tramadol hcl      Family History  Problem Relation Age of Onset   Lung cancer Father    Esophageal cancer Father    Heart disease Mother    Heart disease Brother    Diabetes Maternal Aunt    Diabetes Maternal Uncle    Diabetes Brother    Colon cancer Neg Hx    Rectal cancer Neg Hx    Stomach cancer Neg Hx     Social History   Socioeconomic History   Marital status: Widowed    Spouse name: Not on file   Number of children: 2   Years of education: Not on file   Highest education level: Not on file  Occupational History   Not on file  Tobacco Use   Smoking status: Never   Smokeless tobacco: Never  Vaping Use   Vaping Use: Never used  Substance and Sexual Activity   Alcohol use: No   Drug use: No   Sexual activity: Not on file  Other Topics Concern   Not on file  Social History Narrative   Not on file   Social Determinants of Health   Financial Resource Strain: Not on file  Food Insecurity: Not on file  Transportation Needs: Not on file  Physical Activity: Not on file  Stress: Not on file  Social Connections: Not on file  Intimate Partner Violence: Not on file    REVIEW OF SYSTEMS: Constitutional: No weakness, no fatigue. ENT:  No nose bleeds Pulm: No cough or dyspnea. CV:  No palpitations, no LE edema.  No chest pain. GU:  No hematuria, no frequency GI: See HPI. Heme: No unusual or excessive bleeding/bruising. Transfusions: None. Neuro:  No headaches, no peripheral tingling or numbness.  No dizziness.  No syncope.  No seizures. Derm:  No itching, no rash or sores.  Endocrine:  No sweats or chills.  No polyuria or dysuria Immunization: Reviewed, not queried. Travel:  None beyond local counties in last few months.    PHYSICAL EXAM: Vital signs in last 24  hours: Vitals:   12/01/21 0427 12/01/21 0807  BP: (!) 113/53 (!) 115/57  Pulse: 61 64  Resp: 16 16  Temp:  97.6 F (36.4 C)  SpO2: 97% 97%   Wt Readings from Last 3 Encounters:  11/30/21 43.8 kg  11/12/21 43.8 kg  12/24/20 44 kg    General: Delightful.  Pleasant, well-appearing, comfortable.  Alert and able to provide great information. Head: No facial asymmetry or swelling.  No signs of head trauma. Eyes: No conjunctival pallor.  EOMI. Ears: Mild to moderately hard of hearing which can be overcome if you speak clearly, a bit loud and close to the patient.  She left her hearing aids at home for fear that he got lost Nose: No congestion or discharge Mouth: Some missing teeth but remaining teeth in good repair.  Tongue midline.  Mucosa is moist, pink, clear. Neck: No JVD, no thyromegaly, no masses Lungs: Some dry crackles at the bases more prominent on the right.  Otherwise excellent breath sounds.  No labored breathing or cough Heart: RRR.  No MRG.  S1, S2 present. Abdomen: Soft, not tender, not distended.  No HSM, masses, bruits, hernias..   Rectal: Deferred Musc/Skeltl: No joint redness, swelling or gross deformity. Extremities: No CCE.  Feet are warm. Neurologic: Oriented x3.  Moves all 4 limbs without tremor.  Did not formally test strength but no gross deficits. Skin: No rash, no sores, no suspicious lesions, no telangiectasia. Nodes: No cervical or inguinal adenopathy. Psych: Pleasant, calm, fluid speech.    Intake/Output from previous day: 08/07 0701 - 08/08 0700 In: 1058.4 [IV Piggyback:1058.4] Out: -  Intake/Output this shift: No intake/output data recorded.  LAB RESULTS: Recent Labs    11/30/21 1752 12/01/21 0421  WBC 10.6* 8.2  HGB 11.3* 10.5*  HCT 34.7* 31.3*  PLT 283 241   BMET Lab Results  Component Value Date   NA 137 12/01/2021   NA 137 11/30/2021   NA 137 10/27/2020   K 3.5 12/01/2021   K 3.8 11/30/2021   K 4.4 10/27/2020   CL 108 12/01/2021    CL 101 11/30/2021   CL 103 10/27/2020   CO2 24 12/01/2021   CO2 24 11/30/2021   CO2 25 10/27/2020   GLUCOSE 147 (H) 12/01/2021   GLUCOSE 93 11/30/2021   GLUCOSE 118 (H) 10/27/2020   BUN 10 12/01/2021   BUN 14 11/30/2021   BUN 37 (H) 10/27/2020   CREATININE 0.85 12/01/2021   CREATININE 0.96 11/30/2021   CREATININE 1.12 (H) 10/27/2020   CALCIUM 9.6 12/01/2021   CALCIUM 10.8 (H) 11/30/2021   CALCIUM 10.5 (H) 10/27/2020   LFT Recent Labs    11/30/21 1752 12/01/21 0421  PROT 7.7 5.9*  ALBUMIN 4.0 2.7*  AST 17 18  ALT 16 18  ALKPHOS 80 64  BILITOT 0.7 0.8   PT/INR No results found for: "INR", "PROTIME" Hepatitis Panel No results for input(s): "HEPBSAG", "HCVAB", "HEPAIGM", "HEPBIGM" in the last 72 hours. C-Diff No components found for: "CDIFF" Lipase     Component Value Date/Time   LIPASE 77 (H) 11/30/2021 1752    Drugs of Abuse  No results found for: "LABOPIA", "COCAINSCRNUR", "LABBENZ", "AMPHETMU", "THCU", "LABBARB"   RADIOLOGY STUDIES: CT ABDOMEN PELVIS W CONTRAST  Result Date: 11/30/2021 CLINICAL DATA:  Acute abdominal pain. Left lower quadrant pain. Concern for diverticulitis. EXAM: CT ABDOMEN AND PELVIS WITH CONTRAST  TECHNIQUE: Multidetector CT imaging of the abdomen and pelvis was performed using the standard protocol following bolus administration of intravenous contrast. RADIATION DOSE REDUCTION: This exam was performed according to the departmental dose-optimization program which includes automated exposure control, adjustment of the mA and/or kV according to patient size and/or use of iterative reconstruction technique. CONTRAST:  5m OMNIPAQUE IOHEXOL 300 MG/ML  SOLN COMPARISON:  CT 10/27/2020 FINDINGS: Lower chest: Scattered atelectasis. No confluent consolidation or pleural effusion. Hepatobiliary: No focal hepatic abnormality. Chronic intrahepatic biliary ductal dilatation postcholecystectomy. Normal caliber common bile duct. Pancreas: No ductal dilatation  or inflammation. Spleen: Normal in size without focal abnormality. Adrenals/Urinary Tract: Normal adrenal glands. No hydronephrosis or perinephric edema. Homogeneous renal enhancement with symmetric excretion on delayed phase imaging. There is simple bilateral renal cysts, needing no further follow-up. Mild left greater than right renal parenchymal thinning. No renal calculi or solid renal lesion. Urinary bladder is physiologically distended without wall thickening. Stomach/Bowel: There is colonic wall thickening and pericolonic edema involving the long length segment of the colon extending from the splenic flexure through the mid sigmoid colon. There multiple diverticula in this region, however no focally inflamed diverticulitis, and length is more typical of colitis. No bowel pneumatosis or perforation. Additional areas of wall thickening versus nondistention of the proximal transverse colon. There is no small bowel obstruction or inflammation. Unremarkable appearance of the stomach. Vascular/Lymphatic: Advanced aortic atherosclerosis. No aneurysm. No acute vascular findings. Patent portal vein. No abdominopelvic adenopathy. Reproductive: Status post hysterectomy. No adnexal masses. Other: No perforation or free air. No ascites or focal fluid collection. No abdominal wall hernia. Musculoskeletal: Degenerative change in the lumbar spine with multilevel degenerative disc disease and prominent lower lumbar facet hypertrophy. There are no acute or suspicious osseous abnormalities. IMPRESSION: 1. Colitis extending from the splenic flexure through the mid sigmoid colon. There are multiple diverticula in this region, however no focally inflamed diverticulitis, and length is more typical of colitis. No perforation or abscess. 2. Additional areas of wall thickening versus nondistention of the proximal transverse colon. Aortic Atherosclerosis (ICD10-I70.0). Electronically Signed   By: MKeith RakeM.D.   On: 11/30/2021  22:46      IMPRESSION:     Segmental colitis suspicious for ischemic colitis.  CT demonstrates nonaneurysmal aortic vascular disease.  Symptoms of intermittent left lower quadrant abdominal pain persisted after a couple of courses of antibiotics, 1 of which was metronidazole.  Based on CT imaging this does not appear to be diverticulitis.  WBCs are normal, no fever, no features c/w sepsis.  Day 2 Zosyn..Marland Kitchen History recurrent TA polyps.  Aged out of surveillance after latest colonoscopy 2012.    Mildly elevated lipase.  No pancreatic, hepatic, biliary pathology on CT.    PLAN:     May need to consider backing off on antihypertensives to allow for maximum colonic blood flow.    ? Role of formal angiography?  ? Need for vascular surgeon's input?  ? Need for flex sig vs colonoscopy?  Currently on clear liquids.  Could probably advance her diet but will discuss with Dr DLorenso Courier  Pt has not had meal associated worsening of abd pain.     SAzucena Freed 12/01/2021, 9:15 AM Phone [phone]

## 2021-12-02 ENCOUNTER — Inpatient Hospital Stay (HOSPITAL_COMMUNITY): Payer: Medicare Other | Admitting: Anesthesiology

## 2021-12-02 ENCOUNTER — Encounter (HOSPITAL_COMMUNITY): Payer: Self-pay | Admitting: Internal Medicine

## 2021-12-02 ENCOUNTER — Encounter (HOSPITAL_COMMUNITY)
Admission: EM | Disposition: A | Discharge status: Home / Self Care | Payer: Self-pay | Source: Home / Self Care | Attending: Family Medicine

## 2021-12-02 DIAGNOSIS — D649 Anemia, unspecified: Secondary | ICD-10-CM

## 2021-12-02 DIAGNOSIS — I509 Heart failure, unspecified: Secondary | ICD-10-CM

## 2021-12-02 DIAGNOSIS — K5730 Diverticulosis of large intestine without perforation or abscess without bleeding: Secondary | ICD-10-CM

## 2021-12-02 DIAGNOSIS — K529 Noninfective gastroenteritis and colitis, unspecified: Secondary | ICD-10-CM

## 2021-12-02 DIAGNOSIS — I2510 Atherosclerotic heart disease of native coronary artery without angina pectoris: Secondary | ICD-10-CM

## 2021-12-02 DIAGNOSIS — K648 Other hemorrhoids: Secondary | ICD-10-CM

## 2021-12-02 DIAGNOSIS — I110 Hypertensive heart disease with heart failure: Secondary | ICD-10-CM

## 2021-12-02 HISTORY — PX: FLEXIBLE SIGMOIDOSCOPY: SHX5431

## 2021-12-02 HISTORY — PX: BIOPSY: SHX5522

## 2021-12-02 LAB — COMPREHENSIVE METABOLIC PANEL
ALT: 17 U/L (ref 0–44)
AST: 18 U/L (ref 15–41)
Albumin: 2.8 g/dL — ABNORMAL LOW (ref 3.5–5.0)
Alkaline Phosphatase: 74 U/L (ref 38–126)
Anion gap: 5 (ref 5–15)
BUN: 6 mg/dL — ABNORMAL LOW (ref 8–23)
CO2: 27 mmol/L (ref 22–32)
Calcium: 9.9 mg/dL (ref 8.9–10.3)
Chloride: 108 mmol/L (ref 98–111)
Creatinine, Ser: 1.14 mg/dL — ABNORMAL HIGH (ref 0.44–1.00)
GFR, Estimated: 45 mL/min — ABNORMAL LOW (ref >60)
Glucose, Bld: 102 mg/dL — ABNORMAL HIGH (ref 70–99)
Potassium: 4.2 mmol/L (ref 3.5–5.1)
Sodium: 140 mmol/L (ref 135–145)
Total Bilirubin: 0.9 mg/dL (ref 0.3–1.2)
Total Protein: 6.5 g/dL (ref 6.5–8.1)

## 2021-12-02 LAB — CBC WITH DIFFERENTIAL/PLATELET
Abs Immature Granulocytes: 0.03 10*3/uL (ref 0.00–0.07)
Basophils Absolute: 0.1 10*3/uL (ref 0.0–0.1)
Basophils Relative: 1 %
Eosinophils Absolute: 0.7 10*3/uL — ABNORMAL HIGH (ref 0.0–0.5)
Eosinophils Relative: 7 %
HCT: 33.5 % — ABNORMAL LOW (ref 36.0–46.0)
Hemoglobin: 10.7 g/dL — ABNORMAL LOW (ref 12.0–15.0)
Immature Granulocytes: 0 %
Lymphocytes Relative: 16 %
Lymphs Abs: 1.5 10*3/uL (ref 0.7–4.0)
MCH: 29.7 pg (ref 26.0–34.0)
MCHC: 31.9 g/dL (ref 30.0–36.0)
MCV: 93.1 fL (ref 80.0–100.0)
Monocytes Absolute: 0.6 10*3/uL (ref 0.1–1.0)
Monocytes Relative: 7 %
Neutro Abs: 6.4 10*3/uL (ref 1.7–7.7)
Neutrophils Relative %: 69 %
Platelets: 246 10*3/uL (ref 150–400)
RBC: 3.6 MIL/uL — ABNORMAL LOW (ref 3.87–5.11)
RDW: 14.5 % (ref 11.5–15.5)
WBC: 9.2 10*3/uL (ref 4.0–10.5)
nRBC: 0 % (ref 0.0–0.2)

## 2021-12-02 LAB — GLUCOSE, CAPILLARY
Glucose-Capillary: 85 mg/dL (ref 70–99)
Glucose-Capillary: 87 mg/dL (ref 70–99)
Glucose-Capillary: 194 mg/dL — ABNORMAL HIGH (ref 70–99)

## 2021-12-02 SURGERY — SIGMOIDOSCOPY, FLEXIBLE
Anesthesia: Monitor Anesthesia Care

## 2021-12-02 MED ORDER — LACTATED RINGERS IV SOLN: INTRAVENOUS | Status: DC | PRN

## 2021-12-02 MED ORDER — HEPARIN SODIUM (PORCINE) 5000 UNIT/ML IJ SOLN
5000.0000 [IU] | Freq: Three times a day (TID) | INTRAMUSCULAR | Status: DC
  Administered 2021-12-03 – 2021-12-04 (×3): 5000 [IU] via SUBCUTANEOUS
  Filled 2021-12-02 (×4): qty 1

## 2021-12-02 MED ORDER — EPHEDRINE SULFATE-NACL 50-0.9 MG/10ML-% IV SOSY
PREFILLED_SYRINGE | INTRAVENOUS | Status: DC | PRN
  Administered 2021-12-02 (×2): 5 mg via INTRAVENOUS

## 2021-12-02 MED ORDER — PROPOFOL 500 MG/50ML IV EMUL
INTRAVENOUS | Status: DC | PRN
  Administered 2021-12-02: 75 ug/kg/min via INTRAVENOUS

## 2021-12-02 MED ORDER — LIDOCAINE 2% (20 MG/ML) 5 ML SYRINGE
INTRAMUSCULAR | Status: DC | PRN
  Administered 2021-12-02: 20 mg via INTRAVENOUS

## 2021-12-02 MED ORDER — ONDANSETRON HCL 4 MG/2ML IJ SOLN
INTRAMUSCULAR | Status: DC | PRN
  Administered 2021-12-02: 4 mg via INTRAVENOUS

## 2021-12-02 MED ORDER — DEXAMETHASONE SODIUM PHOSPHATE 10 MG/ML IJ SOLN
INTRAMUSCULAR | Status: DC | PRN
  Administered 2021-12-02: 5 mg via INTRAVENOUS

## 2021-12-02 NOTE — Progress Notes (Signed)
PROGRESS NOTE    Jade [name]  XTG:[id number] DOB: 30-Sep-1927 DOA: 11/30/2021 PCP: Deland Pretty, MD  Chief Complaint  Patient presents with   Abdominal Pain    Brief Narrative:  Jade [name] is Jade [name] 88 y.o. female with history of diabetes mellitus type 2, hypertension, hypothyroidism, hyperlipidemia, CAD has been experiencing left lower quadrant pain for almost Kinsie Belford month which patient states comes and goes.  Was recently placed on antibiotics by patient's primary care physician.  Patient states while she was taking antibiotics her pain had improved but has come back again.  Denies any diarrhea but patient states she actually has constipation.  No vomiting fever or chills.  Did not have any blood in the stools.   ED Course: In the ER patient had CT abdomen pelvis which shows features concerning for colitis.  Lactic acid was normal WBC was 10.6.  Patient was started on Zosyn and admitted for further work-up.  Was also given fluid bolus.    Assessment & Plan:   Principal Problem:   Colitis presumed infectious Active Problems:   Colitis   Essential hypertension   DM2 (diabetes mellitus, type 2) ([place])   Anemia   Dyslipidemia   Hypothyroidism  Assessment and Plan: Colitis CT with colitis extending from splenic flexure through mid sigmoid colon  S/p flex sig with localized inflammation in the sigmoid colon, ddx includes ischemia, SCAD, infection, IBD Appreciate GI recs zosyn  Essential hypertension Amlodipine   DM2 (diabetes mellitus, type 2) ([place]) SSI  Anemia Continue to monitor  Dyslipidemia lipitor  Hypothyroidism synthroid     DVT prophylaxis: heparin Code Status: full Family Communication: friends at bedside Disposition:   Status is: Inpatient Remains inpatient appropriate because: pending Gi recs   Consultants:  GI  Procedures:  Flex sig  Antimicrobials:  Anti-infectives (From admission, onward)    Start     Dose/Rate Route Frequency Ordered  Stop   12/01/21 0600  piperacillin-tazobactam (ZOSYN) IVPB 3.375 g       See Hyperspace for full Linked Orders Report.   3.375 g 12.5 mL/hr over 240 Minutes Intravenous Every 8 hours 11/30/21 2259     11/30/21 2300  piperacillin-tazobactam (ZOSYN) IVPB 3.375 g       See Hyperspace for full Linked Orders Report.   3.375 g 100 mL/hr over 30 Minutes Intravenous  Once 11/30/21 2259 11/30/21 2345       Subjective: No c/o pain Asking about eating  Objective: Vitals:   12/02/21 1208 12/02/21 1210 12/02/21 1220 12/02/21 1256  BP:  128/67 128/71 (!) 146/79  Pulse:  77 73 65  Resp:  (!) 25 (!) 22 15  Temp:    (!) 97.4 F (36.3 C)  TempSrc:      SpO2: 99% 97% 97%   Weight:      Height:        Intake/Output Summary (Last 24 hours) at 12/02/2021 1707 Last data filed at 12/02/2021 1154 Gross per 24 hour  Intake 400 ml  Output 600 ml  Net -200 ml   Filed Weights   11/30/21 1747  Weight: 43.8 kg    Examination:  General exam: Appears calm and comfortable  Respiratory system: unlabored Cardiovascular system: RRR Gastrointestinal system: Abdomen is nondistended, soft and nontender Central nervous system: Alert and oriented. No focal neurological deficits. Extremities: no LEE    Data Reviewed: I have personally reviewed following labs and imaging studies  CBC: Recent Labs  Lab 11/30/21 1752 12/01/21 0421 12/02/21 1000  WBC 10.6* 8.2 9.2  NEUTROABS  --  5.3 6.4  HGB 11.3* 10.5* 10.7*  HCT 34.7* 31.3* 33.5*  MCV 91.3 93.4 93.1  PLT 283 241 270    Basic Metabolic Panel: Recent Labs  Lab 11/30/21 1752 12/01/21 0421 12/02/21 1000  NA 137 137 140  K 3.8 3.5 4.2  CL 101 108 108  CO2 '24 24 27  '$ GLUCOSE 93 147* 102*  BUN 14 10 6*  CREATININE 0.96 0.85 1.14*  CALCIUM 10.8* 9.6 9.9    GFR: Estimated Creatinine Clearance: 21.5 mL/min (Zyan Mirkin) (by C-G formula based on SCr of 1.14 mg/dL (H)).  Liver Function Tests: Recent Labs  Lab 11/30/21 1752 12/01/21 0421  12/02/21 1000  AST '17 18 18  '$ ALT '16 18 17  '$ ALKPHOS 80 64 74  BILITOT 0.7 0.8 0.9  PROT 7.7 5.9* 6.5  ALBUMIN 4.0 2.7* 2.8*    CBG: Recent Labs  Lab 12/01/21 0756 12/01/21 1314 12/02/21 0736 12/02/21 1203 12/02/21 1608  GLUCAP 97 132* 85 87 194*     No results found for this or any previous visit (from the past 240 hour(s)).       Radiology Studies: CT ABDOMEN PELVIS W CONTRAST  Result Date: 11/30/2021 CLINICAL DATA:  Acute abdominal pain. Left lower quadrant pain. Concern for diverticulitis. EXAM: CT ABDOMEN AND PELVIS WITH CONTRAST TECHNIQUE: Multidetector CT imaging of the abdomen and pelvis was performed using the standard protocol following bolus administration of intravenous contrast. RADIATION DOSE REDUCTION: This exam was performed according to the departmental dose-optimization program which includes automated exposure control, adjustment of the mA and/or kV according to patient size and/or use of iterative reconstruction technique. CONTRAST:  79m OMNIPAQUE IOHEXOL 300 MG/ML  SOLN COMPARISON:  CT 10/27/2020 FINDINGS: Lower chest: Scattered atelectasis. No confluent consolidation or pleural effusion. Hepatobiliary: No focal hepatic abnormality. Chronic intrahepatic biliary ductal dilatation postcholecystectomy. Normal caliber common bile duct. Pancreas: No ductal dilatation or inflammation. Spleen: Normal in size without focal abnormality. Adrenals/Urinary Tract: Normal adrenal glands. No hydronephrosis or perinephric edema. Homogeneous renal enhancement with symmetric excretion on delayed phase imaging. There is simple bilateral renal cysts, needing no further follow-up. Mild left greater than right renal parenchymal thinning. No renal calculi or solid renal lesion. Urinary bladder is physiologically distended without wall thickening. Stomach/Bowel: There is colonic wall thickening and pericolonic edema involving the long length segment of the colon extending from the splenic  flexure through the mid sigmoid colon. There multiple diverticula in this region, however no focally inflamed diverticulitis, and length is more typical of colitis. No bowel pneumatosis or perforation. Additional areas of wall thickening versus nondistention of the proximal transverse colon. There is no small bowel obstruction or inflammation. Unremarkable appearance of the stomach. Vascular/Lymphatic: Advanced aortic atherosclerosis. No aneurysm. No acute vascular findings. Patent portal vein. No abdominopelvic adenopathy. Reproductive: Status post hysterectomy. No adnexal masses. Other: No perforation or free air. No ascites or focal fluid collection. No abdominal wall hernia. Musculoskeletal: Degenerative change in the lumbar spine with multilevel degenerative disc disease and prominent lower lumbar facet hypertrophy. There are no acute or suspicious osseous abnormalities. IMPRESSION: 1. Colitis extending from the splenic flexure through the mid sigmoid colon. There are multiple diverticula in this region, however no focally inflamed diverticulitis, and length is more typical of colitis. No perforation or abscess. 2. Additional areas of wall thickening versus nondistention of the proximal transverse colon. Aortic Atherosclerosis (ICD10-I70.0). Electronically Signed   By: MKeith RakeM.D.   On: 11/30/2021 22:46  Scheduled Meds:  amLODipine  2.5 mg Oral Daily   atorvastatin  10 mg Oral Daily   [START ON 12/03/2021] heparin  5,000 Units Subcutaneous Q8H   insulin aspart  0-6 Units Subcutaneous TID WC   levothyroxine  75 mcg Oral Q0600   Continuous Infusions:  piperacillin-tazobactam (ZOSYN)  IV 3.375 g (12/02/21 1438)     LOS: 1 day    Time spent: over 30 min    Jade Helper, MD [facility]   To contact the attending provider between 7A-7P or the covering provider during after hours 7P-7A, please log into the web site [email or web address] and access using universal [facility] password for that web site. If you do not have the password, please call the hospital operator.  12/02/2021, 5:07 PM

## 2021-12-02 NOTE — Anesthesia Procedure Notes (Signed)
Procedure Name: MAC Date/Time: 12/02/2021 11:14 AM  Performed by: Eligha Bridegroom, CRNAPre-anesthesia Checklist: Patient identified, Emergency Drugs available, Suction available, Patient being monitored and Timeout performed Patient Re-evaluated:Patient Re-evaluated prior to induction Oxygen Delivery Method: Nasal cannula Preoxygenation: Pre-oxygenation with 100% oxygen Induction Type: IV induction

## 2021-12-02 NOTE — Assessment & Plan Note (Addendum)
CT with colitis extending from splenic flexure through mid sigmoid colon  S/p flex sig with localized inflammation in the sigmoid colon, ddx includes ischemia, SCAD, infection, IBD Path with chronic minimally active colitis, will follow up with GI Appreciate GI recs -> concerning for SCAD over IBD, planning for PO abx and outpatient GI follow up

## 2021-12-02 NOTE — Anesthesia Postprocedure Evaluation (Signed)
Anesthesia Post Note  Patient: Jade [name]  Procedure(s) Performed: [place] BIOPSY     Patient location during evaluation: Endoscopy Anesthesia Type: MAC Level of consciousness: awake Pain management: satisfactory to patient Vital Signs Assessment: post-procedure vital signs reviewed and stable Respiratory status: spontaneous breathing Cardiovascular status: stable Postop Assessment: no apparent nausea or vomiting Anesthetic complications: no   No notable events documented.  Last Vitals:  Vitals:   12/02/21 1220 12/02/21 1256  BP: 128/71 (!) 146/79  Pulse: 73 65  Resp: (!) 22 15  Temp:  (!) 36.3 C  SpO2: 97%     Last Pain:  Vitals:   12/02/21 1220  TempSrc:   PainSc: 0-No pain                 Sherronda Sweigert

## 2021-12-02 NOTE — Assessment & Plan Note (Signed)
synthroid °

## 2021-12-02 NOTE — Progress Notes (Signed)
Mobility Specialist Progress Note   12/02/21 1813  Mobility  Activity Ambulated with assistance in hallway  Level of Assistance Contact guard assist, steadying assist  Assistive Device Front wheel walker  Distance Ambulated (ft) 172 ft  Activity Response Tolerated well  $Mobility charge 1 Mobility   Pre Mobility: 133/67 BP Post Mobility: 139/74 BP  Received in bed feeling a little groggy but denying pain, agreeable to mobility. Ambulation limited to fatigue and pt requesting to go back to room. Returned back to bed w/o fault and call bell in reach.   Holland Falling Mobility Specialist MS [facility] #:  [phone] Acute Rehab Office:  [phone]'

## 2021-12-02 NOTE — Assessment & Plan Note (Signed)
Continue to monitor

## 2021-12-02 NOTE — Assessment & Plan Note (Addendum)
Amlodipine HCTZ and spironolactone on hold - follow outpatient for ? Of resumption

## 2021-12-02 NOTE — Op Note (Signed)
[facility] Patient Name: Jade [name] Procedure Date : 12/02/2021 MRN: [id number] Attending MD: Georgian Co ,  Date of Birth: 1927-07-06 CSN: [id number] Age: 88 Admit Type: Inpatient Procedure:                Flexible Sigmoidoscopy Indications:              Abnormal CT of the GI tract Providers:                Adline Mango" Newt Minion, RN, Gloris Ham, Technician Referring MD:             Hospitalist team Medicines:                Monitored Anesthesia Care Complications:            No immediate complications. Estimated Blood Loss:     Estimated blood loss was minimal. Procedure:                Pre-Anesthesia Assessment:                           - Prior to the procedure, a History and Physical                            was performed, and patient medications and                            allergies were reviewed. The patient's tolerance of                            previous anesthesia was also reviewed. The risks                            and benefits of the procedure and the sedation                            options and risks were discussed with the patient.                            All questions were answered, and informed consent                            was obtained. Prior Anticoagulants: The patient has                            taken no previous anticoagulant or antiplatelet                            agents. ASA Grade Assessment: III - A patient with                            severe systemic disease. After reviewing the risks  and benefits, the patient was deemed in                            satisfactory condition to undergo the procedure.                           After obtaining informed consent, the scope was                            passed under direct vision. The [id number]-[id number] ([id number])                            Olympus endoscope was introduced through the anus                             and advanced to the the left transverse colon. The                            flexible sigmoidoscopy was accomplished without                            difficulty. The patient tolerated the procedure                            well. Scope In: Scope Out: Findings:      Multiple small and large-mouthed diverticula were found in the sigmoid       colon and descending colon.      Localized inflammation characterized by congestion (edema) and erythema       was found in the sigmoid colon. Biopsies were taken from the left colon       with a cold forceps for histology.      Non-bleeding internal hemorrhoids were found during retroflexion. Impression:               - Diverticulosis in the sigmoid colon and in the                            descending colon.                           - Localized inflammation was found in the sigmoid                            colon. Biopsied.                           - Non-bleeding internal hemorrhoids. Recommendation:           - Return patient to hospital ward for ongoing care.                           - Inflammation was seen in the sigmoid colon.                            Differential still includes ischemia, SCAD,  infection, and/or IBD.                           - Await pathology results.                           - The findings and recommendations were discussed                            with the patient and/or primary team. Procedure Code(s):        --- Professional ---                           [id number], Sigmoidoscopy, flexible; with biopsy, single                            or multiple Diagnosis Code(s):        --- Professional ---                           K64.8, Other hemorrhoids                           K52.9, Noninfective gastroenteritis and colitis,                            unspecified                           K57.30, Diverticulosis of large intestine without                             perforation or abscess without bleeding                           R93.3, Abnormal findings on diagnostic imaging of                            other parts of digestive tract CPT copyright 2019 American Medical Association. All rights reserved. The codes documented in this report are preliminary and upon coder review may  be revised to meet current compliance requirements. [name] "[name],  12/02/2021 12:03:01 PM Number of Addenda: 0

## 2021-12-02 NOTE — Plan of Care (Signed)

## 2021-12-02 NOTE — Progress Notes (Signed)
Pt didn't want to sign consent until she talked w/MD.

## 2021-12-02 NOTE — Transfer of Care (Signed)
Immediate Anesthesia Transfer of Care Note  Patient: Jade [name]  Procedure(s) Performed: FLEXIBLE SIGMOIDOSCOPY BIOPSY  Patient Location: PACU  Anesthesia Type:General  Level of Consciousness: drowsy  Airway & Oxygen Therapy: Patient Spontanous Breathing and Patient connected to nasal cannula oxygen  Post-op Assessment: Report given to RN and Post -op Vital signs reviewed and stable  Post vital signs: Reviewed and stable  Last Vitals:  Vitals Value Taken Time  BP 128/71 12/02/21 1220  Temp 36.4 C 12/02/21 1200  Pulse 70 12/02/21 1222  Resp 21 12/02/21 1222  SpO2 97 % 12/02/21 1222  Vitals shown include unvalidated device data.  Last Pain:  Vitals:   12/02/21 1220  TempSrc:   PainSc: 0-No pain         Complications: No notable events documented.

## 2021-12-02 NOTE — Hospital Course (Addendum)
Jade [name] is Jade [name] 88 y.o. female with history of diabetes mellitus type 2, hypertension, hypothyroidism, hyperlipidemia, CAD has been experiencing left lower quadrant pain for almost Jade [name] month which patient states comes and goes.  Was recently placed on antibiotics by patient's primary care physician.  Patient states while she was taking antibiotics her pain had improved but has come back again. In the ER patient had CT abdomen pelvis which shows features concerning for colitis. She had flex sig which showed sigmoid colitis.  Path showed minimally active colitis concerning for SCAD over IBD.  Plan for discharge on abx with GI follow up.  See below for additional details

## 2021-12-02 NOTE — Anesthesia Preprocedure Evaluation (Signed)
Anesthesia Evaluation  Patient identified by MRN, date of birth, ID band Patient awake    Reviewed: Allergy & Precautions, NPO status , Patient's Chart, lab work & pertinent test results  Airway Mallampati: II  TM Distance: >3 FB     Dental   Pulmonary    breath sounds clear to auscultation       Cardiovascular hypertension, + CAD and +CHF  + dysrhythmias  Rhythm:Regular Rate:Normal     Neuro/Psych PSYCHIATRIC DISORDERS negative neurological ROS     GI/Hepatic Neg liver ROS, GERD  ,  Endo/Other  diabetesHypothyroidism   Renal/GU Renal disease     Musculoskeletal  (+) Arthritis ,   Abdominal   Peds  Hematology   Anesthesia Other Findings   Reproductive/Obstetrics                             Anesthesia Physical Anesthesia Plan  ASA: 3  Anesthesia Plan: MAC   Post-op Pain Management:    Induction:   PONV Risk Score and Plan: Ondansetron and Treatment may vary due to age or medical condition  Airway Management Planned: Nasal Cannula and Simple Face Mask  Additional Equipment:   Intra-op Plan:   Post-operative Plan:   Informed Consent: I have reviewed the patients History and Physical, chart, labs and discussed the procedure including the risks, benefits and alternatives for the proposed anesthesia with the patient or authorized representative who has indicated his/her understanding and acceptance.     Dental advisory given  Plan Discussed with: CRNA and Anesthesiologist  Anesthesia Plan Comments:         Anesthesia Quick Evaluation

## 2021-12-02 NOTE — Assessment & Plan Note (Signed)
lipitor

## 2021-12-02 NOTE — Anesthesia Postprocedure Evaluation (Signed)
Anesthesia Post Note  Patient: Jade [name]  Procedure(s) Performed: FLEXIBLE SIGMOIDOSCOPY BIOPSY     Patient location during evaluation: PACU Anesthesia Type: MAC Level of consciousness: awake Pain management: pain level controlled Vital Signs Assessment: post-procedure vital signs reviewed and stable Respiratory status: spontaneous breathing Cardiovascular status: stable Postop Assessment: no apparent nausea or vomiting Anesthetic complications: no   No notable events documented.  Last Vitals:  Vitals:   12/02/21 0733 12/02/21 1102  BP: 125/63 124/72  Pulse: 61 72  Resp: 15 17  Temp: 36.7 C 36.7 C  SpO2: 97% 91%    Last Pain:  Vitals:   12/02/21 1102  TempSrc: Temporal  PainSc: 0-No pain                 Milus Fritze

## 2021-12-02 NOTE — Interval H&P Note (Signed)
History and Physical Interval Note:  12/02/2021 10:58 AM  Jade [name]  has presented today for surgery, with the diagnosis of Segmental colitis.  Suspect ischemic..  The various methods of treatment have been discussed with the patient and family. After consideration of risks, benefits and other options for treatment, the patient has consented to  Procedure(s): FLEXIBLE SIGMOIDOSCOPY (N/A) as a surgical intervention.  The patient's history has been reviewed, patient examined, no change in status, stable for surgery.  I have reviewed the patient's chart and labs.  Questions were answered to the patient's satisfaction.     Sharyn Creamer

## 2021-12-02 NOTE — Assessment & Plan Note (Addendum)
Resume home metformin

## 2021-12-03 DIAGNOSIS — K529 Noninfective gastroenteritis and colitis, unspecified: Secondary | ICD-10-CM | POA: Diagnosis not present

## 2021-12-03 LAB — MAGNESIUM: Magnesium: 1.4 mg/dL — ABNORMAL LOW (ref 1.7–2.4)

## 2021-12-03 LAB — CBC WITH DIFFERENTIAL/PLATELET
Abs Immature Granulocytes: 0.04 10*3/uL (ref 0.00–0.07)
Basophils Absolute: 0 10*3/uL (ref 0.0–0.1)
Basophils Relative: 0 %
Eosinophils Absolute: 0 10*3/uL (ref 0.0–0.5)
Eosinophils Relative: 0 %
HCT: 30 % — ABNORMAL LOW (ref 36.0–46.0)
Hemoglobin: 9.9 g/dL — ABNORMAL LOW (ref 12.0–15.0)
Immature Granulocytes: 0 %
Lymphocytes Relative: 9 %
Lymphs Abs: 1.1 10*3/uL (ref 0.7–4.0)
MCH: 30.5 pg (ref 26.0–34.0)
MCHC: 33 g/dL (ref 30.0–36.0)
MCV: 92.3 fL (ref 80.0–100.0)
Monocytes Absolute: 0.4 10*3/uL (ref 0.1–1.0)
Monocytes Relative: 3 %
Neutro Abs: 10.9 10*3/uL — ABNORMAL HIGH (ref 1.7–7.7)
Neutrophils Relative %: 88 %
Platelets: 234 10*3/uL (ref 150–400)
RBC: 3.25 MIL/uL — ABNORMAL LOW (ref 3.87–5.11)
RDW: 14.4 % (ref 11.5–15.5)
WBC: 12.4 10*3/uL — ABNORMAL HIGH (ref 4.0–10.5)
nRBC: 0 % (ref 0.0–0.2)

## 2021-12-03 LAB — GLUCOSE, CAPILLARY
Glucose-Capillary: 115 mg/dL — ABNORMAL HIGH (ref 70–99)
Glucose-Capillary: 169 mg/dL — ABNORMAL HIGH (ref 70–99)
Glucose-Capillary: 72 mg/dL (ref 70–99)
Glucose-Capillary: 103 mg/dL — ABNORMAL HIGH (ref 70–99)

## 2021-12-03 LAB — COMPREHENSIVE METABOLIC PANEL
ALT: 16 U/L (ref 0–44)
AST: 15 U/L (ref 15–41)
Albumin: 2.6 g/dL — ABNORMAL LOW (ref 3.5–5.0)
Alkaline Phosphatase: 72 U/L (ref 38–126)
Anion gap: 6 (ref 5–15)
BUN: 7 mg/dL — ABNORMAL LOW (ref 8–23)
CO2: 25 mmol/L (ref 22–32)
Calcium: 9.8 mg/dL (ref 8.9–10.3)
Chloride: 104 mmol/L (ref 98–111)
Creatinine, Ser: 1.03 mg/dL — ABNORMAL HIGH (ref 0.44–1.00)
GFR, Estimated: 51 mL/min — ABNORMAL LOW (ref >60)
Glucose, Bld: 142 mg/dL — ABNORMAL HIGH (ref 70–99)
Potassium: 4.2 mmol/L (ref 3.5–5.1)
Sodium: 135 mmol/L (ref 135–145)
Total Bilirubin: 0.6 mg/dL (ref 0.3–1.2)
Total Protein: 6 g/dL — ABNORMAL LOW (ref 6.5–8.1)

## 2021-12-03 LAB — PHOSPHORUS: Phosphorus: 2.6 mg/dL (ref 2.5–4.6)

## 2021-12-03 LAB — SURGICAL PATHOLOGY

## 2021-12-03 MED ORDER — MAGNESIUM SULFATE 4 GM/100ML IV SOLN
4.0000 g | Freq: Once | INTRAVENOUS | Status: AC
  Administered 2021-12-03: 4 g via INTRAVENOUS
  Filled 2021-12-03: qty 100

## 2021-12-03 MED ORDER — CIPROFLOXACIN HCL 500 MG PO TABS
500.0000 mg | ORAL_TABLET | Freq: Every day | ORAL | Status: DC
  Administered 2021-12-03 – 2021-12-04 (×2): 500 mg via ORAL
  Filled 2021-12-03 (×2): qty 1

## 2021-12-03 MED ORDER — MAGNESIUM SULFATE 2 GM/50ML IV SOLN
INTRAVENOUS | Status: AC
  Filled 2021-12-03: qty 50

## 2021-12-03 MED ORDER — METRONIDAZOLE 500 MG PO TABS
500.0000 mg | ORAL_TABLET | Freq: Three times a day (TID) | ORAL | Status: DC
  Administered 2021-12-03 – 2021-12-04 (×2): 500 mg via ORAL
  Filled 2021-12-03 (×2): qty 1

## 2021-12-03 NOTE — Progress Notes (Signed)
PHARMACY NOTE:  ANTIMICROBIAL RENAL DOSAGE ADJUSTMENT  Current antimicrobial regimen includes a mismatch between antimicrobial dosage and estimated renal function.  As per policy approved by the Pharmacy & Therapeutics and Medical Executive Committees, the antimicrobial dosage will be adjusted accordingly.  Current antimicrobial dosage:  Cipro 500 mg po q12 hr  Indication: Colitis  Renal Function:   Estimated Creatinine Clearance: 23.8 mL/min (A) (by C-G formula based on SCr of 1.03 mg/dL (H)). '[]'$      On intermittent HD, scheduled: '[]'$      On CRRT    Antimicrobial dosage has been changed to:  Cipro 500 mg po q24hr    Thank you for allowing pharmacy to be a part of this patient's care. Nicole Cella, RPh Clinical Pharmacist Please check AMION for all [place] phone numbers After 10:00 PM, call [place] [phone]  12/03/2021 7:44 PM

## 2021-12-03 NOTE — Plan of Care (Signed)

## 2021-12-03 NOTE — Progress Notes (Signed)
Mobility Specialist Progress Note   12/03/21 1540  Mobility  Activity Ambulated with assistance in hallway  Level of Assistance Contact guard assist, steadying assist  Assistive Device Front wheel walker  Distance Ambulated (ft) 222 ft  Activity Response Tolerated well  $Mobility charge 1 Mobility   Received pt in bed having no complaints and agreeable to mobility. Pt was asymptomatic throughout ambulation and returned to room w/o fault. Left at EOB w/ call bell in reach and all needs met.  Holland Falling Mobility Specialist MS [facility] #:  [phone] Acute Rehab Office:  [phone]

## 2021-12-03 NOTE — Progress Notes (Signed)
PROGRESS NOTE    Jade [name]  ZOX:[id number] DOB: 1928/03/15 DOA: 11/30/2021 PCP: Jade Pretty, MD  Chief Complaint  Patient presents with   Abdominal Pain    Brief Narrative:  Jade [name] is Jade [name] 88 y.o. female with history of diabetes mellitus type 2, hypertension, hypothyroidism, hyperlipidemia, CAD has been experiencing left lower quadrant pain for almost Ryun Velez month which patient states comes and goes.  Was recently placed on antibiotics by patient's primary care physician.  Patient states while she was taking antibiotics her pain had improved but has come back again.  Denies any diarrhea but patient states she actually has constipation.  No vomiting fever or chills.  Did not have any blood in the stools.   ED Course: In the ER patient had CT abdomen pelvis which shows features concerning for colitis.  Lactic acid was normal WBC was 10.6.  Patient was started on Zosyn and admitted for further work-up.  Was also given fluid bolus.    Assessment & Plan:   Principal Problem:   Colitis presumed infectious Active Problems:   Colitis   Essential hypertension   DM2 (diabetes mellitus, type 2) ([place])   Anemia   Dyslipidemia   Hypothyroidism  Assessment and Plan: Colitis CT with colitis extending from splenic flexure through mid sigmoid colon  S/p flex sig with localized inflammation in the sigmoid colon, ddx includes ischemia, SCAD, infection, IBD Path with chronic minimally active colitis, will follow up with GI Appreciate GI recs zosyn  Essential hypertension Amlodipine   DM2 (diabetes mellitus, type 2) ([place]) SSI  Anemia Continue to monitor  Dyslipidemia lipitor  Hypothyroidism synthroid     DVT prophylaxis: heparin Code Status: full Family Communication: none at bedside Disposition:   Status is: Inpatient Remains inpatient appropriate because: pending Gi recs   Consultants:  GI  Procedures:  Flex sig  Antimicrobials:  Anti-infectives (From  admission, onward)    Start     Dose/Rate Route Frequency Ordered Stop   12/01/21 0600  piperacillin-tazobactam (ZOSYN) IVPB 3.375 g       See Hyperspace for full Linked Orders Report.   3.375 g 12.5 mL/hr over 240 Minutes Intravenous Every 8 hours 11/30/21 2259     11/30/21 2300  piperacillin-tazobactam (ZOSYN) IVPB 3.375 g       See Hyperspace for full Linked Orders Report.   3.375 g 100 mL/hr over 30 Minutes Intravenous  Once 11/30/21 2259 11/30/21 2345       Subjective: No new complaints  Objective: Vitals:   12/02/21 1220 12/02/21 1256 12/03/21 0527 12/03/21 0908  BP: 128/71 (!) 146/79 (!) 117/58 125/63  Pulse: 73 65 64 (!) 58  Resp: (!) '22 15 16 18  '$ Temp:  (!) 97.4 F (36.3 C)  98.1 F (36.7 C)  TempSrc:      SpO2: 97%  97% 95%  Weight:      Height:        Intake/Output Summary (Last 24 hours) at 12/03/2021 1817 Last data filed at 12/02/2021 2055 Gross per 24 hour  Intake 240 ml  Output --  Net 240 ml   Filed Weights   11/30/21 1747  Weight: 43.8 kg    Examination:  General: No acute distress. Cardiovascular: RRR Lungs: unlabored Abdomen: Soft, nontender, nondistended  Neurological: Alert and oriented 3. Moves all extremities 4 with equal strength. Cranial nerves II through XII grossly intact. Extremities: No clubbing or cyanosis. No edema  Data Reviewed: I have personally reviewed following labs and imaging  studies  CBC: Recent Labs  Lab 11/30/21 1752 12/01/21 0421 12/02/21 1000 12/03/21 0227  WBC 10.6* 8.2 9.2 12.4*  NEUTROABS  --  5.3 6.4 10.9*  HGB 11.3* 10.5* 10.7* 9.9*  HCT 34.7* 31.3* 33.5* 30.0*  MCV 91.3 93.4 93.1 92.3  PLT 283 241 246 387    Basic Metabolic Panel: Recent Labs  Lab 11/30/21 1752 12/01/21 0421 12/02/21 1000 12/03/21 0227  NA 137 137 140 135  K 3.8 3.5 4.2 4.2  CL 101 108 108 104  CO2 '24 24 27 25  '$ GLUCOSE 93 147* 102* 142*  BUN 14 10 6* 7*  CREATININE 0.96 0.85 1.14* 1.03*  CALCIUM 10.8* 9.6 9.9 9.8  MG   --   --   --  1.4*  PHOS  --   --   --  2.6    GFR: Estimated Creatinine Clearance: 23.8 mL/min ([name]) (by C-G formula based on SCr of 1.03 mg/dL (H)).  Liver Function Tests: Recent Labs  Lab 11/30/21 1752 12/01/21 0421 12/02/21 1000 12/03/21 0227  AST '17 18 18 15  '$ ALT '16 18 17 16  '$ ALKPHOS 80 64 74 72  BILITOT 0.7 0.8 0.9 0.6  PROT 7.7 5.9* 6.5 6.0*  ALBUMIN 4.0 2.7* 2.8* 2.6*    CBG: Recent Labs  Lab 12/02/21 1203 12/02/21 1608 12/03/21 0826 12/03/21 1133 12/03/21 1638  GLUCAP 87 194* 115* 169* 72     No results found for this or any previous visit (from the past 240 hour(s)).       Radiology Studies: No results found.      Scheduled Meds:  amLODipine  2.5 mg Oral Daily   atorvastatin  10 mg Oral Daily   heparin  5,000 Units Subcutaneous Q8H   insulin aspart  0-6 Units Subcutaneous TID WC   levothyroxine  75 mcg Oral Q0600   Continuous Infusions:  magnesium sulfate     piperacillin-tazobactam (ZOSYN)  IV 3.375 g (12/03/21 0552)     LOS: 2 days    Time spent: over 30 min    Fayrene Helper, MD [facility]   To contact the attending provider between 7A-7P or the covering provider during after hours 7P-7A, please log into the web site [email or web address] and access using universal [place] password for that web site. If you do not have the password, please call the hospital operator.  12/03/2021, 6:17 PM

## 2021-12-03 NOTE — Progress Notes (Signed)
Gastroenterology Inpatient Follow Up    Subjective: Feels well. Denies abdominal pain. Able to tolerate a soft diet.  Objective: Vital signs in last 24 hours: Temp:  [98.1 F (36.7 C)] 98.1 F (36.7 C) (08/10 0908) Pulse Rate:  [58-64] 58 (08/10 0908) Resp:  [16-18] 18 (08/10 0908) BP: (117-125)/(58-63) 125/63 (08/10 0908) SpO2:  [95 %-97 %] 95 % (08/10 0908) Last BM Date : 12/02/21  Intake/Output from previous day: 08/09 0701 - 08/10 0700 In: 640 [P.O.:240; I.V.:400] Out: 600 [Urine:600] Intake/Output this shift: No intake/output data recorded.  General appearance: alert and cooperative Resp: no increased WOB Cardio: regular rate GI: soft, non-tender; bowel sounds normal; no masses,  no organomegaly Extremities: extremities normal, atraumatic, no cyanosis or edema  Lab Results: Recent Labs    12/01/21 0421 12/02/21 1000 12/03/21 0227  WBC 8.2 9.2 12.4*  HGB 10.5* 10.7* 9.9*  HCT 31.3* 33.5* 30.0*  PLT 241 246 234   BMET Recent Labs    12/01/21 0421 12/02/21 1000 12/03/21 0227  NA 137 140 135  K 3.5 4.2 4.2  CL 108 108 104  CO2 '24 27 25  '$ GLUCOSE 147* 102* 142*  BUN 10 6* 7*  CREATININE 0.85 1.14* 1.03*  CALCIUM 9.6 9.9 9.8   LFT Recent Labs    12/03/21 0227  PROT 6.0*  ALBUMIN 2.6*  AST 15  ALT 16  ALKPHOS 72  BILITOT 0.6   PT/INR No results for input(s): "LABPROT", "INR" in the last 72 hours. Hepatitis Panel No results for input(s): "HEPBSAG", "HCVAB", "HEPAIGM", "HEPBIGM" in the last 72 hours. C-Diff No results for input(s): "CDIFFTOX" in the last 72 hours.  Studies/Results: No results found.  Medications: I have reviewed the patient's current medications. Scheduled:  amLODipine  2.5 mg Oral Daily   atorvastatin  10 mg Oral Daily   heparin  5,000 Units Subcutaneous Q8H   insulin aspart  0-6 Units Subcutaneous TID WC   levothyroxine  75 mcg Oral Q0600   Continuous:  magnesium sulfate     piperacillin-tazobactam (ZOSYN)  IV  3.375 g (12/03/21 1923)   RWE:[id number] **OR** acetaminophen, magnesium sulfate  Flexible sigmoidoscopy 12/02/21: - Diverticulosis in the sigmoid colon and in the descending colon. - Localized inflammation was found in the sigmoid colon. Biopsied. - Non-bleeding internal hemorrhoids. Path: A. LEFT COLON, BIOPSY:  Chronic, minimally active colitis (see comment)  COMMENT:  Sections show multiple fragments of colonic mucosa exhibiting patchy  variable architectural distortion in the form of elongated tortuous  dilated branching and budding crypts in the lamina propria showing a  variable and focally mixed mononuclear cell infiltrate including  neutrophils which very focally infiltrate the crypt and surface  epithelium.  No basal plasmacytosis or foreshortening is appreciated.  There is no increase in intraepithelial lymphocytes and the collagen  table is of normal thickness.  No granulomas or parasites are seen.  There is no evidence of dysplasia or carcinoma.  Overall these findings are suggestive of a segmental diverticular  disease associated colitis over idiopathic inflammatory bowel disease.  Clinical and endoscopic correlation is recommended.   Assessment/Plan: 88 year old female with history of CAD, hypothyroidism, GERD, prior diverticulitis, CKD, DM presents with abdominal pain over the last month. CT A/P showed colitis extending from the splenic flexure through the mid-sigmoid colon with multiple diverticula in this region. No diverticulitis was noted. Flex sig was performed yesterday that showed some sigmoid colitis that was biopsied and diverticulosis. Path returned today showed minimally active colitis  favoring SCAD over IBD. Patient is doing well today, ab pain has resolved. She is tolerating her soft diet without issues. Will transition her from IV and PO antibiotics - Advanced diet as tolerated - Will switch her from IV Zosyn to PO cipro/flagyl to complete a 10 day course of  antibiotics for SCAD - We will arrange for GI follow up - GI will sign off. Please call if any new questions arise   LOS: 2 days   Sharyn Creamer 12/03/2021, 7:29 PM

## 2021-12-04 ENCOUNTER — Other Ambulatory Visit (HOSPITAL_COMMUNITY): Payer: Self-pay

## 2021-12-04 ENCOUNTER — Telehealth: Payer: Self-pay

## 2021-12-04 LAB — CBC WITH DIFFERENTIAL/PLATELET
Abs Immature Granulocytes: 0.05 10*3/uL (ref 0.00–0.07)
Basophils Absolute: 0.1 10*3/uL (ref 0.0–0.1)
Basophils Relative: 0 %
Eosinophils Absolute: 0.4 10*3/uL (ref 0.0–0.5)
Eosinophils Relative: 3 %
HCT: 29.5 % — ABNORMAL LOW (ref 36.0–46.0)
Hemoglobin: 9.8 g/dL — ABNORMAL LOW (ref 12.0–15.0)
Immature Granulocytes: 0 %
Lymphocytes Relative: 16 %
Lymphs Abs: 1.9 10*3/uL (ref 0.7–4.0)
MCH: 30.6 pg (ref 26.0–34.0)
MCHC: 33.2 g/dL (ref 30.0–36.0)
MCV: 92.2 fL (ref 80.0–100.0)
Monocytes Absolute: 0.7 10*3/uL (ref 0.1–1.0)
Monocytes Relative: 6 %
Neutro Abs: 8.9 10*3/uL — ABNORMAL HIGH (ref 1.7–7.7)
Neutrophils Relative %: 75 %
Platelets: 244 10*3/uL (ref 150–400)
RBC: 3.2 MIL/uL — ABNORMAL LOW (ref 3.87–5.11)
RDW: 14.5 % (ref 11.5–15.5)
WBC: 12 10*3/uL — ABNORMAL HIGH (ref 4.0–10.5)
nRBC: 0 % (ref 0.0–0.2)

## 2021-12-04 LAB — COMPREHENSIVE METABOLIC PANEL
ALT: 17 U/L (ref 0–44)
AST: 19 U/L (ref 15–41)
Albumin: 2.5 g/dL — ABNORMAL LOW (ref 3.5–5.0)
Alkaline Phosphatase: 59 U/L (ref 38–126)
Anion gap: 5 (ref 5–15)
BUN: 13 mg/dL (ref 8–23)
CO2: 23 mmol/L (ref 22–32)
Calcium: 9.3 mg/dL (ref 8.9–10.3)
Chloride: 106 mmol/L (ref 98–111)
Creatinine, Ser: 1.17 mg/dL — ABNORMAL HIGH (ref 0.44–1.00)
GFR, Estimated: 44 mL/min — ABNORMAL LOW (ref >60)
Glucose, Bld: 108 mg/dL — ABNORMAL HIGH (ref 70–99)
Potassium: 4 mmol/L (ref 3.5–5.1)
Sodium: 134 mmol/L — ABNORMAL LOW (ref 135–145)
Total Bilirubin: 0.7 mg/dL (ref 0.3–1.2)
Total Protein: 6 g/dL — ABNORMAL LOW (ref 6.5–8.1)

## 2021-12-04 LAB — GLUCOSE, CAPILLARY: Glucose-Capillary: 71 mg/dL (ref 70–99)

## 2021-12-04 LAB — PHOSPHORUS: Phosphorus: 2.6 mg/dL (ref 2.5–4.6)

## 2021-12-04 LAB — MAGNESIUM: Magnesium: 2 mg/dL (ref 1.7–2.4)

## 2021-12-04 MED ORDER — METRONIDAZOLE 500 MG PO TABS
500.0000 mg | ORAL_TABLET | Freq: Three times a day (TID) | ORAL | 0 refills | Status: AC
  Filled 2021-12-04: qty 15, 5d supply, fill #0

## 2021-12-04 MED ORDER — LACTULOSE 10 GM/15ML PO SOLN
10.0000 g | Freq: Once | ORAL | Status: AC
Start: 2021-12-04 — End: 2021-12-04
  Administered 2021-12-04: 10 g via ORAL
  Filled 2021-12-04: qty 15

## 2021-12-04 MED ORDER — CIPROFLOXACIN HCL 500 MG PO TABS
500.0000 mg | ORAL_TABLET | Freq: Every day | ORAL | 0 refills | Status: AC
  Filled 2021-12-04: qty 5, 5d supply, fill #0

## 2021-12-04 NOTE — Plan of Care (Signed)
  Problem: Education: Goal: Knowledge of General Education information will improve Description: Including pain rating scale, medication(s)/side effects and non-pharmacologic comfort measures 12/04/2021 1126 by Caroll Rancher, RN Outcome: Adequate for Discharge 12/04/2021 1125 by Caroll Rancher, RN Outcome: Progressing   Problem: Clinical Measurements: Goal: Ability to maintain clinical measurements within normal limits will improve Outcome: Adequate for Discharge Goal: Will remain free from infection Outcome: Adequate for Discharge Goal: Diagnostic test results will improve Outcome: Adequate for Discharge Goal: Respiratory complications will improve 12/04/2021 1126 by Caroll Rancher, RN Outcome: Adequate for Discharge 12/04/2021 1125 by Caroll Rancher, RN Outcome: Progressing Goal: Cardiovascular complication will be avoided Outcome: Adequate for Discharge   Problem: Activity: Goal: Risk for activity intolerance will decrease 12/04/2021 1126 by Caroll Rancher, RN Outcome: Adequate for Discharge 12/04/2021 1125 by Caroll Rancher, RN Outcome: Progressing   Problem: Nutrition: Goal: Adequate nutrition will be maintained 12/04/2021 1126 by Caroll Rancher, RN Outcome: Adequate for Discharge 12/04/2021 1125 by Caroll Rancher, RN Outcome: Progressing   Problem: Coping: Goal: Level of anxiety will decrease 12/04/2021 1126 by Caroll Rancher, RN Outcome: Adequate for Discharge 12/04/2021 1125 by Caroll Rancher, RN Outcome: Progressing   Problem: Elimination: Goal: Will not experience complications related to bowel motility Outcome: Adequate for Discharge Goal: Will not experience complications related to urinary retention Outcome: Adequate for Discharge   Problem: Pain Managment: Goal: General experience of comfort will improve 12/04/2021 1126 by Caroll Rancher, RN Outcome: Adequate for  Discharge 12/04/2021 1125 by Caroll Rancher, RN Outcome: Progressing   Problem: Safety: Goal: Ability to remain free from injury will improve Outcome: Adequate for Discharge   Problem: Skin Integrity: Goal: Risk for impaired skin integrity will decrease Outcome: Adequate for Discharge   Problem: Education: Goal: Ability to describe self-care measures that may prevent or decrease complications (Diabetes Survival Skills Education) will improve Outcome: Adequate for Discharge Goal: Individualized Educational Video(s) Outcome: Adequate for Discharge   Problem: Coping: Goal: Ability to adjust to condition or change in health will improve Outcome: Adequate for Discharge   Problem: Fluid Volume: Goal: Ability to maintain a balanced intake and output will improve 12/04/2021 1126 by Caroll Rancher, RN Outcome: Adequate for Discharge 12/04/2021 1125 by Caroll Rancher, RN Outcome: Progressing   Problem: Health Behavior/Discharge Planning: Goal: Ability to identify and utilize available resources and services will improve Outcome: Adequate for Discharge Goal: Ability to manage health-related needs will improve Outcome: Adequate for Discharge   Problem: Metabolic: Goal: Ability to maintain appropriate glucose levels will improve Outcome: Adequate for Discharge   Problem: Nutritional: Goal: Maintenance of adequate nutrition will improve 12/04/2021 1126 by Caroll Rancher, RN Outcome: Adequate for Discharge 12/04/2021 1125 by Caroll Rancher, RN Outcome: Progressing Goal: Progress toward achieving an optimal weight will improve Outcome: Adequate for Discharge   Problem: Skin Integrity: Goal: Risk for impaired skin integrity will decrease Outcome: Adequate for Discharge   Problem: Tissue Perfusion: Goal: Adequacy of tissue perfusion will improve Outcome: Adequate for Discharge

## 2021-12-04 NOTE — Plan of Care (Signed)

## 2021-12-04 NOTE — Progress Notes (Signed)
  Transition of Care [facility]) Screening Note   Patient Details  Name: Jade [name] Date of Birth: 1927-05-01   Transition of Care [facility]) CM/SW Contact:    Bartholomew Crews, RN Phone Number: [phone] 12/04/2021, 8:42 AM    Transition of Care Department [facility]) has reviewed patient and no TOC needs have been identified at this time. We will continue to monitor patient advancement through interdisciplinary progression rounds. If new patient transition needs arise, please place a TOC consult.

## 2021-12-04 NOTE — Care Management Important Message (Signed)
Important Message  Patient Details  Name: Jade [name] MRN: [id number] Date of Birth: 23-Feb-1928   Medicare Important Message Given:  Yes     Hannah Beat 12/04/2021, 2:39 PM

## 2021-12-04 NOTE — Discharge Summary (Signed)
Physician Discharge Summary  Jade [name] VWU:[id number] DOB: [birth year] DOA: 11/30/2021  PCP: Jade Pretty, MD  Admit date: 11/30/2021 Discharge date: 12/04/2021  Time spent: 40 minutes  Recommendations for Outpatient Follow-up:  Follow outpatient CBC/CMP  Follow with GI outpatient, complete abx course Spironolactone and HCTZ on hold at time of discharge, follow with PCP outpatient regarding whether these should be resumed   Discharge Diagnoses:  Principal Problem:   Colitis presumed infectious Active Problems:   Colitis   Essential hypertension   DM2 (diabetes mellitus, type 2) (Jade [name])   Anemia   Dyslipidemia   Hypothyroidism   Discharge Condition: stable  Diet recommendation: heart healthy  Filed Weights   11/30/21 1747  Weight: 43.8 kg    History of present illness:  Jade [name] is Jade [name] 88 y.o. female with history of diabetes mellitus type 2, hypertension, hypothyroidism, hyperlipidemia, CAD has been experiencing left lower quadrant pain for almost Atharv Barriere month which patient states comes and goes.  Was recently placed on antibiotics by patient's primary care physician.  Patient states while she was taking antibiotics her pain had improved but has come back again. In the ER patient had CT abdomen pelvis which shows features concerning for colitis. She had flex sig which showed sigmoid colitis.  Path showed minimally active colitis concerning for SCAD over IBD.  Plan for discharge on abx with GI follow up.  See below for additional details    Hospital Course:  Assessment and Plan: Colitis CT with colitis extending from splenic flexure through mid sigmoid colon  S/p flex sig with localized inflammation in the sigmoid colon, ddx includes ischemia, SCAD, infection, IBD Path with chronic minimally active colitis, will follow up with GI Appreciate GI recs -> concerning for SCAD over IBD, planning for PO abx and outpatient GI follow up  Essential  hypertension Amlodipine HCTZ and spironolactone on hold - follow outpatient for ? Of resumption  DM2 (diabetes mellitus, type 2) (Jade [name]) Resume home metformin  Anemia Continue to monitor  Dyslipidemia lipitor  Hypothyroidism synthroid     Procedures: Flex sig  - Diverticulosis in the sigmoid colon and in the descending colon. - Localized inflammation was found in the sigmoid colon. Biopsied. - Non-bleeding internal hemorrhoids. Impression: - Return patient to hospital ward for ongoing care. - Inflammation was seen in the sigmoid colon. Differential still includes ischemia, SCAD, infection, and/or IBD. - Await pathology results. - The findings and recommendations were discussed with the patient and/or primary team.  Consultations: GI  Discharge Exam: Vitals:   12/03/21 2034 12/04/21 0700  BP: (!) 109/59 (!) 146/68  Pulse: 65 62  Resp: 18 18  Temp: (!) 97.4 F (36.3 C) 98 F (36.7 C)  SpO2:  98%   Discussed discharge plan She notes friends will be picking her up  General: No acute distress. Cardiovascular: RRR Lungs: unlabored Abdomen: Soft, nontender, nondistended Neurological: Alert and oriented 3. Moves all extremities 4 with equal strength. Cranial nerves II through XII grossly intact. Extremities: No clubbing or cyanosis. No edema.  Discharge Instructions   Discharge Instructions     Call MD for:  difficulty breathing, headache or visual disturbances   Complete by: As directed    Call MD for:  extreme fatigue   Complete by: As directed    Call MD for:  hives   Complete by: As directed    Call MD for:  persistant dizziness or light-headedness   Complete by: As directed    Call MD for:  persistant nausea and vomiting   Complete by: As directed    Call MD for:  redness, tenderness, or signs of infection (pain, swelling, redness, odor or green/yellow discharge around incision site)   Complete by: As directed    Call MD for:  severe uncontrolled  pain   Complete by: As directed    Call MD for:  temperature >100.4   Complete by: As directed    Diet - low sodium heart healthy   Complete by: As directed    Discharge instructions   Complete by: As directed    You were seen for colitis.  You had Christapher Gillian flexible sigmoidoscopy which showed localized inflammation in the sigmoid colon, non bleeding internal hemorrhoids, and diverticulosis.  Biopsy of the inflammation of the sigmoid colon showed chronic, minimally active colitis.  This is concerning for segmental colitis associated with diverticulosis.  We're going to treat this with antibiotics.  You should follow up with gastroenterology as an outpatient.  Continue your amlodipine.  We're currently holding your spironolactone and HCTZ.  Don't resume these until you follow up with your PCP outpatient.  Return for new, recurrent, or worsening symptoms.  Please ask your PCP to request records from this hospitalization so they know what was done and what the next steps will be.   Increase activity slowly   Complete by: As directed       Allergies as of 12/04/2021       Reactions   Codeine Nausea And Vomiting   Pt saw flashing lights   Miralax [polyethylene Glycol] Swelling   Feet swelling   Talwin [pentazocine] Nausea And Vomiting   Patient experiences Flashing lights   Ultram [tramadol] Nausea And Vomiting, Other (See Comments)   Pt saw flashing lights        Medication List     STOP taking these medications    acetaminophen 500 MG tablet Commonly known as: TYLENOL   hydrochlorothiazide 12.5 MG tablet Commonly known as: HYDRODIURIL   spironolactone 25 MG tablet Commonly known as: ALDACTONE       TAKE these medications    amLODipine 2.5 MG tablet Commonly known as: NORVASC Take 2.5 mg by mouth daily.   atorvastatin 10 MG tablet Commonly known as: LIPITOR Take 5 mg by mouth at bedtime.   ciprofloxacin 500 MG tablet Commonly known as: CIPRO Take 1 tablet (500  mg total) by mouth daily for 5 days. Start taking on: December 05, 2021 What changed:  when to take this additional instructions   COQ10 PO Take 1 capsule by mouth daily.   FISH OIL PO Take 1 capsule by mouth daily.   lactulose 10 GM/15ML solution Commonly known as: CHRONULAC Take 10 g by mouth daily as needed for constipation.   levothyroxine 75 MCG tablet Commonly known as: SYNTHROID Take 75 mcg by mouth daily.   MELATONIN PO Take 1 tablet by mouth at bedtime as needed (sleep).   metFORMIN 500 MG tablet Commonly known as: GLUCOPHAGE Take 1 tablet (500 mg total) by mouth daily with breakfast.   metroNIDAZOLE 500 MG tablet Commonly known as: FLAGYL Take 1 tablet (500 mg total) by mouth every 8 (eight) hours for 5 days.   pantoprazole 40 MG tablet Commonly known as: PROTONIX TAKE 1 TABLET(40 MG) BY MOUTH DAILY   PreserVision AREDS 2 Caps Take 1 capsule by mouth 2 (two) times daily.   sertraline 25 MG tablet Commonly known as: ZOLOFT Take 25 mg by mouth daily.   timolol 0.5 %  ophthalmic solution Commonly known as: TIMOPTIC Place 1 drop into both eyes 2 (two) times daily.   VITAMIN D-3 PO Take 1 capsule by mouth at bedtime.       Allergies  Allergen Reactions   Codeine Nausea And Vomiting    Pt saw flashing lights   Miralax [Polyethylene Glycol] Swelling    Feet swelling   Talwin [Pentazocine] Nausea And Vomiting    Patient experiences Flashing lights   Ultram [Tramadol] Nausea And Vomiting and Other (See Comments)    Pt saw flashing lights      The results of significant diagnostics from this hospitalization (including imaging, microbiology, ancillary and laboratory) are listed below for reference.    Significant Diagnostic Studies: CT ABDOMEN PELVIS W CONTRAST  Result Date: 11/30/2021 CLINICAL DATA:  Acute abdominal pain. Left lower quadrant pain. Concern for diverticulitis. EXAM: CT ABDOMEN AND PELVIS WITH CONTRAST TECHNIQUE: Multidetector CT  imaging of the abdomen and pelvis was performed using the standard protocol following bolus administration of intravenous contrast. RADIATION DOSE REDUCTION: This exam was performed according to the departmental dose-optimization program which includes automated exposure control, adjustment of the mA and/or kV according to patient size and/or use of iterative reconstruction technique. CONTRAST:  55m OMNIPAQUE IOHEXOL 300 MG/ML  SOLN COMPARISON:  CT 10/27/2020 FINDINGS: Lower chest: Scattered atelectasis. No confluent consolidation or pleural effusion. Hepatobiliary: No focal hepatic abnormality. Chronic intrahepatic biliary ductal dilatation postcholecystectomy. Normal caliber common bile duct. Pancreas: No ductal dilatation or inflammation. Spleen: Normal in size without focal abnormality. Adrenals/Urinary Tract: Normal adrenal glands. No hydronephrosis or perinephric edema. Homogeneous renal enhancement with symmetric excretion on delayed phase imaging. There is simple bilateral renal cysts, needing no further follow-up. Mild left greater than right renal parenchymal thinning. No renal calculi or solid renal lesion. Urinary bladder is physiologically distended without wall thickening. Stomach/Bowel: There is colonic wall thickening and pericolonic edema involving the long length segment of the colon extending from the splenic flexure through the mid sigmoid colon. There multiple diverticula in this region, however no focally inflamed diverticulitis, and length is more typical of colitis. No bowel pneumatosis or perforation. Additional areas of wall thickening versus nondistention of the proximal transverse colon. There is no small bowel obstruction or inflammation. Unremarkable appearance of the stomach. Vascular/Lymphatic: Advanced aortic atherosclerosis. No aneurysm. No acute vascular findings. Patent portal vein. No abdominopelvic adenopathy. Reproductive: Status post hysterectomy. No adnexal masses. Other: No  perforation or free air. No ascites or focal fluid collection. No abdominal wall hernia. Musculoskeletal: Degenerative change in the lumbar spine with multilevel degenerative disc disease and prominent lower lumbar facet hypertrophy. There are no acute or suspicious osseous abnormalities. IMPRESSION: 1. Colitis extending from the splenic flexure through the mid sigmoid colon. There are multiple diverticula in this region, however no focally inflamed diverticulitis, and length is more typical of colitis. No perforation or abscess. 2. Additional areas of wall thickening versus nondistention of the proximal transverse colon. Aortic Atherosclerosis (ICD10-I70.0). Electronically Signed   By: MKeith RakeM.D.   On: 11/30/2021 22:46   DG Cervical Spine 2 or 3 views  Result Date: 11/13/2021 CLINICAL DATA:  Left-sided neck pain with popping sensation for 3-4 days EXAM: CERVICAL SPINE - 2-3 VIEW COMPARISON:  None Available. FINDINGS: On the lateral view the cervical spine is visualized to the level of T1. There is Teyon Odette normal cervical lordosis. Pre-vertebral soft tissues are within normal limits. No fracture is detected in the cervical spine. Dens is well positioned between the lateral  masses of C1. Advanced cervical spondylosis with ankylosis of C1-C3 and C4-C7. Advanced disc space height loss at C3-C4 likely with partial ankylosis at this level. Advanced cervical facet arthropathy. Grade 1 anterolisthesis C7 on T1. IMPRESSION: No radiographic evidence of fracture in the cervical spine. Advanced cervical spondylosis with near complete ankylosis of the cervical vertebral bodies. Electronically Signed   By: Placido Sou M.D.   On: 11/13/2021 10:32    Microbiology: No results found for this or any previous visit (from the past 240 hour(s)).   Labs: Basic Metabolic Panel: Recent Labs  Lab 11/30/21 1752 12/01/21 0421 12/02/21 1000 12/03/21 0227 12/04/21 0137  NA 137 137 140 135 134*  K 3.8 3.5 4.2 4.2 4.0   CL 101 108 108 104 106  CO2 '24 24 27 25 23  '$ GLUCOSE 93 147* 102* 142* 108*  BUN 14 10 6* 7* 13  CREATININE 0.96 0.85 1.14* 1.03* 1.17*  CALCIUM 10.8* 9.6 9.9 9.8 9.3  MG  --   --   --  1.4* 2.0  PHOS  --   --   --  2.6 2.6   Liver Function Tests: Recent Labs  Lab 11/30/21 1752 12/01/21 0421 12/02/21 1000 12/03/21 0227 12/04/21 0137  AST '17 18 18 15 19  '$ ALT '16 18 17 16 17  '$ ALKPHOS 80 64 74 72 59  BILITOT 0.7 0.8 0.9 0.6 0.7  PROT 7.7 5.9* 6.5 6.0* 6.0*  ALBUMIN 4.0 2.7* 2.8* 2.6* 2.5*   Recent Labs  Lab 11/30/21 1752  LIPASE 77*   No results for input(s): "AMMONIA" in the last 168 hours. CBC: Recent Labs  Lab 11/30/21 1752 12/01/21 0421 12/02/21 1000 12/03/21 0227 12/04/21 0137  WBC 10.6* 8.2 9.2 12.4* 12.0*  NEUTROABS  --  5.3 6.4 10.9* 8.9*  HGB 11.3* 10.5* 10.7* 9.9* 9.8*  HCT 34.7* 31.3* 33.5* 30.0* 29.5*  MCV 91.3 93.4 93.1 92.3 92.2  PLT 283 241 246 234 244   Cardiac Enzymes: No results for input(s): "CKTOTAL", "CKMB", "CKMBINDEX", "TROPONINI" in the last 168 hours. BNP: BNP (last 3 results) No results for input(s): "BNP" in the last 8760 hours.  ProBNP (last 3 results) No results for input(s): "PROBNP" in the last 8760 hours.  CBG: Recent Labs  Lab 12/03/21 0826 12/03/21 1133 12/03/21 1638 12/03/21 2237 12/04/21 0836  GLUCAP 115* 169* 72 103* 71       Signed:  Fayrene Helper MD.  [facility] 12/04/2021, 10:05 AM

## 2021-12-04 NOTE — Telephone Encounter (Signed)
Patient is scheduled for hospital f/u on 01/05/22 at 3:00 pm with Nevin Bloodgood, NP. Pt is currently admitted, follow up date will appear on discharge paperwork.

## 2021-12-04 NOTE — Evaluation (Signed)
Physical Therapy Evaluation Patient Details Name: Jade [name] MRN: [id number] DOB: 21-Aug-1927 Today's Date: 12/04/2021  History of Present Illness  Patient is a 88 y/o female who presents on 8/7 for abdominal pain. Found to have colitis. PMh includes CKD, DM, HTN, CAD.  Clinical Impression  Patient presents with generalized weakness and impaired balance s/p above. Pt lives alone and is independent for ADLs/IADls and drives PTA. Reports no falls. Today, pt tolerated ambulation and stair training with Min guard-supervision for safety due to staggering in both directions esp with head turns but no overt LOB. Reports feeling weak from being in the hospital and not moving. Encouraged use of RW at home temporarily until balance improves. Will follow acutely to maximize independence and mobility prior to return home.       Recommendations for follow up therapy are one component of a multi-disciplinary discharge planning process, led by the attending physician.  Recommendations may be updated based on patient status, additional functional criteria and insurance authorization.  Follow Up Recommendations No PT follow up      Assistance Recommended at Discharge Intermittent Supervision/Assistance  Patient can return home with the following  Assist for transportation;Assistance with cooking/housework;Help with stairs or ramp for entrance;A little help with walking and/or transfers    Equipment Recommendations None recommended by PT  Recommendations for Other Services       Functional Status Assessment Patient has had a recent decline in their functional status and demonstrates the ability to make significant improvements in function in a reasonable and predictable amount of time.     Precautions / Restrictions Precautions Precautions: Fall Restrictions Weight Bearing Restrictions: No      Mobility  Bed Mobility               General bed mobility comments: Getting off BSC upon PT  arrival.    Transfers Overall transfer level: Needs assistance Equipment used: None Transfers: Sit to/from Stand Sit to Stand: Supervision           General transfer comment: Supervision for safety. Stood from [facility] x1, from EOB x1.    Ambulation/Gait Ambulation/Gait assistance: Supervision Gait Distance (Feet): 200 Feet Assistive device: None Gait Pattern/deviations: Step-through pattern, Decreased stride length, Staggering left, Staggering right Gait velocity: decreased Gait velocity interpretation: <1.31 ft/sec, indicative of household ambulator   General Gait Details: Slow, staggering gait esp with head turns needing Min guard progressing to supervision but no overt LOB, declined using RW. Encouraged to use it at home temporarily.  Stairs Stairs: Yes Stairs assistance: Min guard Stair Management: One rail Right, Alternating pattern Number of Stairs: 3 General stair comments: Cues for technique and safety. Alternating step pattern noted.  Wheelchair Mobility    Modified Rankin (Stroke Patients Only)       Balance Overall balance assessment: Needs assistance Sitting-balance support: Feet supported, No upper extremity supported Sitting balance-Leahy Scale: Good     Standing balance support: During functional activity Standing balance-Leahy Scale: Fair                               Pertinent Vitals/Pain Pain Assessment Pain Assessment: No/denies pain    Home Living Family/patient expects to be discharged to:: Private residence Living Arrangements: Alone Available Help at Discharge: Friend(s);Available PRN/intermittently Type of Home: House Home Access: Stairs to enter Entrance Stairs-Rails:  (post) Entrance Stairs-Number of Steps: 2   Home Layout: One level Home Equipment: Conservation officer, nature (2 wheels);Cane -  single point      Prior Function Prior Level of Function : Independent/Modified Independent             Mobility Comments:  Drives, no falls, does IADLs ADLs Comments: independent     Hand Dominance   Dominant Hand: Right    Extremity/Trunk Assessment   Upper Extremity Assessment Upper Extremity Assessment: Defer to OT evaluation RUE Deficits / Details: limited right shoulder AROM due to rotator cuff tear    Lower Extremity Assessment Lower Extremity Assessment: Generalized weakness (but functional)    Cervical / Trunk Assessment Cervical / Trunk Assessment: Kyphotic  Communication   Communication: No difficulties  Cognition Arousal/Alertness: Awake/alert Behavior During Therapy: WFL for tasks assessed/performed Overall Cognitive Status: Within Functional Limits for tasks assessed                                          General Comments      Exercises     Assessment/Plan    PT Assessment Patient needs continued PT services  PT Problem List Decreased strength;Decreased mobility;Decreased balance;Decreased range of motion       PT Treatment Interventions Therapeutic activities;Gait training;Stair training;Balance training;Therapeutic exercise;Patient/family education    PT Goals (Current goals can be found in the Care Plan section)  Acute Rehab PT Goals Patient Stated Goal: to go home PT Goal Formulation: With patient Time For Goal Achievement: 12/18/21 Potential to Achieve Goals: Good    Frequency Min 3X/week     Co-evaluation               AM-PAC PT "6 Clicks" Mobility  Outcome Measure Help needed turning from your back to your side while in a flat bed without using bedrails?: None Help needed moving from lying on your back to sitting on the side of a flat bed without using bedrails?: None Help needed moving to and from a bed to a chair (including a wheelchair)?: A Little Help needed standing up from a chair using your arms (e.g., wheelchair or bedside chair)?: A Little Help needed to walk in hospital room?: A Little Help needed climbing 3-5 steps  with a railing? : A Little 6 Click Score: 20    End of Session Equipment Utilized During Treatment: Gait belt Activity Tolerance: Patient tolerated treatment well Patient left: in bed;with call bell/phone within reach (sitting EOB) Nurse Communication: Mobility status PT Visit Diagnosis: Unsteadiness on feet (R26.81);Muscle weakness (generalized) (M62.81);Difficulty in walking, not elsewhere classified (R26.2)    Time: 1008-1020 PT Time Calculation (min) (ACUTE ONLY): 12 min   Charges:   PT Evaluation $PT Eval Moderate Complexity: 1 Mod          Marisa Severin, PT, DPT Acute Rehabilitation Services Secure chat preferred Office [phone]     Marguarite Arbour A Reilly Molchan 12/04/2021, 11:52 AM

## 2021-12-04 NOTE — Telephone Encounter (Signed)
-----   Message from Sharyn Creamer, MD sent at 12/03/2021  7:44 PM EDT ----- Macario Golds, please arrange for clinic follow up with Dr. Fuller Plan or APP in 1 month for SCAD. Thanks.

## 2021-12-04 NOTE — Plan of Care (Signed)
  Problem: Education: Goal: Knowledge of General Education information will improve Description: Including pain rating scale, medication(s)/side effects and non-pharmacologic comfort measures Outcome: Progressing   Problem: Clinical Measurements: Goal: Respiratory complications will improve Outcome: Progressing   Problem: Activity: Goal: Risk for activity intolerance will decrease Outcome: Progressing   Problem: Nutrition: Goal: Adequate nutrition will be maintained Outcome: Progressing   Problem: Coping: Goal: Level of anxiety will decrease Outcome: Progressing   Problem: Pain Managment: Goal: General experience of comfort will improve Outcome: Progressing   Problem: Fluid Volume: Goal: Ability to maintain a balanced intake and output will improve Outcome: Progressing   Problem: Nutritional: Goal: Maintenance of adequate nutrition will improve Outcome: Progressing

## 2021-12-05 ENCOUNTER — Encounter (HOSPITAL_COMMUNITY): Payer: Self-pay | Admitting: Internal Medicine

## 2021-12-07 LAB — GLUCOSE, CAPILLARY: Glucose-Capillary: 295 mg/dL — ABNORMAL HIGH (ref 70–99)

## 2021-12-22 ENCOUNTER — Encounter: Payer: Self-pay | Admitting: Podiatry

## 2021-12-22 ENCOUNTER — Ambulatory Visit: Payer: Medicare Other | Admitting: Podiatry

## 2021-12-22 DIAGNOSIS — B351 Tinea unguium: Secondary | ICD-10-CM

## 2021-12-22 DIAGNOSIS — M79675 Pain in left toe(s): Secondary | ICD-10-CM

## 2021-12-22 DIAGNOSIS — E1149 Type 2 diabetes mellitus with other diabetic neurological complication: Secondary | ICD-10-CM

## 2021-12-22 DIAGNOSIS — M2012 Hallux valgus (acquired), left foot: Secondary | ICD-10-CM

## 2021-12-22 DIAGNOSIS — M2011 Hallux valgus (acquired), right foot: Secondary | ICD-10-CM | POA: Diagnosis not present

## 2021-12-22 DIAGNOSIS — M792 Neuralgia and neuritis, unspecified: Secondary | ICD-10-CM | POA: Diagnosis not present

## 2021-12-22 DIAGNOSIS — M79674 Pain in right toe(s): Secondary | ICD-10-CM

## 2021-12-22 NOTE — Progress Notes (Signed)
This patient returns to my office for at risk foot care.  This patient requires this care by a professional since this patient will be at risk due to having diabetes type 2 and CKD.  This patient is unable to cut nails himself since the patient cannot reach his nails.These nails are painful walking and wearing shoes.  This patient presents for at risk foot care today.  General Appearance  Alert, conversant and in no acute stress.  Vascular  Dorsalis pedis and posterior tibial  pulses are weakly  palpable  bilaterally.  Capillary return is within normal limits  bilaterally. Temperature is within normal limits  bilaterally.  Neurologic  Senn-Weinstein monofilament wire test dimiinshed   bilaterally. Muscle power within normal limits bilaterally.  Nails Thick disfigured discolored nails with subungual debris  from hallux to fifth toes bilaterally. No evidence of bacterial infection or drainage bilaterally.  Orthopedic  No limitations of motion  feet .  No crepitus or effusions noted.  No bony pathology or digital deformities noted.  HAV  B/L  Skin  normotropic skin with no porokeratosis noted bilaterally.  No signs of infections or ulcers noted.     Onychomycosis  Pain in right toes  Pain in left toes  Consent was obtained for treatment procedures.   Mechanical debridement of nails 1-5  bilaterally performed with a nail nipper.  Filed with dremel without incident.    Return office visit  10  weeks                   Told patient to return for periodic foot care and evaluation due to potential at risk complications.   Chrislyn Seedorf DPM   

## 2021-12-23 ENCOUNTER — Ambulatory Visit: Payer: Medicare Other | Admitting: Cardiology

## 2021-12-23 ENCOUNTER — Encounter: Payer: Self-pay | Admitting: Cardiology

## 2021-12-23 VITALS — BP 139/71 | HR 67 | Temp 98.6°F | Resp 16 | Ht 59.0 in | Wt 92.4 lb

## 2021-12-23 DIAGNOSIS — I5032 Chronic diastolic (congestive) heart failure: Secondary | ICD-10-CM | POA: Insufficient documentation

## 2021-12-23 DIAGNOSIS — I5033 Acute on chronic diastolic (congestive) heart failure: Secondary | ICD-10-CM

## 2021-12-23 DIAGNOSIS — I10 Essential (primary) hypertension: Secondary | ICD-10-CM

## 2021-12-23 MED ORDER — EMPAGLIFLOZIN 10 MG PO TABS: 10.0000 mg | ORAL_TABLET | Freq: Every day | ORAL | 3 refills | Status: DC

## 2021-12-23 NOTE — Progress Notes (Signed)
Follow up visit  Subjective:   Jade [name], female    DOB: [birth year], 88 y.o.   MRN: [id number]     HPI   Chief Complaint  Patient presents with   Shortness of Breath         88 year old Caucasian female with hypertension, type 2 diabetes mellitus, hyperlipidemia, coronary artery disease, HFpEF, Bell's palsy  Patient was recently hospitalized with colitis, presumed infectious. Since then, she has had significant deconditioning. She also reports leg swelling and exertional dyspnea, denies chest pain.     Current Outpatient Medications:    atorvastatin (LIPITOR) 10 MG tablet, Take 5 mg by mouth at bedtime., Disp: , Rfl:    Cholecalciferol (VITAMIN D-3 PO), Take 1 capsule by mouth at bedtime., Disp: , Rfl:    Coenzyme Q10 (COQ10 PO), Take 1 capsule by mouth daily., Disp: , Rfl:    lactulose (CHRONULAC) 10 GM/15ML solution, Take 10 g by mouth daily as needed for constipation., Disp: , Rfl:    levothyroxine (SYNTHROID) 75 MCG tablet, Take 75 mcg by mouth daily., Disp: , Rfl:    metFORMIN (GLUCOPHAGE) 500 MG tablet, Take 1 tablet (500 mg total) by mouth daily with breakfast., Disp: , Rfl:    Multiple Vitamins-Minerals (PRESERVISION AREDS 2) CAPS, Take 1 capsule by mouth 2 (two) times daily., Disp: , Rfl:    Omega-3 Fatty Acids (FISH OIL PO), Take 1 capsule by mouth daily., Disp: , Rfl:    spironolactone (ALDACTONE) 50 MG tablet, Take 50 mg by mouth daily., Disp: , Rfl:    timolol (TIMOPTIC) 0.5 % ophthalmic solution, Place 1 drop into both eyes 2 (two) times daily. , Disp: , Rfl: 11   amLODipine (NORVASC) 2.5 MG tablet, Take 2.5 mg by mouth daily. , Disp: , Rfl:     Cardiovascular & other pertient studies:  EKG 12/23/2021: Sinus rhythm 64 bpm First degree A-V block   Echocardiogram 05/23/2018: Left ventricle cavity is normal in size. Mild concentric hypertrophy of the left ventricle. Normal global wall motion. Doppler evidence of grade I (impaired) diastolic  dysfunction, elevated LAP. Calculated EF 67%. Left atrial cavity is mild to moderately dilated, LA volume of 5.0 cm. Mild (Grade I) mitral regurgitation. Mild tricuspid regurgitation. Mild pulmonary hypertension.  PAS pressure estimated at 31 mmHg, CVP 3 mmHg. Compared to the study done in 07/23/2010, left atrial size was previously normal, mild pulmonary hypertension is new.   Lexiscan myoview stress test 05/22/2018: 1. Lexiscan stress test was performed. Exercise capacity was not assessed. Stress symptoms included headache. Resting blood pressure was 160/78 mmHg and peak effect blood pressure was 128/56 mmHg. The resting and stress electrocardiogram demonstrated normal sinus rhythm, normal resting conduction, possible old anteroseptal infarct, no resting arrhythmias and normal rest repolarization.  Stress EKG is non diagnostic for ischemia as it is a pharmacologic stress. 2. The overall quality of the study is good. There is no evidence of abnormal lung activity. LV cavity is small. Stress SPECT images demonstrate homogeneous tracer distribution throughout the myocardium. Small inferior apical perfusion defect seen on rest images only likely represent tissue attenuation artifact. Gated SPECT imaging reveals normal myocardial thickening and wall motion. The left ventricular ejection fraction was normal (75%).   3. Low risk study.  Recent labs: 12/04/2021: Glucose 108, BUN/Cr 13/1.17. EGFR 44. Na/K 134/4. Albumin 2.5. Rest of the CMP normal H/H 9.8/29.5. MCV 92. Platelets 244      Review of Systems  Cardiovascular:  Positive for dyspnea on exertion and  leg swelling. Negative for chest pain, palpitations and syncope.         Vitals:   12/23/21 1322  BP: 139/71  Pulse: 67  Resp: 16  Temp: 98.6 F (37 C)  SpO2: 99%    Body mass index is 18.66 kg/m. Filed Weights   12/23/21 1322  Weight: 92 lb 6.4 oz (41.9 kg)     Objective:   Physical Exam Vitals and nursing note reviewed.   Constitutional:      General: She is not in acute distress. Neck:     Vascular: No JVD.  Cardiovascular:     Rate and Rhythm: Normal rate and regular rhythm.     Heart sounds: Normal heart sounds. No murmur heard. Pulmonary:     Effort: Pulmonary effort is normal.     Breath sounds: Normal breath sounds. No wheezing or rales.  Musculoskeletal:     Right lower leg: Edema (1+) present.     Left lower leg: Edema (1+) present.           Assessment & Recommendations:   88 year old Caucasian female with hypertension, type 2 diabetes mellitus, hyperlipidemia, coronary artery disease, HFpEF, Bell's palsy  CAD: Coronary calcification without ischemia on stress test.  Given her age and overall clinical stability, as well as anemia, not on Aspirin  HFpEF: Prior BNP elevation, exertional dyspnea and leg edema. Check echocardiogram, BNP Add Jardiance 10 mg daily. To avoid hypotension, reduce spironolactone to 25 mg daily.   F/u in 4 weeks    Nigel Mormon, MD Pager: [phone] Office: [phone]

## 2021-12-24 LAB — BRAIN NATRIURETIC PEPTIDE: BNP: 88.5 pg/mL (ref 0.0–100.0)

## 2021-12-25 ENCOUNTER — Telehealth: Payer: Self-pay

## 2021-12-29 NOTE — Telephone Encounter (Signed)
Tried calling patient no answer left a vm

## 2021-12-29 NOTE — Telephone Encounter (Signed)
Can hold off Jardiance for now.   Thanks MJP

## 2021-12-30 NOTE — Telephone Encounter (Signed)
Patient called back and is aware of results.

## 2022-01-05 ENCOUNTER — Encounter: Payer: Self-pay | Admitting: Nurse Practitioner

## 2022-01-05 ENCOUNTER — Ambulatory Visit: Payer: Medicare Other | Admitting: Nurse Practitioner

## 2022-01-05 VITALS — BP 122/62 | HR 72 | Ht 58.27 in

## 2022-01-05 DIAGNOSIS — K5900 Constipation, unspecified: Secondary | ICD-10-CM | POA: Diagnosis not present

## 2022-01-05 DIAGNOSIS — K50119 Crohn's disease of large intestine with unspecified complications: Secondary | ICD-10-CM | POA: Diagnosis not present

## 2022-01-05 DIAGNOSIS — K648 Other hemorrhoids: Secondary | ICD-10-CM | POA: Diagnosis not present

## 2022-01-05 NOTE — Progress Notes (Signed)
Chief Complaint:  hospital follow up   [name]   #88 year old female recently hospitalized with constipation / segmental colitis.  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 Treated with antibiotics in hospital, didn't continue them upon discharge ( she wasn't aware she was supposed to continue them but wouldn't have anyway due to muscle pain related to cipro). Still having intermittent LLQ and constipation.    Treat constipation.  No response to MiraLAX, lactulose nor fiber.  She did have a good response to Dulcolax tablets.  Recommend trial of 1 Senokot and a stool softener at bedtime.   I asked her to call office in a couple of weeks with a condition update.  If constipation improved but LLQ pain persists then consider mesalamine for SCAD though might need to adjust dose given CKD  #Intolerance to Cipro due to muscle weakness  #History of adenomatous colon polyps.  Last colonoscopy in 2012 with removal of a 1 mm tubular adenoma.  She is no longer in polyp surveillance program due to age  # Internal hemorrhoids with burning.  Prep H suppositories twice daily for 10 days Treat constipation   HPI   Jade [name] is a 88 y.o. female known to Dr. Fuller Plan with a past medical history significant for diabetes, CAD, CKD stage III, hyperlipidemia, hypertension, hypothyroidism, glaucoma, GERD, colon polyps, diverticulitis cholecystectomy, appendectomy, TAH with BSO. See PMH /PSH for additional history   Jade [name] was hospitalized in early August 2023.  We saw her in consultation 12/01/2021 for evaluation of LLQ pain and constipation.  Records reviewed. CT scan showing colitis extending from the splenic flexure through the mid-sigmoid colon with multiple diverticula in this region. No diverticulitis noted. She underwent a flexible sigmoidoscopy revealing localized inflammation of the sigmoid colon.  Pathology  suggested SCAD over IBD.  Her abdominal pain resolved . She was eventually transitioned from IV antibiotics to Cipro/Flagyl. She was discharged on cipro and flagyl but says she didn't get the prescriptions ( didn't know about them) but would not have taken Cipro anyway due to muscle weakness.   Interval History:  She is still having constipation and intermittent LLQ pain. The pain does get better after a bowel movement. Miralax doesn't help constipation, she tried it for a week and didn't have one BM. She took lactulose for days but that didn't help either. She took Citrucel for years but it stopped working. Recently took two Dulcolax and had a good response but scared to take it on a regular basis. She has some samples of what she thinks is linzess but hasn't tried it. Currently she is not taking anything for her bowels. She drinks drinks sugar free, caffeine free drinks and some water throughout the day.   Previous GI Evaluation   Flexible sigmoidoscopy 12/02/21: - Diverticulosis in the sigmoid colon and in the descending colon. - Localized inflammation was found in the sigmoid colon. Biopsied. - Non-bleeding internal hemorrhoids. Path: A. LEFT COLON, BIOPSY:  Chronic, minimally active colitis (see comment)  COMMENT:  Sections show multiple fragments of colonic mucosa exhibiting patchy  variable architectural distortion in the form of elongated tortuous  dilated branching and budding crypts in the lamina propria showing a  variable and focally mixed mononuclear cell infiltrate including  neutrophils which very focally infiltrate the crypt and surface  epithelium.  No basal plasmacytosis or foreshortening is appreciated.  There is no increase in intraepithelial lymphocytes and the collagen  table is of normal thickness.  No granulomas or parasites are seen.  There is no evidence of dysplasia or carcinoma. Overall these findings are suggestive of a segmental diverticular disease associated  colitis over idiopathic inflammatory bowel disease.  Clinical and endoscopic correlation is recommended   Imaging     Labs:     Latest Ref Rng & Units 12/04/2021    1:37 AM 12/03/2021    2:27 AM 12/02/2021   10:00 AM  CBC  WBC 4.0 - 10.5 K/uL 12.0  12.4  9.2   Hemoglobin 12.0 - 15.0 g/dL 9.8  9.9  10.7   Hematocrit 36.0 - 46.0 % 29.5  30.0  33.5   Platelets 150 - 400 K/uL 244  234  246        Latest Ref Rng & Units 12/04/2021    1:37 AM 12/03/2021    2:27 AM 12/02/2021   10:00 AM  Hepatic Function  Total Protein 6.5 - 8.1 g/dL 6.0  6.0  6.5   Albumin 3.5 - 5.0 g/dL 2.5  2.6  2.8   AST 15 - 41 U/L $Remo'19  15  18   'FwRyr$ ALT 0 - 44 U/L $Remo'17  16  17   'hMNsm$ Alk Phosphatase 38 - 126 U/L 59  72  74   Total Bilirubin 0.3 - 1.2 mg/dL 0.7  0.6  0.9      Past Medical History:  Diagnosis Date   Allergic rhinitis    Bronchitis    Cataract    CKD (chronic kidney disease)    stage 3 kidney failure per pt   Diabetes mellitus    Diverticulosis 11/2010   GERD (gastroesophageal reflux disease)    Glaucoma    Hyperlipidemia    Hypertension    Hypothyroid    Internal hemorrhoids    Tubular adenoma polyp of rectum 02/2002   Type 2 diabetes mellitus ([place])     Past Surgical History:  Procedure Laterality Date   APPENDECTOMY     BIOPSY  12/02/2021   Procedure: BIOPSY;  Surgeon: Sharyn Creamer, MD;  Location: [facility] ENDOSCOPY;  Service: Gastroenterology;;   BREAST CYST EXCISION     CHOLECYSTECTOMY     COLONOSCOPY     DIVERTICULITIS  2004   SURGERY FOR DIVERTICULITIS PER PT.   FLEXIBLE SIGMOIDOSCOPY N/A 12/02/2021   Procedure: FLEXIBLE SIGMOIDOSCOPY;  Surgeon: Sharyn Creamer, MD;  Location: [place];  Service: Gastroenterology;  Laterality: N/A;   HERNIA REPAIR     w/ mesh   POLYPECTOMY     TONSILLECTOMY     TOTAL ABDOMINAL HYSTERECTOMY W/ BILATERAL SALPINGOOPHORECTOMY      Current Medications, Allergies, Family History and Social History were reviewed in [employer]  record.     Current Outpatient Medications  Medication Sig Dispense Refill   amLODipine (NORVASC) 2.5 MG tablet Take 2.5 mg by mouth daily.      atorvastatin (LIPITOR) 10 MG tablet Take 5 mg by mouth at bedtime.     Cholecalciferol (VITAMIN D-3 PO) Take 1 capsule by mouth at bedtime.     Coenzyme Q10 (COQ10 PO) Take 1 capsule by mouth daily.     empagliflozin (JARDIANCE) 10 MG TABS tablet Take 1 tablet (10 mg total) by mouth daily before breakfast. 30 tablet 3   lactulose (CHRONULAC) 10 GM/15ML solution Take 10 g by mouth daily as needed for constipation.     levothyroxine (SYNTHROID) 75 MCG tablet Take 75 mcg by mouth daily.  metFORMIN (GLUCOPHAGE) 500 MG tablet Take 1 tablet (500 mg total) by mouth daily with breakfast.     Multiple Vitamins-Minerals (PRESERVISION AREDS 2) CAPS Take 1 capsule by mouth 2 (two) times daily.     Omega-3 Fatty Acids (FISH OIL PO) Take 1 capsule by mouth daily.     spironolactone (ALDACTONE) 50 MG tablet Take 25 mg by mouth daily.     timolol (TIMOPTIC) 0.5 % ophthalmic solution Place 1 drop into both eyes 2 (two) times daily.   11   No current facility-administered medications for this visit.    Review of Systems: No chest pain. No shortness of breath. No urinary complaints.    Physical Exam  Wt Readings from Last 3 Encounters:  12/23/21 92 lb 6.4 oz (41.9 kg)  11/30/21 96 lb 9 oz (43.8 kg)  11/12/21 96 lb 9.6 oz (43.8 kg)    BP 122/62   Pulse 72   Ht 4' 10.27" (1.48 m)   SpO2 98%   BMI 19.13 kg/m  Constitutional:  Generally well appearing female in no acute distress. Psychiatric: Pleasant. Normal mood and affect. Behavior is normal. EENT: Pupils normal.  Conjunctivae are normal. No scleral icterus. Neck supple.  Cardiovascular: Normal rate, regular rhythm. No edema Pulmonary/chest: Effort normal and breath sounds normal. No wheezing, rales or rhonchi. Abdominal: Soft, nondistended, nontender. Bowel sounds active throughout. There are no  masses palpable. No hepatomegaly. Neurological: Alert and oriented to person place and time. Skin: Skin is warm and dry. No rashes noted.  Tye Savoy, NP  01/05/2022, 2:47 PM

## 2022-01-05 NOTE — Patient Instructions (Addendum)
Prep H suppositories twice daily for 10 days to treat hemorrhoids Take one Senokot nightly for constipation Take one Colace nightly for constipation Call us in 2 weeks with an update. Ask to speak with RN Berniece Salines. Phone number is [phone]  _______________________________________________________  If you are age 86 or older, your body mass index should be between 23-30. Your Body mass index is 19.13 kg/m. If this is out of the aforementioned range listed, please consider follow up with your Primary Care Provider.  If you are age 76 or younger, your body mass index should be between 19-25. Your Body mass index is 19.13 kg/m. If this is out of the aformentioned range listed, please consider follow up with your Primary Care Provider.   ________________________________________________________  The [facility] providers would like to encourage you to use [facility] to communicate with providers for non-urgent requests or questions.  Due to long hold times on the telephone, sending your provider a message by [facility] may be a faster and more efficient way to get a response.  Please allow 48 business hours for a response.  Please remember that this is for non-urgent requests.  _______________________________________________________  It was a pleasure to see you today!  Thank you for trusting me with your gastrointestinal care!

## 2022-01-20 ENCOUNTER — Telehealth: Payer: Self-pay | Admitting: Nurse Practitioner

## 2022-01-20 NOTE — Telephone Encounter (Signed)
Inbound call from patient states the rx colace and senokot has not been helping her. Please advise.

## 2022-01-21 NOTE — Telephone Encounter (Signed)
Left message for pt to call back  °

## 2022-01-21 NOTE — Telephone Encounter (Signed)
Pt called with an update. Pt reports that the senokot and stool softener are not helping her constipation. Pt denies abdominal pain except when she is trying to have a bowel movement. Pt states that her last bowel movement was Sunday 9/24. Pt also reports that usually dulcolax works for her but states she knows she is not supposed to take that all the time.

## 2022-01-25 NOTE — Telephone Encounter (Signed)
Spoke with pt and gave pt recommendations. Pt verbalized understanding and had no other concerns at end of call.

## 2022-01-26 ENCOUNTER — Ambulatory Visit: Payer: Medicare Other

## 2022-01-26 DIAGNOSIS — I5033 Acute on chronic diastolic (congestive) heart failure: Secondary | ICD-10-CM

## 2022-02-03 ENCOUNTER — Encounter: Payer: Self-pay | Admitting: Cardiology

## 2022-02-03 ENCOUNTER — Ambulatory Visit: Payer: Medicare Other | Admitting: Cardiology

## 2022-02-03 VITALS — BP 149/72 | HR 65 | Temp 98.0°F | Resp 16 | Ht <= 58 in | Wt 93.8 lb

## 2022-02-03 DIAGNOSIS — R600 Localized edema: Secondary | ICD-10-CM

## 2022-02-03 DIAGNOSIS — I5032 Chronic diastolic (congestive) heart failure: Secondary | ICD-10-CM

## 2022-02-03 MED ORDER — FUROSEMIDE 20 MG PO TABS: 20.0000 mg | ORAL_TABLET | Freq: Every day | ORAL | 3 refills | Status: AC | PRN

## 2022-02-03 NOTE — Progress Notes (Signed)
Follow up visit  Subjective:   Jade [name], female    DOB: [birth year], 88 y.o.   MRN: [id number]    HPI   Chief Complaint  Patient presents with   Acute on chronic heart failure with preserved ejection frac   Follow-up    88 year old Caucasian female with hypertension, type 2 diabetes mellitus, hyperlipidemia, coronary artery disease, HFpEF, Bell's palsy  Barring occasional leg edema and cough, she is doing well. Her BNP was normal. She does not want to take Jardiance.      Current Outpatient Medications:    atorvastatin (LIPITOR) 10 MG tablet, Take 5 mg by mouth at bedtime., Disp: , Rfl:    Cholecalciferol (VITAMIN D-3 PO), Take 1 capsule by mouth at bedtime., Disp: , Rfl:    Coenzyme Q10 (COQ10 PO), Take 1 capsule by mouth daily., Disp: , Rfl:    levothyroxine (SYNTHROID) 75 MCG tablet, Take 75 mcg by mouth daily., Disp: , Rfl:    metFORMIN (GLUCOPHAGE) 500 MG tablet, Take 1 tablet (500 mg total) by mouth daily with breakfast., Disp: , Rfl:    Multiple Vitamins-Minerals (PRESERVISION AREDS 2) CAPS, Take 1 capsule by mouth 2 (two) times daily., Disp: , Rfl:    Omega-3 Fatty Acids (FISH OIL PO), Take 1 capsule by mouth daily., Disp: , Rfl:    spironolactone (ALDACTONE) 50 MG tablet, Take 25 mg by mouth daily., Disp: , Rfl:    timolol (TIMOPTIC) 0.5 % ophthalmic solution, Place 1 drop into both eyes 2 (two) times daily. , Disp: , Rfl: 11    Cardiovascular & other pertient studies:  Echocardiogram 01/27/2022:  Left ventricle cavity is normal in size and wall thickness. Normal global  wall motion. Normal LV systolic function with EF 59%. Doppler evidence of  grade I (impaired) diastolic dysfunction, normal LAP.  Left atrial cavity is mildly dilated.  Mild (Grade I) mitral regurgitation.  Moderate to severe tricuspid regurgitation. Estimated PASP 30 mmHg. Previous study in 2020 reported mild TR. No other significant change.   EKG 12/23/2021: Sinus rhythm 64  bpm First degree A-V block   Echocardiogram 05/23/2018: Left ventricle cavity is normal in size. Mild concentric hypertrophy of the left ventricle. Normal global wall motion. Doppler evidence of grade I (impaired) diastolic dysfunction, elevated LAP. Calculated EF 67%. Left atrial cavity is mild to moderately dilated, LA volume of 5.0 cm. Mild (Grade I) mitral regurgitation. Mild tricuspid regurgitation. Mild pulmonary hypertension.  PAS pressure estimated at 31 mmHg, CVP 3 mmHg. Compared to the study done in 07/23/2010, left atrial size was previously normal, mild pulmonary hypertension is new.   Lexiscan myoview stress test 05/22/2018: 1. Lexiscan stress test was performed. Exercise capacity was not assessed. Stress symptoms included headache. Resting blood pressure was 160/78 mmHg and peak effect blood pressure was 128/56 mmHg. The resting and stress electrocardiogram demonstrated normal sinus rhythm, normal resting conduction, possible old anteroseptal infarct, no resting arrhythmias and normal rest repolarization.  Stress EKG is non diagnostic for ischemia as it is a pharmacologic stress. 2. The overall quality of the study is good. There is no evidence of abnormal lung activity. LV cavity is small. Stress SPECT images demonstrate homogeneous tracer distribution throughout the myocardium. Small inferior apical perfusion defect seen on rest images only likely represent tissue attenuation artifact. Gated SPECT imaging reveals normal myocardial thickening and wall motion. The left ventricular ejection fraction was normal (75%).   3. Low risk study.  Recent labs: 12/23/2021: BNP 88, normal  12/04/2021: Glucose  108, BUN/Cr 13/1.17. EGFR 44. Na/K 134/4. Albumin 2.5. Rest of the CMP normal H/H 9.8/29.5. MCV 92. Platelets 244      Review of Systems  Cardiovascular:  Positive for dyspnea on exertion and leg swelling. Negative for chest pain, palpitations and syncope.         Vitals:    02/03/22 1306  BP: (!) 149/72  Pulse: 65  Resp: 16  Temp: 98 F (36.7 C)  SpO2: 99%    Body mass index is 19.6 kg/m. Filed Weights   02/03/22 1306  Weight: 93 lb 12.8 oz (42.5 kg)     Objective:   Physical Exam Vitals and nursing note reviewed.  Constitutional:      General: She is not in acute distress. Neck:     Vascular: No JVD.  Cardiovascular:     Rate and Rhythm: Normal rate and regular rhythm.     Heart sounds: Normal heart sounds. No murmur heard. Pulmonary:     Effort: Pulmonary effort is normal.     Breath sounds: Normal breath sounds. No wheezing or rales.  Musculoskeletal:     Right lower leg: Edema (1+) present.     Left lower leg: Edema (1+) present.           Assessment & Recommendations:   88 year old Caucasian female with hypertension, type 2 diabetes mellitus, hyperlipidemia, coronary artery disease, HFpEF, Bell's palsy  CAD: Coronary calcification without ischemia on stress test.  Given her age and overall clinical stability, as well as anemia, not on Aspirin  HFpEF: Clinically euvolemic. She is currently on spironolactone 50 mg daily. Does not want to take Jardiance. Okay to use as needed lasix.   F/u in 3 months    Nigel Mormon, MD Pager: [phone] Office: [phone]

## 2022-03-03 ENCOUNTER — Encounter: Payer: Self-pay | Admitting: Podiatry

## 2022-03-03 ENCOUNTER — Ambulatory Visit: Payer: Medicare Other | Admitting: Podiatry

## 2022-03-03 DIAGNOSIS — M2012 Hallux valgus (acquired), left foot: Secondary | ICD-10-CM

## 2022-03-03 DIAGNOSIS — M79674 Pain in right toe(s): Secondary | ICD-10-CM | POA: Diagnosis not present

## 2022-03-03 DIAGNOSIS — M79675 Pain in left toe(s): Secondary | ICD-10-CM

## 2022-03-03 DIAGNOSIS — B351 Tinea unguium: Secondary | ICD-10-CM

## 2022-03-03 DIAGNOSIS — E1149 Type 2 diabetes mellitus with other diabetic neurological complication: Secondary | ICD-10-CM

## 2022-03-03 DIAGNOSIS — M2011 Hallux valgus (acquired), right foot: Secondary | ICD-10-CM

## 2022-03-03 NOTE — Progress Notes (Signed)
This patient returns to my office for at risk foot care.  This patient requires this care by a professional since this patient will be at risk due to having diabetes type 2 and CKD.  This patient is unable to cut nails himself since the patient cannot reach his nails.These nails are painful walking and wearing shoes.  This patient presents for at risk foot care today.  General Appearance  Alert, conversant and in no acute stress.  Vascular  Dorsalis pedis and posterior tibial  pulses are weakly  palpable  bilaterally.  Capillary return is within normal limits  bilaterally. Temperature is within normal limits  bilaterally.  Neurologic  Senn-Weinstein monofilament wire test dimiinshed   bilaterally. Muscle power within normal limits bilaterally.  Nails Thick disfigured discolored nails with subungual debris  from hallux to fifth toes bilaterally. No evidence of bacterial infection or drainage bilaterally.  Orthopedic  No limitations of motion  feet .  No crepitus or effusions noted.  No bony pathology or digital deformities noted.  HAV  B/L  Skin  normotropic skin with no porokeratosis noted bilaterally.  No signs of infections or ulcers noted.     Onychomycosis  Pain in right toes  Pain in left toes  Consent was obtained for treatment procedures.   Mechanical debridement of nails 1-5  bilaterally performed with a nail nipper.  Filed with dremel without incident.    Return office visit  10  weeks                   Told patient to return for periodic foot care and evaluation due to potential at risk complications.   Gardiner Barefoot DPM

## 2022-03-22 ENCOUNTER — Telehealth: Payer: Self-pay

## 2022-03-22 NOTE — Telephone Encounter (Signed)
Patient calling to ask Dr. Virgina Jock if he is okay with her driving. Per Dr. Virgina Jock, from a cardiac stand point there is nothing he can tell her no to driving for but that he recommends she use extreme cause if she must drive herself anywhere. Patient acknowledged understanding.

## 2022-05-12 ENCOUNTER — Ambulatory Visit: Payer: Medicare Other | Admitting: Cardiology

## 2022-05-14 ENCOUNTER — Encounter: Payer: Self-pay | Admitting: Cardiology

## 2022-05-14 ENCOUNTER — Ambulatory Visit: Payer: Medicare Other | Admitting: Cardiology

## 2022-05-14 VITALS — BP 156/80 | HR 62 | Resp 16 | Ht <= 58 in | Wt 91.0 lb

## 2022-05-14 DIAGNOSIS — I5032 Chronic diastolic (congestive) heart failure: Secondary | ICD-10-CM

## 2022-05-14 DIAGNOSIS — I10 Essential (primary) hypertension: Secondary | ICD-10-CM

## 2022-05-14 NOTE — Progress Notes (Signed)
Follow up visit  Subjective:   Jade [name], female    DOB: [birth year], 89 y.o.   MRN: [id number]    HPI   Chief Complaint  Patient presents with   Congestive Heart Failure   Follow-up    3 month    89 year old Caucasian female with hypertension, type 2 diabetes mellitus, hyperlipidemia, coronary artery disease, HFpEF, Bell's palsy  She has mild. Unchanged exertional dyspnea with more than usual activity. Leg edema has resolved. She has occasional lightheadedness. Blood pressure elevated today, but much lower at home.     Current Outpatient Medications:    aspirin 81 MG chewable tablet, Chew 81 mg by mouth daily., Disp: , Rfl:    atorvastatin (LIPITOR) 10 MG tablet, Take 5 mg by mouth at bedtime., Disp: , Rfl:    azelastine (ASTELIN) 0.1 % nasal spray, Place 1 spray into both nostrils 2 (two) times daily., Disp: , Rfl:    Cholecalciferol (VITAMIN D-3 PO), Take 1 capsule by mouth at bedtime., Disp: , Rfl:    Coenzyme Q10 (COQ10 PO), Take 1 capsule by mouth daily., Disp: , Rfl:    furosemide (LASIX) 20 MG tablet, Take 1 tablet (20 mg total) by mouth daily as needed., Disp: 30 tablet, Rfl: 3   levothyroxine (SYNTHROID) 75 MCG tablet, Take 75 mcg by mouth daily., Disp: , Rfl:    Melatonin CR (MELADOX) 3 MG TBCR, Take 1 tablet by mouth as needed., Disp: , Rfl:    metFORMIN (GLUCOPHAGE) 500 MG tablet, Take 1 tablet (500 mg total) by mouth daily with breakfast., Disp: , Rfl:    Multiple Vitamins-Minerals (PRESERVISION AREDS 2) CAPS, Take 1 capsule by mouth 2 (two) times daily., Disp: , Rfl:    Omega-3 Fatty Acids (FISH OIL PO), Take 1 capsule by mouth daily., Disp: , Rfl:    senna (SENOKOT) 8.6 MG tablet, Take 1 tablet by mouth daily., Disp: , Rfl:    spironolactone (ALDACTONE) 50 MG tablet, Take 25 mg by mouth daily., Disp: , Rfl:    timolol (TIMOPTIC) 0.5 % ophthalmic solution, Place 1 drop into both eyes 2 (two) times daily. , Disp: , Rfl: 11    Cardiovascular & other  pertient studies:  EKG 05/14/2022: Sinus rhythm 64 bpm First degree A-V block  Low voltage   Echocardiogram 01/27/2022:  Left ventricle cavity is normal in size and wall thickness. Normal global  wall motion. Normal LV systolic function with EF 59%. Doppler evidence of  grade I (impaired) diastolic dysfunction, normal LAP.  Left atrial cavity is mildly dilated.  Mild (Grade I) mitral regurgitation.  Moderate to severe tricuspid regurgitation. Estimated PASP 30 mmHg. Previous study in 2020 reported mild TR. No other significant change.   EKG 12/23/2021: Sinus rhythm 64 bpm First degree A-V block   Echocardiogram 05/23/2018: Left ventricle cavity is normal in size. Mild concentric hypertrophy of the left ventricle. Normal global wall motion. Doppler evidence of grade I (impaired) diastolic dysfunction, elevated LAP. Calculated EF 67%. Left atrial cavity is mild to moderately dilated, LA volume of 5.0 cm. Mild (Grade I) mitral regurgitation. Mild tricuspid regurgitation. Mild pulmonary hypertension.  PAS pressure estimated at 31 mmHg, CVP 3 mmHg. Compared to the study done in 07/23/2010, left atrial size was previously normal, mild pulmonary hypertension is new.   Lexiscan myoview stress test 05/22/2018: 1. Lexiscan stress test was performed. Exercise capacity was not assessed. Stress symptoms included headache. Resting blood pressure was 160/78 mmHg and peak effect blood pressure was 128/56  mmHg. The resting and stress electrocardiogram demonstrated normal sinus rhythm, normal resting conduction, possible old anteroseptal infarct, no resting arrhythmias and normal rest repolarization.  Stress EKG is non diagnostic for ischemia as it is a pharmacologic stress. 2. The overall quality of the study is good. There is no evidence of abnormal lung activity. LV cavity is small. Stress SPECT images demonstrate homogeneous tracer distribution throughout the myocardium. Small inferior apical perfusion  defect seen on rest images only likely represent tissue attenuation artifact. Gated SPECT imaging reveals normal myocardial thickening and wall motion. The left ventricular ejection fraction was normal (75%).   3. Low risk study.  Recent labs: 12/23/2021: BNP 88, normal  12/04/2021: Glucose 108, BUN/Cr 13/1.17. EGFR 44. Na/K 134/4. Albumin 2.5. Rest of the CMP normal H/H 9.8/29.5. MCV 92. Platelets 244      Review of Systems  Cardiovascular:  Positive for dyspnea on exertion and leg swelling. Negative for chest pain, palpitations and syncope.         Vitals:   05/14/22 1330 05/14/22 1331  BP: (!) 160/83 (!) 156/80  Pulse: (!) 45 62  Resp: 16   SpO2: (!) 89% 93%    Body mass index is 19.02 kg/m. Filed Weights   05/14/22 1330  Weight: 91 lb (41.3 kg)     Objective:   Physical Exam Vitals and nursing note reviewed.  Constitutional:      General: She is not in acute distress. Neck:     Vascular: No JVD.  Cardiovascular:     Rate and Rhythm: Normal rate and regular rhythm.     Heart sounds: Normal heart sounds. No murmur heard. Pulmonary:     Effort: Pulmonary effort is normal.     Breath sounds: Normal breath sounds. No wheezing or rales.  Musculoskeletal:     Right lower leg: Edema (1+) present.     Left lower leg: Edema (1+) present.           Assessment & Recommendations:   89 year old Caucasian female with hypertension, type 2 diabetes mellitus, hyperlipidemia, coronary artery disease, HFpEF, Bell's palsy  CAD: Coronary calcification without ischemia on stress test.  Given her age and overall clinical stability, as well as anemia, okay to omit Aspirin  HFpEF: Clinically euvolemic. She is currently on spironolactone 50 mg daily. Given lightheadedness, reduce spironolctone to 25 mg daily. Okay to use as needed lasix.   F/u in 3 months    Nigel Mormon, MD Pager: [phone] Office: [phone]

## 2022-05-15 ENCOUNTER — Encounter: Payer: Self-pay | Admitting: Cardiology

## 2022-06-08 ENCOUNTER — Ambulatory Visit: Payer: Medicare Other | Admitting: Podiatry

## 2022-06-08 ENCOUNTER — Encounter: Payer: Self-pay | Admitting: Podiatry

## 2022-06-08 DIAGNOSIS — M79675 Pain in left toe(s): Secondary | ICD-10-CM | POA: Diagnosis not present

## 2022-06-08 DIAGNOSIS — E1149 Type 2 diabetes mellitus with other diabetic neurological complication: Secondary | ICD-10-CM

## 2022-06-08 DIAGNOSIS — M2012 Hallux valgus (acquired), left foot: Secondary | ICD-10-CM

## 2022-06-08 DIAGNOSIS — M2011 Hallux valgus (acquired), right foot: Secondary | ICD-10-CM | POA: Diagnosis not present

## 2022-06-08 DIAGNOSIS — B351 Tinea unguium: Secondary | ICD-10-CM

## 2022-06-08 DIAGNOSIS — M79674 Pain in right toe(s): Secondary | ICD-10-CM

## 2022-06-08 NOTE — Progress Notes (Signed)
This patient returns to my office for at risk foot care.  This patient requires this care by a professional since this patient will be at risk due to having diabetes type 2 and CKD.  This patient is unable to cut nails himself since the patient cannot reach his nails.These nails are painful walking and wearing shoes.  This patient presents for at risk foot care today.  General Appearance  Alert, conversant and in no acute stress.  Vascular  Dorsalis pedis and posterior tibial  pulses are weakly  palpable  bilaterally.  Capillary return is within normal limits  bilaterally. Temperature is within normal limits  bilaterally.  Neurologic  Senn-Weinstein monofilament wire test dimiinshed   bilaterally. Muscle power within normal limits bilaterally.  Nails Thick disfigured discolored nails with subungual debris  from hallux to fifth toes bilaterally. No evidence of bacterial infection or drainage bilaterally.  Orthopedic  No limitations of motion  feet .  No crepitus or effusions noted.  No bony pathology or digital deformities noted.  HAV  B/L  Skin  normotropic skin with no porokeratosis noted bilaterally.  No signs of infections or ulcers noted.     Onychomycosis  Pain in right toes  Pain in left toes  Consent was obtained for treatment procedures.   Mechanical debridement of nails 1-5  bilaterally performed with a nail nipper.  Filed with dremel without incident.    Return office visit  12   weeks                   Told patient to return for periodic foot care and evaluation due to potential at risk complications.  Patient qualifies for diabetic shoes due to DPN and HAV  B/L.   Gardiner Barefoot DPM

## 2022-06-16 ENCOUNTER — Ambulatory Visit (INDEPENDENT_AMBULATORY_CARE_PROVIDER_SITE_OTHER): Payer: Medicare Other

## 2022-06-16 DIAGNOSIS — E1149 Type 2 diabetes mellitus with other diabetic neurological complication: Secondary | ICD-10-CM

## 2022-06-16 DIAGNOSIS — M2012 Hallux valgus (acquired), left foot: Secondary | ICD-10-CM

## 2022-06-16 DIAGNOSIS — M2011 Hallux valgus (acquired), right foot: Secondary | ICD-10-CM

## 2022-06-16 NOTE — Progress Notes (Signed)
Patient presents to the office today for diabetic shoe and insole measuring.  Patient was measured with brannock device to determine size and width for 1 pair of extra depth shoes and foam casted for 3 pair of insoles.   ABN signed.   Documentation of medical necessity will be sent to patient's treating diabetic doctor to verify and sign.   Patient's diabetic provider: Deland Pretty  NPI: [id number]   Shoes and insoles will be ordered at that time and patient will be notified for an appointment for fitting when they arrive.    Patient shoe selection-   1st   Shoe choice:   [id number]  Shoe size ordered: 6.5 W

## 2022-07-19 ENCOUNTER — Encounter: Payer: Self-pay | Admitting: Podiatry

## 2022-09-07 ENCOUNTER — Encounter: Payer: Self-pay | Admitting: Podiatry

## 2022-09-07 ENCOUNTER — Ambulatory Visit: Payer: Medicare Other | Admitting: Podiatry

## 2022-09-07 DIAGNOSIS — B351 Tinea unguium: Secondary | ICD-10-CM

## 2022-09-07 DIAGNOSIS — M79674 Pain in right toe(s): Secondary | ICD-10-CM | POA: Diagnosis not present

## 2022-09-07 DIAGNOSIS — M79675 Pain in left toe(s): Secondary | ICD-10-CM | POA: Diagnosis not present

## 2022-09-07 DIAGNOSIS — E1149 Type 2 diabetes mellitus with other diabetic neurological complication: Secondary | ICD-10-CM

## 2022-09-07 NOTE — Progress Notes (Signed)
This patient returns to my office for at risk foot care.  This patient requires this care by a professional since this patient will be at risk due to having diabetes type 2 and CKD.  This patient is unable to cut nails himself since the patient cannot reach his nails.These nails are painful walking and wearing shoes.  This patient presents for at risk foot care today.  General Appearance  Alert, conversant and in no acute stress.  Vascular  Dorsalis pedis and posterior tibial  pulses are weakly  palpable  bilaterally.  Capillary return is within normal limits  bilaterally. Temperature is within normal limits  bilaterally.  Neurologic  Senn-Weinstein monofilament wire test dimiinshed   bilaterally. Muscle power within normal limits bilaterally.  Nails Thick disfigured discolored nails with subungual debris  from hallux to fifth toes bilaterally. No evidence of bacterial infection or drainage bilaterally.  Orthopedic  No limitations of motion  feet .  No crepitus or effusions noted.  No bony pathology or digital deformities noted.  HAV  B/L  Skin  normotropic skin with no porokeratosis noted bilaterally.  No signs of infections or ulcers noted.     Onychomycosis  Pain in right toes  Pain in left toes  Consent was obtained for treatment procedures.   Mechanical debridement of nails 1-5  bilaterally performed with a nail nipper.  Filed with dremel without incident.    Return office visit  12   weeks                   Told patient to return for periodic foot care and evaluation due to potential at risk complications.  Patient qualifies for diabetic shoes due to DPN and HAV  B/L.   Sair Faulcon DPM   

## 2022-09-21 ENCOUNTER — Encounter (HOSPITAL_COMMUNITY): Payer: Self-pay

## 2022-09-21 ENCOUNTER — Emergency Department (HOSPITAL_COMMUNITY): Payer: Medicare Other

## 2022-09-21 ENCOUNTER — Other Ambulatory Visit: Payer: Self-pay

## 2022-09-21 ENCOUNTER — Emergency Department (HOSPITAL_COMMUNITY)
Admission: EM | Admit: 2022-09-21 | Discharge: 2022-09-21 | Disposition: A | Discharge status: Home / Self Care | Payer: Medicare Other | Attending: Emergency Medicine | Admitting: Emergency Medicine

## 2022-09-21 DIAGNOSIS — M25519 Pain in unspecified shoulder: Secondary | ICD-10-CM | POA: Diagnosis not present

## 2022-09-21 DIAGNOSIS — R42 Dizziness and giddiness: Secondary | ICD-10-CM | POA: Insufficient documentation

## 2022-09-21 DIAGNOSIS — Z79899 Other long term (current) drug therapy: Secondary | ICD-10-CM | POA: Diagnosis not present

## 2022-09-21 DIAGNOSIS — M62838 Other muscle spasm: Secondary | ICD-10-CM | POA: Diagnosis not present

## 2022-09-21 DIAGNOSIS — N3000 Acute cystitis without hematuria: Secondary | ICD-10-CM | POA: Diagnosis not present

## 2022-09-21 DIAGNOSIS — M436 Torticollis: Secondary | ICD-10-CM | POA: Diagnosis not present

## 2022-09-21 LAB — URINALYSIS, ROUTINE W REFLEX MICROSCOPIC
Bilirubin Urine: NEGATIVE
Glucose, UA: NEGATIVE mg/dL
Hgb urine dipstick: NEGATIVE
Ketones, ur: NEGATIVE mg/dL
Nitrite: POSITIVE — AB
Protein, ur: NEGATIVE mg/dL
Specific Gravity, Urine: 1.006 (ref 1.005–1.030)
WBC, UA: >50 WBC/hpf (ref 0–5)
pH: 5 (ref 5.0–8.0)

## 2022-09-21 LAB — CBG MONITORING, ED: Glucose-Capillary: 110 mg/dL — ABNORMAL HIGH (ref 70–99)

## 2022-09-21 LAB — CBC
HCT: 34.1 % — ABNORMAL LOW (ref 36.0–46.0)
Hemoglobin: 10.9 g/dL — ABNORMAL LOW (ref 12.0–15.0)
MCH: 30.4 pg (ref 26.0–34.0)
MCHC: 32 g/dL (ref 30.0–36.0)
MCV: 95 fL (ref 80.0–100.0)
Platelets: 354 10*3/uL (ref 150–400)
RBC: 3.59 MIL/uL — ABNORMAL LOW (ref 3.87–5.11)
RDW: 13.9 % (ref 11.5–15.5)
WBC: 9.5 10*3/uL (ref 4.0–10.5)
nRBC: 0 % (ref 0.0–0.2)

## 2022-09-21 LAB — BASIC METABOLIC PANEL
Anion gap: 8 (ref 5–15)
BUN: 40 mg/dL — ABNORMAL HIGH (ref 8–23)
CO2: 23 mmol/L (ref 22–32)
Calcium: 10.1 mg/dL (ref 8.9–10.3)
Chloride: 102 mmol/L (ref 98–111)
Creatinine, Ser: 1.08 mg/dL — ABNORMAL HIGH (ref 0.44–1.00)
GFR, Estimated: 48 mL/min — ABNORMAL LOW (ref >60)
Glucose, Bld: 120 mg/dL — ABNORMAL HIGH (ref 70–99)
Potassium: 5 mmol/L (ref 3.5–5.1)
Sodium: 133 mmol/L — ABNORMAL LOW (ref 135–145)

## 2022-09-21 MED ORDER — MECLIZINE HCL 25 MG PO TABS
12.5000 mg | ORAL_TABLET | Freq: Once | ORAL | Status: AC
  Administered 2022-09-21: 12.5 mg via ORAL
  Filled 2022-09-21: qty 1

## 2022-09-21 MED ORDER — MIDAZOLAM HCL 2 MG/2ML IJ SOLN
1.0000 mg | Freq: Once | INTRAMUSCULAR | Status: AC
  Administered 2022-09-21: 0.25 mg via INTRAVENOUS
  Filled 2022-09-21: qty 2

## 2022-09-21 MED ORDER — DIAZEPAM 5 MG/ML IJ SOLN
5.0000 mg | Freq: Once | INTRAMUSCULAR | Status: AC | PRN
  Administered 2022-09-21: 5 mg via INTRAVENOUS
  Filled 2022-09-21: qty 2

## 2022-09-21 MED ORDER — MECLIZINE HCL 12.5 MG PO TABS: 12.5000 mg | ORAL_TABLET | Freq: Three times a day (TID) | ORAL | 0 refills | Status: AC | PRN

## 2022-09-21 MED ORDER — SODIUM CHLORIDE 0.9 % IV SOLN
1.0000 g | Freq: Once | INTRAVENOUS | Status: AC
  Administered 2022-09-21: 1 g via INTRAVENOUS
  Filled 2022-09-21: qty 10

## 2022-09-21 MED ORDER — IOHEXOL 350 MG/ML SOLN
75.0000 mL | Freq: Once | INTRAVENOUS | Status: AC | PRN
  Administered 2022-09-21: 75 mL via INTRAVENOUS

## 2022-09-21 MED ORDER — CEPHALEXIN 500 MG PO CAPS: 500.0000 mg | ORAL_CAPSULE | Freq: Three times a day (TID) | ORAL | 0 refills | Status: DC

## 2022-09-21 MED ORDER — SODIUM CHLORIDE 0.9 % IV BOLUS
500.0000 mL | Freq: Once | INTRAVENOUS | Status: AC
  Administered 2022-09-21: 500 mL via INTRAVENOUS

## 2022-09-21 MED ORDER — LIDOCAINE 5 % EX PTCH: 1.0000 | MEDICATED_PATCH | CUTANEOUS | 0 refills | Status: AC

## 2022-09-21 NOTE — ED Notes (Addendum)
During ortho VS, pt became dizzy upon initially lying down and stayed dizzy. The dizzy spell started to go away, once the pt sat back up. Initially standing the became very dizzy again and HR increased up to 122 and back down within a minute. Pt was no longer very dizzy only slightly and stopped being dizzy after a minute of standing. HR remained between 58-64 for the rest of the time.

## 2022-09-21 NOTE — ED Provider Notes (Signed)
[place] EMERGENCY DEPARTMENT AT [facility] Provider Note   CSN: [id number] Arrival date & time: 09/21/22  1501     History  Chief Complaint  Patient presents with   Dizziness   Torticollis   Shoulder Pain    Jade [name] is a 89 y.o. female here presenting with neck pain and shoulder pain and dizziness.  Patient has been having dizziness and headache for the last 2 to 3 months.  Patient states that it is worse when she bends over and tries to get up.  Patient states that over the last 3 days her symptoms got worse.  Patient states that she noticed that her neck muscles are more stiff than usual.  Denies any fevers or headache.  Denies any previous history of stroke.  Denies any focal weakness or numbness.  The history is provided by the patient and a relative.       Home Medications Prior to Admission medications   Medication Sig Start Date End Date Taking? Authorizing Provider  atorvastatin (LIPITOR) 10 MG tablet Take 5 mg by mouth at bedtime.    [provider]  azelastine (ASTELIN) 0.1 % nasal spray Place 1 spray into both nostrils 2 (two) times daily. 02/25/22   [provider]  Cholecalciferol (VITAMIN D-3 PO) Take 1 capsule by mouth at bedtime.    [provider]  Coenzyme Q10 (COQ10 PO) Take 1 capsule by mouth daily.    [provider]  furosemide (LASIX) 20 MG tablet Take 1 tablet (20 mg total) by mouth daily as needed. 02/03/22 06/03/22  Patwardhan, Anabel Bene, MD  levothyroxine (SYNTHROID) 75 MCG tablet Take 75 mcg by mouth daily.    [provider]  Melatonin CR (MELADOX) 3 MG TBCR Take 1 tablet by mouth as needed.    [provider]  metFORMIN (GLUCOPHAGE) 500 MG tablet Take 1 tablet (500 mg total) by mouth daily with breakfast. 04/28/18   Ghimire, Werner Lean, MD  Multiple Vitamins-Minerals (PRESERVISION AREDS 2) CAPS Take 1 capsule by mouth 2 (two) times daily.    [provider]  Omega-3 Fatty  Acids (FISH OIL PO) Take 1 capsule by mouth daily.    [provider]  senna (SENOKOT) 8.6 MG tablet Take 1 tablet by mouth daily.    [provider]  spironolactone (ALDACTONE) 50 MG tablet Take 25 mg by mouth daily. 12/15/21   [provider]  timolol (TIMOPTIC) 0.5 % ophthalmic solution Place 1 drop into both eyes 2 (two) times daily.  08/26/14   [provider]      Allergies    Codeine, Miralax [polyethylene glycol], Tramadol, Ciprofibrate, Ciprofloxacin hcl, and Pentazocine    Review of Systems   Review of Systems  Neurological:  Positive for dizziness.  All other systems reviewed and are negative.   Physical Exam Updated Vital Signs BP 134/71 (BP Location: Right Arm)   Pulse 61   Temp 98 F (36.7 C) (Oral)   Resp 16   SpO2 98%  Physical Exam Vitals and nursing note reviewed.  Constitutional:      Comments: Chronically ill but well-appearing for age  HENT:     Head: Normocephalic.     Nose: Nose normal.     Mouth/Throat:     Mouth: Mucous membranes are moist.  Eyes:     Extraocular Movements: Extraocular movements intact.     Pupils: Pupils are equal, round, and reactive to light.  Neck:  Comments: Patient has bilateral para cervical tenderness.  No obvious midline tenderness.  No meningeal signs Cardiovascular:     Rate and Rhythm: Normal rate and regular rhythm.     Pulses: Normal pulses.  Pulmonary:     Effort: Pulmonary effort is normal.     Breath sounds: Normal breath sounds.  Abdominal:     General: Abdomen is flat.     Palpations: Abdomen is soft.  Musculoskeletal:        General: Normal range of motion.  Skin:    General: Skin is warm.     Capillary Refill: Capillary refill takes less than 2 seconds.  Neurological:     General: No focal deficit present.     Mental Status: She is oriented to person, place, and time.     Comments: Patient has no obvious nystagmus.  No obvious facial droop.  Patient has normal  strength bilateral arms and legs.  Psychiatric:        Mood and Affect: Mood normal.        Behavior: Behavior normal.     ED Results / Procedures / Treatments   Labs (all labs ordered are listed, but only abnormal results are displayed) Labs Reviewed  BASIC METABOLIC PANEL - Abnormal; Notable for the following components:      Result Value   Sodium 133 (*)    Glucose, Bld 120 (*)    BUN 40 (*)    Creatinine, Ser 1.08 (*)    GFR, Estimated 48 (*)    All other components within normal limits  CBC - Abnormal; Notable for the following components:   RBC 3.59 (*)    Hemoglobin 10.9 (*)    HCT 34.1 (*)    All other components within normal limits  CBG MONITORING, ED - Abnormal; Notable for the following components:   Glucose-Capillary 110 (*)    All other components within normal limits  URINALYSIS, ROUTINE W REFLEX MICROSCOPIC    EKG None  Radiology No results found.  Procedures Procedures    Medications Ordered in ED Medications  meclizine (ANTIVERT) tablet 12.5 mg (has no administration in time range)  diazepam (VALIUM) injection 5 mg (has no administration in time range)    ED Course/ Medical Decision Making/ A&P                             Medical Decision Making Jade [name] is a 89 y.o. female here presenting with dizziness and neck pain.  Patient likely has torticollis causing her neck pain.  Patient has no meningeal signs and I do not think she has meningitis.  I think likely peripheral vertigo.  However given her age and risk factors, concern for stroke as well.  Will get MRI brain.  Will also get CTA head and neck.  Will give meclizine and reassess.  9 pm Patient was given Valium before MRI and she desatted to the 60s.  I was called in the room and patient was placed on a nonrebreather and started breathing more now.  Will go to MRI when she is less sleepy.  10:54 PM Labs showed UTI.  Patient's MRI showed no stroke.  I discussed with family.   Daughter is comfortable taking her home with antibiotics and meclizine.  I think her neck pain is likely muscle spasms and I hesitate to give her muscle relaxants so we will try lidocaine patch.  Problems Addressed: Acute cystitis without hematuria:  acute illness or injury Dizziness: acute illness or injury Neck muscle spasm: acute illness or injury  Amount and/or Complexity of Data Reviewed Radiology: ordered and independent interpretation performed. Decision-making details documented in ED Course.  Risk Prescription drug management.    Final Clinical Impression(s) / ED Diagnoses Final diagnoses:  None    Rx / DC Orders ED Discharge Orders     None         Charlynne Pander, MD 09/21/22 2255

## 2022-09-21 NOTE — ED Triage Notes (Signed)
C/o intermittent blurred vision and dizziness that is worse when bending over or looking up and down x3 months.  Also c/o neck pain radiating into bilateral shoulders.

## 2022-09-21 NOTE — Discharge Instructions (Addendum)
You have urinary tract infection and that is likely causing your dizziness.  I recommend taking Keflex 500 mg 3 times daily  You can also take meclizine 12.5 mg 3 times daily as needed for dizziness  Please use lidocaine patch to your neck to help you with neck spasms  Please follow-up with your doctor   Return to ER if you have worse dizziness, neck pain, numbness or weakness

## 2022-11-10 ENCOUNTER — Ambulatory Visit: Payer: Medicare Other | Admitting: Cardiology

## 2022-11-12 ENCOUNTER — Encounter: Payer: Self-pay | Admitting: Cardiology

## 2022-11-12 ENCOUNTER — Ambulatory Visit: Payer: Medicare Other | Admitting: Cardiology

## 2022-11-12 VITALS — BP 133/61 | HR 64 | Resp 16 | Ht <= 58 in | Wt 91.4 lb

## 2022-11-12 DIAGNOSIS — R0609 Other forms of dyspnea: Secondary | ICD-10-CM

## 2022-11-12 NOTE — Progress Notes (Signed)
Follow up visit  Subjective:   Jade [name], female    DOB: 09-Aug-1927, 89 y.o.   MRN: [id number]    HPI   Chief Complaint  Patient presents with   Chronic heart failure with preserved ejection fraction   Follow-up    6 months    89 year old Caucasian female with hypertension, type 2 diabetes mellitus, hyperlipidemia, coronary artery disease, prior HFpEF, Bell's palsy  Patient denies chest pain, but continues to have exertional dyspnea with walking 120 ft. Recent BNP was normal. Blood pressure is well controlled.   Current Outpatient Medications:    atorvastatin (LIPITOR) 10 MG tablet, Take 5 mg by mouth at bedtime., Disp: , Rfl:    azelastine (ASTELIN) 0.1 % nasal spray, Place 1 spray into both nostrils 2 (two) times daily., Disp: , Rfl:    cephALEXin (KEFLEX) 500 MG capsule, Take 1 capsule (500 mg total) by mouth 3 (three) times daily., Disp: 21 capsule, Rfl: 0   Cholecalciferol (VITAMIN D-3 PO), Take 1 capsule by mouth at bedtime., Disp: , Rfl:    Coenzyme Q10 (COQ10 PO), Take 1 capsule by mouth daily., Disp: , Rfl:    furosemide (LASIX) 20 MG tablet, Take 1 tablet (20 mg total) by mouth daily as needed., Disp: 30 tablet, Rfl: 3   levothyroxine (SYNTHROID) 75 MCG tablet, Take 75 mcg by mouth daily., Disp: , Rfl:    lidocaine (LIDODERM) 5 %, Place 1 patch onto the skin daily. Remove & Discard patch within 12 hours or as directed by MD, Disp: 30 patch, Rfl: 0   meclizine (ANTIVERT) 12.5 MG tablet, Take 1 tablet (12.5 mg total) by mouth 3 (three) times daily as needed for dizziness., Disp: 20 tablet, Rfl: 0   Melatonin CR (MELADOX) 3 MG TBCR, Take 1 tablet by mouth as needed., Disp: , Rfl:    metFORMIN (GLUCOPHAGE) 500 MG tablet, Take 1 tablet (500 mg total) by mouth daily with breakfast., Disp: , Rfl:    Multiple Vitamins-Minerals (PRESERVISION AREDS 2) CAPS, Take 1 capsule by mouth 2 (two) times daily., Disp: , Rfl:    Omega-3 Fatty Acids (FISH OIL PO), Take 1 capsule by  mouth daily., Disp: , Rfl:    senna (SENOKOT) 8.6 MG tablet, Take 1 tablet by mouth daily., Disp: , Rfl:    spironolactone (ALDACTONE) 50 MG tablet, Take 25 mg by mouth daily., Disp: , Rfl:    timolol (TIMOPTIC) 0.5 % ophthalmic solution, Place 1 drop into both eyes 2 (two) times daily. , Disp: , Rfl: 11    Cardiovascular & other pertient studies:  EKG 11/12/2022: Sinus rhythm 61 bpm First degree A-V block   Echocardiogram 01/27/2022:  Left ventricle cavity is normal in size and wall thickness. Normal global  wall motion. Normal LV systolic function with EF 59%. Doppler evidence of  grade I (impaired) diastolic dysfunction, normal LAP.  Left atrial cavity is mildly dilated.  Mild (Grade I) mitral regurgitation.  Moderate to severe tricuspid regurgitation. Estimated PASP 30 mmHg. Previous study in 2020 reported mild TR. No other significant change.   Lexiscan myoview stress test 05/22/2018: 1. Lexiscan stress test was performed. Exercise capacity was not assessed. Stress symptoms included headache. Resting blood pressure was 160/78 mmHg and peak effect blood pressure was 128/56 mmHg. The resting and stress electrocardiogram demonstrated normal sinus rhythm, normal resting conduction, possible old anteroseptal infarct, no resting arrhythmias and normal rest repolarization.  Stress EKG is non diagnostic for ischemia as it is a pharmacologic stress. 2.  The overall quality of the study is good. There is no evidence of abnormal lung activity. LV cavity is small. Stress SPECT images demonstrate homogeneous tracer distribution throughout the myocardium. Small inferior apical perfusion defect seen on rest images only likely represent tissue attenuation artifact. Gated SPECT imaging reveals normal myocardial thickening and wall motion. The left ventricular ejection fraction was normal (75%).   3. Low risk study.  Recent labs: 5/8/20224: Glucose 120, BUN/Cr 40/1.08. EGFR 48. Na/K 133/5.0.  H/H  10.9/34.1. MCV 95. Platelets 354  12/23/2021: BNP 88, normal     Review of Systems  Cardiovascular:  Positive for dyspnea on exertion. Negative for chest pain, leg swelling, palpitations and syncope.         Vitals:   11/12/22 1440  BP: 133/61  Pulse: 64  Resp: 16  SpO2: 97%     Body mass index is 19.1 kg/m. Filed Weights   11/12/22 1440  Weight: 91 lb 6.4 oz (41.5 kg)      Objective:   Physical Exam Vitals and nursing note reviewed.  Constitutional:      General: She is not in acute distress. Neck:     Vascular: No JVD.  Cardiovascular:     Rate and Rhythm: Normal rate and regular rhythm.     Heart sounds: Normal heart sounds. No murmur heard. Pulmonary:     Effort: Pulmonary effort is normal.     Breath sounds: Normal breath sounds. No wheezing or rales.  Musculoskeletal:     Right lower leg: No edema.     Left lower leg: No edema.           Assessment & Recommendations:   89 year old Caucasian female with hypertension, type 2 diabetes mellitus, hyperlipidemia, coronary artery disease, prior HFpEF, Bell's palsy  CAD: Coronary calcification without ischemia on stress test in 2020. However, persistent exertional dyspnea with no active signs of congestive heart failure. Recommend Lexiscan nuclear stress test.Anemia could be contributing as well.  F/u in 3 months    Elder Negus, MD Pager: [phone] Office: [phone]

## 2022-12-02 ENCOUNTER — Ambulatory Visit: Payer: Medicare Other

## 2022-12-02 DIAGNOSIS — R0609 Other forms of dyspnea: Secondary | ICD-10-CM

## 2022-12-06 NOTE — Progress Notes (Signed)
Called patient, NA

## 2022-12-07 NOTE — Progress Notes (Signed)
Patient called back, and was informed about her stress test. Patient understood.

## 2022-12-08 ENCOUNTER — Ambulatory Visit: Payer: Medicare Other | Admitting: Podiatry

## 2022-12-08 ENCOUNTER — Encounter: Payer: Self-pay | Admitting: Podiatry

## 2022-12-08 DIAGNOSIS — E1149 Type 2 diabetes mellitus with other diabetic neurological complication: Secondary | ICD-10-CM | POA: Diagnosis not present

## 2022-12-08 DIAGNOSIS — M79675 Pain in left toe(s): Secondary | ICD-10-CM

## 2022-12-08 DIAGNOSIS — M79674 Pain in right toe(s): Secondary | ICD-10-CM | POA: Diagnosis not present

## 2022-12-08 DIAGNOSIS — B351 Tinea unguium: Secondary | ICD-10-CM

## 2022-12-08 NOTE — Progress Notes (Signed)
This patient returns to my office for at risk foot care.  This patient requires this care by a professional since this patient will be at risk due to having diabetes type 2 and CKD.  This patient is unable to cut nails himself since the patient cannot reach his nails.These nails are painful walking and wearing shoes.  This patient presents for at risk foot care today.  General Appearance  Alert, conversant and in no acute stress.  Vascular  Dorsalis pedis and posterior tibial  pulses are weakly  palpable  bilaterally.  Capillary return is within normal limits  bilaterally. Temperature is within normal limits  bilaterally.  Neurologic  Senn-Weinstein monofilament wire test dimiinshed   bilaterally. Muscle power within normal limits bilaterally.  Nails Thick disfigured discolored nails with subungual debris  from hallux to fifth toes bilaterally. No evidence of bacterial infection or drainage bilaterally.  Orthopedic  No limitations of motion  feet .  No crepitus or effusions noted.  No bony pathology or digital deformities noted.  HAV  B/L  Skin  normotropic skin with no porokeratosis noted bilaterally.  No signs of infections or ulcers noted.     Onychomycosis  Pain in right toes  Pain in left toes  Consent was obtained for treatment procedures.   Mechanical debridement of nails 1-5  bilaterally performed with a nail nipper.  Filed with dremel without incident.    Return office visit  12   weeks                   Told patient to return for periodic foot care and evaluation due to potential at risk complications.  Patient is waiting for her diabetic shoes.   Helane Gunther DPM

## 2023-01-05 ENCOUNTER — Encounter: Payer: Self-pay | Admitting: Cardiology

## 2023-01-05 ENCOUNTER — Ambulatory Visit: Payer: Medicare Other | Admitting: Cardiology

## 2023-01-05 VITALS — BP 128/66 | HR 62 | Resp 16 | Ht <= 58 in | Wt 92.6 lb

## 2023-01-05 DIAGNOSIS — I361 Nonrheumatic tricuspid (valve) insufficiency: Secondary | ICD-10-CM | POA: Insufficient documentation

## 2023-01-05 NOTE — Progress Notes (Signed)
Follow up visit  Subjective:   Jade [name], female    DOB: 28-Feb-1928, 89 y.o.   MRN: [id number]    HPI   Chief Complaint  Patient presents with   Shortness of Breath    89 year old Caucasian female with hypertension, type 2 diabetes mellitus, hyperlipidemia, coronary artery disease, prior HFpEF, Bell's palsy  Patient is doing well without severe dyspnea. Discussed stress test results with the patient, details below.     Current Outpatient Medications:    atorvastatin (LIPITOR) 10 MG tablet, Take 5 mg by mouth at bedtime., Disp: , Rfl:    azelastine (ASTELIN) 0.1 % nasal spray, Place 1 spray into both nostrils 2 (two) times daily., Disp: , Rfl:    Cholecalciferol (VITAMIN D-3 PO), Take 1 capsule by mouth at bedtime., Disp: , Rfl:    Coenzyme Q10 (COQ10 PO), Take 1 capsule by mouth daily., Disp: , Rfl:    levothyroxine (SYNTHROID) 75 MCG tablet, Take 75 mcg by mouth daily., Disp: , Rfl:    lidocaine (LIDODERM) 5 %, Place 1 patch onto the skin daily. Remove & Discard patch within 12 hours or as directed by MD, Disp: 30 patch, Rfl: 0   meclizine (ANTIVERT) 12.5 MG tablet, Take 1 tablet (12.5 mg total) by mouth 3 (three) times daily as needed for dizziness., Disp: 20 tablet, Rfl: 0   Melatonin CR (MELADOX) 3 MG TBCR, Take 1 tablet by mouth as needed., Disp: , Rfl:    metFORMIN (GLUCOPHAGE) 500 MG tablet, Take 1 tablet (500 mg total) by mouth daily with breakfast., Disp: , Rfl:    Multiple Vitamins-Minerals (PRESERVISION AREDS 2) CAPS, Take 1 capsule by mouth 2 (two) times daily., Disp: , Rfl:    Omega-3 Fatty Acids (FISH OIL PO), Take 1 capsule by mouth daily., Disp: , Rfl:    senna (SENOKOT) 8.6 MG tablet, Take 1 tablet by mouth daily., Disp: , Rfl:    spironolactone (ALDACTONE) 50 MG tablet, Take 25 mg by mouth daily., Disp: , Rfl:    timolol (TIMOPTIC) 0.5 % ophthalmic solution, Place 1 drop into both eyes 2 (two) times daily. , Disp: , Rfl: 11   furosemide (LASIX) 20 MG  tablet, Take 1 tablet (20 mg total) by mouth daily as needed., Disp: 30 tablet, Rfl: 3    Cardiovascular & other pertient studies:  EKG 11/12/2022: Sinus rhythm 61 bpm First degree A-V block   Regadenoson  Nuclear stress test 12/02/2022: Myocardial perfusion is normal. Overall LV systolic function is normal without regional wall motion abnormalities. Stress LV EF: 80%.  Nondiagnostic ECG stress. The heart rate response was consistent with Regadenoson.  No previous exam available for comparison. Low risk.    Echocardiogram 01/27/2022:  Left ventricle cavity is normal in size and wall thickness. Normal global  wall motion. Normal LV systolic function with EF 59%. Doppler evidence of  grade I (impaired) diastolic dysfunction, normal LAP.  Left atrial cavity is mildly dilated.  Mild (Grade I) mitral regurgitation.  Moderate to severe tricuspid regurgitation. Estimated PASP 30 mmHg. Previous study in 2020 reported mild TR. No other significant change.   Recent labs: 5/8/20224: Glucose 120, BUN/Cr 40/1.08. EGFR 48. Na/K 133/5.0.  H/H 10.9/34.1. MCV 95. Platelets 354  12/23/2021: BNP 88, normal     Review of Systems  Cardiovascular:  Positive for dyspnea on exertion (Mild, stable). Negative for chest pain, leg swelling, palpitations and syncope.         Vitals:   01/05/23 1444  BP:  128/66  Pulse: 62  Resp: 16  SpO2: 97%     Body mass index is 19.35 kg/m. Filed Weights   01/05/23 1444  Weight: 92 lb 9.6 oz (42 kg)      Objective:   Physical Exam Vitals and nursing note reviewed.  Constitutional:      General: She is not in acute distress. Neck:     Vascular: No JVD.  Cardiovascular:     Rate and Rhythm: Normal rate and regular rhythm.     Heart sounds: Murmur heard.     Holosystolic murmur is present with a grade of 2/6 at the lower right sternal border.  Pulmonary:     Effort: Pulmonary effort is normal.     Breath sounds: Normal breath sounds. No  wheezing or rales.  Musculoskeletal:     Right lower leg: No edema.     Left lower leg: No edema.           Assessment & Recommendations:   89 year old Caucasian female with hypertension, type 2 diabetes mellitus, hyperlipidemia, coronary artery disease, prior HFpEF, Bell's palsy  CAD: Coronary calcification without ischemia on stress test in 2020. However, persistent exertional dyspnea with no active signs of congestive heart failure. No significant ischemia/infarction on stress testing (11/2022).  Tricuspid regurgitation: Mod to severe without pulmonary hypertension (echocardiogram 01/2022). Will repeat echocardiogram now.  F/u in 6 months    Elder Negus, MD Pager: [phone] Office: [phone]

## 2023-01-26 ENCOUNTER — Encounter: Payer: Self-pay | Admitting: Neurology

## 2023-01-26 ENCOUNTER — Ambulatory Visit: Payer: Medicare Other | Admitting: Neurology

## 2023-01-26 VITALS — BP 161/71 | HR 62 | Ht 59.0 in | Wt 94.5 lb

## 2023-01-26 DIAGNOSIS — R42 Dizziness and giddiness: Secondary | ICD-10-CM | POA: Diagnosis not present

## 2023-01-26 DIAGNOSIS — M542 Cervicalgia: Secondary | ICD-10-CM

## 2023-01-26 DIAGNOSIS — M47812 Spondylosis without myelopathy or radiculopathy, cervical region: Secondary | ICD-10-CM

## 2023-01-26 DIAGNOSIS — M67911 Unspecified disorder of synovium and tendon, right shoulder: Secondary | ICD-10-CM | POA: Diagnosis not present

## 2023-01-26 MED ORDER — PREDNISONE 10 MG PO TABS
ORAL_TABLET | ORAL | 0 refills | Status: DC
Start: 2023-01-26 — End: 2023-08-04

## 2023-01-26 NOTE — Progress Notes (Signed)
[facility]  PATIENT: Jade [name] DOB: [birth year]  REFERRING DOCTOR OR PCP:  Dr. Renne Crigler  SOURCE: Patient, notes from primary care, imaging and lab reports, MRI and CT scan images personally reviewed  _________________________________   HISTORICAL  CHIEF COMPLAINT:  Chief Complaint  Patient presents with   New Patient (Initial Visit)    Pt in room 10, granddaughter in room.New patient here for dizziness. Pt said when moving neck, looking up and looking down, bending over she gets dizziness. 18 months since last fall.  Pt blood pressure elevated x 2. Pt states she has white coat syndrome. Reports normal blood pressure at home.     HISTORY OF PRESENT ILLNESS:  I had the pleasure of seeing your patient, Jade [name], at [facility] for neurologic consultation regarding her dizziness.  She is a 89 year old woman who reports dizziness when she bends over or looks up or down.   She reports dizziness but does not note actual vertigo.   She notes the spells last up to a minute and she is unsteady until it passes.  If standing she stays still.  Some episodes also occur when turning in bed.   These spells started 5 months ago  She went to the ED in May 2024.   MRI brain 09/21/2022 shows mild age appropriate atrophy and chronic microvascuar ischemic change but no acute findings.    CTA showed no critical stenosis.    She had some neck apin and was felt to have torticollis at the time.     She was referred to PT and did some therapy for her neck.   Xray of neck 11/12/2021 showed "Advanced cervical spondylosis with ankylosis of C1-C3 and C4-C7. Advanced disc space height loss at C3-C4 likely with partial ankylosis at this level. Advanced cervical facet arthropathy. Grade 1 anterolisthesis C7 on T1."   She reports cervical fusion 40 years ago/   She does not recall a second operation to take screws out.    .    She also has right shoulder pain and was told  she has a torn rotator cuff.   REVIEW OF SYSTEMS: Constitutional: No fevers, chills, sweats, or change in appetite Eyes: No visual changes, double vision, eye pain Ear, nose and throat: No hearing loss, ear pain, nasal congestion, sore throat Cardiovascular: No chest pain, palpitations Respiratory:  No shortness of breath at rest or with exertion.   No wheezes GastrointestinaI: No nausea, vomiting, diarrhea, abdominal pain, fecal incontinence Genitourinary:  No dysuria, urinary retention or frequency.  No nocturia. Musculoskeletal:  No neck pain, back pain Integumentary: No rash, pruritus, skin lesions Neurological: as above Psychiatric: No depression at this time.  No anxiety Endocrine: No palpitations, diaphoresis, change in appetite, change in weigh or increased thirst Hematologic/Lymphatic:  No anemia, purpura, petechiae. Allergic/Immunologic: No itchy/runny eyes, nasal congestion, recent allergic reactions, rashes  ALLERGIES: Allergies  Allergen Reactions   Codeine Nausea And Vomiting    Pt saw flashing lights   Miralax [Polyethylene Glycol] Swelling    Feet swelling   Tramadol Nausea And Vomiting and Other (See Comments)    Pt saw flashing lights   Ciprofibrate Swelling and Anxiety    Other reaction(s): muscle aches   Ciprofloxacin Hcl Swelling and Anxiety    Muscle weakness.   Pentazocine Nausea And Vomiting, Swelling and Anxiety    Patient experiences Flashing lights    HOME MEDICATIONS:  Current Outpatient Medications:    amLODipine (NORVASC) 2.5 MG tablet, Take  2.5 mg by mouth daily., Disp: , Rfl:    aspirin EC 81 MG tablet, Take 81 mg by mouth daily. Swallow whole., Disp: , Rfl:    azelastine (ASTELIN) 0.1 % nasal spray, Place 1 spray into both nostrils 2 (two) times daily., Disp: , Rfl:    Cholecalciferol (VITAMIN D-3 PO), Take 1 capsule by mouth at bedtime. 1000 units daily, Disp: , Rfl:    Coenzyme Q10 (COQ10 PO), Take 1 capsule by mouth daily., Disp: , Rfl:     levothyroxine (SYNTHROID) 75 MCG tablet, Take 75 mcg by mouth daily., Disp: , Rfl:    lidocaine (LIDODERM) 5 %, Place 1 patch onto the skin daily. Remove & Discard patch within 12 hours or as directed by MD, Disp: 30 patch, Rfl: 0   Melatonin CR (MELADOX) 3 MG TBCR, Take 1 tablet by mouth as needed., Disp: , Rfl:    metFORMIN (GLUCOPHAGE) 500 MG tablet, Take 1 tablet (500 mg total) by mouth daily with breakfast., Disp: , Rfl:    Multiple Vitamins-Minerals (PRESERVISION AREDS 2) CAPS, Take 1 capsule by mouth 2 (two) times daily., Disp: , Rfl:    Omega-3 Fatty Acids (FISH OIL PO), Take 1 capsule by mouth daily., Disp: , Rfl:    ONETOUCH VERIO test strip, 1 each by Other route daily., Disp: , Rfl:    senna (SENOKOT) 8.6 MG tablet, Take 1 tablet by mouth daily., Disp: , Rfl:    spironolactone (ALDACTONE) 50 MG tablet, Take 25 mg by mouth daily., Disp: , Rfl:    timolol (TIMOPTIC) 0.5 % ophthalmic solution, Place 1 drop into both eyes 2 (two) times daily. , Disp: , Rfl: 11   atorvastatin (LIPITOR) 10 MG tablet, Take 5 mg by mouth at bedtime. (Patient not taking: Reported on 01/26/2023), Disp: , Rfl:    furosemide (LASIX) 20 MG tablet, Take 1 tablet (20 mg total) by mouth daily as needed., Disp: 30 tablet, Rfl: 3   meclizine (ANTIVERT) 12.5 MG tablet, Take 1 tablet (12.5 mg total) by mouth 3 (three) times daily as needed for dizziness. (Patient not taking: Reported on 01/26/2023), Disp: 20 tablet, Rfl: 0  PAST MEDICAL HISTORY: Past Medical History:  Diagnosis Date   Allergic rhinitis    Bronchitis    Cataract    CKD (chronic kidney disease)    stage 3 kidney failure per pt   Diabetes mellitus    Diverticulosis 11/2010   GERD (gastroesophageal reflux disease)    Glaucoma    Hyperlipidemia    Hypertension    Hypothyroid    Internal hemorrhoids    Tubular adenoma polyp of rectum 02/2002   Type 2 diabetes mellitus (HCC)     PAST SURGICAL HISTORY: Past Surgical History:  Procedure Laterality  Date   APPENDECTOMY     BIOPSY  12/02/2021   Procedure: BIOPSY;  Surgeon: Imogene Burn, MD;  Location: [facility] ENDOSCOPY;  Service: Gastroenterology;;   BREAST CYST EXCISION     CHOLECYSTECTOMY     COLONOSCOPY     DIVERTICULITIS  2004   SURGERY FOR DIVERTICULITIS PER PT.   FLEXIBLE SIGMOIDOSCOPY N/A 12/02/2021   Procedure: FLEXIBLE SIGMOIDOSCOPY;  Surgeon: Imogene Burn, MD;  Location: [facility] ENDOSCOPY;  Service: Gastroenterology;  Laterality: N/A;   HERNIA REPAIR     w/ mesh   POLYPECTOMY     TONSILLECTOMY     TOTAL ABDOMINAL HYSTERECTOMY W/ BILATERAL SALPINGOOPHORECTOMY      FAMILY HISTORY: Family History  Problem Relation Age of Onset  Lung cancer Father    Esophageal cancer Father    Heart disease Mother    Heart disease Brother    Diabetes Maternal Aunt    Diabetes Maternal Uncle    Diabetes Brother    Colon cancer Neg Hx    Rectal cancer Neg Hx    Stomach cancer Neg Hx     SOCIAL HISTORY: Social History   Socioeconomic History   Marital status: Widowed    Spouse name: Not on file   Number of children: 2   Years of education: Not on file   Highest education level: Not on file  Occupational History   Not on file  Tobacco Use   Smoking status: Never   Smokeless tobacco: Never  Vaping Use   Vaping status: Never Used  Substance and Sexual Activity   Alcohol use: No   Drug use: No   Sexual activity: Not on file  Other Topics Concern   Not on file  Social History Narrative   Right handed mainly, but can can write with left   Wears glasses    Drinks def coffee 1 cup daily    Drinks sodas (prn)    Social Determinants of Health   Financial Resource Strain: Not on file  Food Insecurity: Not on file  Transportation Needs: Not on file  Physical Activity: Not on file  Stress: Not on file  Social Connections: Not on file  Intimate Partner Violence: Not on file       PHYSICAL EXAM  Vitals:   01/26/23 1241 01/26/23 1252  BP: (!) 166/74 (!) 161/71  Pulse: 65  62  Weight: 94 lb 8 oz (42.9 kg)   Height: 4\' 11"  (1.499 m)     Body mass index is 19.09 kg/m.   General: The patient is well-developed and well-nourished and in no acute distress  HEENT:  Head is [place]/AT.  Sclera are anicteric.    Neck: No carotid bruits are noted.  The neck is nontender but has a reduced range of motion.  Cardiovascular: The heart has a regular rate and rhythm with a normal S1 and S2. There were no murmurs, gallops or rubs.    Skin: Extremities are without rash or  edema.  Musculoskeletal: Some tenderness at the right shoulder.  She has reduced active range of motion in the right shoulder but normal passive range of motion.  Neurologic Exam  Mental status: The patient is alert and oriented x 3 at the time of the examination. The patient has apparent normal recent and remote memory, with an apparently normal attention span and concentration ability.   Speech is normal.  Cranial nerves: Extraocular movements are full. Pupils are equal, round, and reactive to light and accomodation.  Visual fields are full.  Facial symmetry is present. There is good facial sensation to soft touch bilaterally.Facial strength is normal.  Trapezius and sternocleidomastoid strength is normal. No dysarthria is noted.  The tongue is midline, and the patient has symmetric elevation of the soft palate. No obvious hearing deficits are noted.  Motor:  Muscle bulk is normal.   Tone is normal. Strength is  5 / 5 in all 4 extremities.   Sensory: Sensory testing is intact to pinprick, soft touch and vibration sensation in all 4 extremities.  Vibration sensation is almost the same at the toes as the knees..  Coordination: Cerebellar testing reveals good finger-nose-finger and heel-to-shin bilaterally.  Gait and station: Station is normal.   Gait is mildly shuffling and  her turn is 5 steps.  She has mild retropulsion.. Romberg is negative.   Reflexes: Deep tendon reflexes are symmetric and normal  bilaterally.   Plantar responses are flexor.  Dix-Hallpike maneuver to either side did not elicit vertigo or nystagmus.    DIAGNOSTIC DATA (LABS, IMAGING, TESTING) - I reviewed patient records, labs, notes, testing and imaging myself where available.  Lab Results  Component Value Date   WBC 9.5 09/21/2022   HGB 10.9 (L) 09/21/2022   HCT 34.1 (L) 09/21/2022   MCV 95.0 09/21/2022   PLT 354 09/21/2022      Component Value Date/Time   NA 133 (L) 09/21/2022 1530   NA 136 01/22/2019 1521   K 5.0 09/21/2022 1530   CL 102 09/21/2022 1530   CO2 23 09/21/2022 1530   GLUCOSE 120 (H) 09/21/2022 1530   BUN 40 (H) 09/21/2022 1530   BUN 36 (H) 01/22/2019 1521   CREATININE 1.08 (H) 09/21/2022 1530   CALCIUM 10.1 09/21/2022 1530   PROT 6.0 (L) 12/04/2021 0137   PROT 7.8 09/26/2017 1800   ALBUMIN 2.5 (L) 12/04/2021 0137   ALBUMIN 4.3 09/26/2017 1800   AST 19 12/04/2021 0137   ALT 17 12/04/2021 0137   ALKPHOS 59 12/04/2021 0137   BILITOT 0.7 12/04/2021 0137   BILITOT 0.5 09/26/2017 1800   GFRNONAA 48 (L) 09/21/2022 1530   GFRAA 47 (L) 01/22/2019 1521       ASSESSMENT AND PLAN  Vertigo  Neck pain  Osteoarthritis of cervical spine, unspecified spinal osteoarthritis complication status  Disorder of right rotator cuff   In summary, Jade [name] is a 89 year old woman who reports episodes of positional vertigo.  Despite the description being consistent with benign paroxysmal positional vertigo, she did not have positive Dix-Hallpike maneuvers that could have made a small sure of the diagnosis.  Therefore, an Epley was not done.  I did instruct her on the Brandt-Daroff exercises.  She has done these in the past and did not think that they had been helpful.  I asked her to try to do these for a couple weeks.  Additionally I gave a small dose steroid pack to see if that can help some of her symptoms.  Medications like meclizine are unlikely to help enough and would have high likelihood of  causing side effects that could increase her falls  She has advanced degenerative changes in her cervical spine with multilevel degenerative fusion (versus surgical fusion with hardware removed).  This has led to reduced range of motion in her neck and some neck pain.  She does not appear to have torticollis.  She also appears to have a rotator cuff issue in the right shoulder and is advised to follow-up with orthopedics if this continues to be a problem.  A follow-up visit was not scheduled but she was advised to call if she has a significant new or worsening neurologic symptoms.        Jade Hemsley A. Epimenio Foot, MD, [facility] 01/26/2023, 1:08 PM Certified in Neurology, Clinical Neurophysiology, Sleep Medicine and Neuroimaging  [facility] [address], [address] [place], [place] [address] [phone]

## 2023-02-09 ENCOUNTER — Ambulatory Visit: Payer: Medicare Other | Admitting: Cardiology

## 2023-02-10 ENCOUNTER — Ambulatory Visit (HOSPITAL_COMMUNITY): Payer: Medicare Other | Attending: Cardiology

## 2023-02-10 DIAGNOSIS — I361 Nonrheumatic tricuspid (valve) insufficiency: Secondary | ICD-10-CM | POA: Diagnosis present

## 2023-02-10 LAB — ECHOCARDIOGRAM COMPLETE
Area-P 1/2: 3.5 cm2
S' Lateral: 1.9 cm

## 2023-03-15 ENCOUNTER — Encounter: Payer: Self-pay | Admitting: Podiatry

## 2023-03-15 ENCOUNTER — Ambulatory Visit: Payer: Medicare Other | Admitting: Podiatry

## 2023-03-15 DIAGNOSIS — B351 Tinea unguium: Secondary | ICD-10-CM

## 2023-03-15 DIAGNOSIS — M79674 Pain in right toe(s): Secondary | ICD-10-CM | POA: Diagnosis not present

## 2023-03-15 DIAGNOSIS — E1149 Type 2 diabetes mellitus with other diabetic neurological complication: Secondary | ICD-10-CM

## 2023-03-15 DIAGNOSIS — M79675 Pain in left toe(s): Secondary | ICD-10-CM

## 2023-03-15 NOTE — Progress Notes (Signed)
This patient returns to my office for at risk foot care.  This patient requires this care by a professional since this patient will be at risk due to having diabetes type 2 and CKD.  This patient is unable to cut nails himself since the patient cannot reach his nails.These nails are painful walking and wearing shoes.  This patient presents for at risk foot care today.  General Appearance  Alert, conversant and in no acute stress.  Vascular  Dorsalis pedis and posterior tibial  pulses are weakly  palpable  bilaterally.  Capillary return is within normal limits  bilaterally. Temperature is within normal limits  bilaterally.  Neurologic  Senn-Weinstein monofilament wire test dimiinshed   bilaterally. Muscle power within normal limits bilaterally.  Nails Thick disfigured discolored nails with subungual debris  from hallux to fifth toes bilaterally. No evidence of bacterial infection or drainage bilaterally.  Orthopedic  No limitations of motion  feet .  No crepitus or effusions noted.  No bony pathology or digital deformities noted.  HAV  B/L  Skin  normotropic skin with no porokeratosis noted bilaterally.  No signs of infections or ulcers noted.     Onychomycosis  Pain in right toes  Pain in left toes  Consent was obtained for treatment procedures.   Mechanical debridement of nails 1-5  bilaterally performed with a nail nipper.  Filed with dremel without incident.    Return office visit  12   weeks                   Told patient to return for periodic foot care and evaluation due to potential at risk complications.  Patient is waiting for her diabetic shoes.   Helane Gunther DPM

## 2023-06-09 ENCOUNTER — Telehealth: Payer: Self-pay

## 2023-06-09 NOTE — Telephone Encounter (Signed)
Left voicemail for diabetic shoe pick up

## 2023-06-15 ENCOUNTER — Ambulatory Visit: Payer: Medicare Other | Admitting: Podiatry

## 2023-06-22 ENCOUNTER — Encounter: Payer: Self-pay | Admitting: Podiatry

## 2023-06-22 ENCOUNTER — Ambulatory Visit: Payer: Medicare Other | Admitting: Podiatry

## 2023-06-22 ENCOUNTER — Telehealth: Payer: Self-pay

## 2023-06-22 DIAGNOSIS — E1149 Type 2 diabetes mellitus with other diabetic neurological complication: Secondary | ICD-10-CM

## 2023-06-22 DIAGNOSIS — M79675 Pain in left toe(s): Secondary | ICD-10-CM

## 2023-06-22 DIAGNOSIS — M2141 Flat foot [pes planus] (acquired), right foot: Secondary | ICD-10-CM

## 2023-06-22 DIAGNOSIS — B351 Tinea unguium: Secondary | ICD-10-CM | POA: Diagnosis not present

## 2023-06-22 DIAGNOSIS — M79674 Pain in right toe(s): Secondary | ICD-10-CM

## 2023-06-22 DIAGNOSIS — M2011 Hallux valgus (acquired), right foot: Secondary | ICD-10-CM

## 2023-06-22 NOTE — Telephone Encounter (Signed)
 Patient wants a Sz 7 shoe / ppw from Dr Renne Crigler faxed to [facility] will call patient when in for her to PU new sz inserts were kept here

## 2023-06-22 NOTE — Progress Notes (Signed)
 This patient returns to my office for at risk foot care.  This patient requires this care by a professional since this patient will be at risk due to having diabetes type 2 and CKD.  This patient is unable to cut nails himself since the patient cannot reach his nails.These nails are painful walking and wearing shoes.  This patient presents for at risk foot care today.  General Appearance  Alert, conversant and in no acute stress.  Vascular  Dorsalis pedis and posterior tibial  pulses are weakly  palpable  bilaterally.  Capillary return is within normal limits  bilaterally. Temperature is within normal limits  bilaterally.  Neurologic  Senn-Weinstein monofilament wire test dimiinshed   bilaterally. Muscle power within normal limits bilaterally.  Nails Thick disfigured discolored nails with subungual debris  from hallux to fifth toes bilaterally. No evidence of bacterial infection or drainage bilaterally.  Orthopedic  No limitations of motion  feet .  No crepitus or effusions noted.  No bony pathology or digital deformities noted.  HAV  B/L  Skin  normotropic skin with no porokeratosis noted bilaterally.  No signs of infections or ulcers noted.     Onychomycosis  Pain in right toes  Pain in left toes  Consent was obtained for treatment procedures.   Mechanical debridement of nails 1-5  bilaterally performed with a nail nipper.  Filed with dremel without incident.    Return office visit  12   weeks                   Told patient to return for periodic foot care and evaluation due to potential at risk complications.  Patient is to receive her diabetic shoes today.   Helane Gunther DPM

## 2023-06-28 ENCOUNTER — Telehealth: Payer: Self-pay

## 2023-06-28 NOTE — Telephone Encounter (Signed)
 Ppw expired re-faxed new ppw to Dr. Renne Crigler / Documents only order added to [facility]

## 2023-06-29 ENCOUNTER — Telehealth: Payer: Self-pay

## 2023-06-29 NOTE — Telephone Encounter (Signed)
 Left vm to let them know their diabetic shoes have came in and to schedule appt

## 2023-06-30 ENCOUNTER — Ambulatory Visit (INDEPENDENT_AMBULATORY_CARE_PROVIDER_SITE_OTHER)

## 2023-06-30 DIAGNOSIS — M2142 Flat foot [pes planus] (acquired), left foot: Secondary | ICD-10-CM

## 2023-06-30 DIAGNOSIS — M2011 Hallux valgus (acquired), right foot: Secondary | ICD-10-CM

## 2023-06-30 DIAGNOSIS — E1149 Type 2 diabetes mellitus with other diabetic neurological complication: Secondary | ICD-10-CM | POA: Diagnosis not present

## 2023-06-30 DIAGNOSIS — M2012 Hallux valgus (acquired), left foot: Secondary | ICD-10-CM

## 2023-06-30 DIAGNOSIS — M2141 Flat foot [pes planus] (acquired), right foot: Secondary | ICD-10-CM

## 2023-06-30 NOTE — Progress Notes (Signed)

## 2023-07-05 ENCOUNTER — Ambulatory Visit: Payer: Medicare Other | Admitting: Cardiology

## 2023-07-12 ENCOUNTER — Telehealth: Payer: Self-pay | Admitting: Podiatry

## 2023-07-12 NOTE — Telephone Encounter (Signed)
 Some papers was sent to Primary, Dr. Renne Crigler and both Pt/Dr want to know what the were sent for. She already pick shoes. Please call Pt so she can call the Dr to let him know what they are for.

## 2023-07-26 LAB — LAB REPORT - SCANNED
A1c: 6.1
Albumin, Urine POC: 34.2
Albumin/Creatinine Ratio, Urine, POC: 44
Creatinine, POC: 78.5 mg/dL
EGFR: 36

## 2023-08-04 ENCOUNTER — Ambulatory Visit: Attending: Cardiology | Admitting: Cardiology

## 2023-08-04 ENCOUNTER — Encounter: Payer: Self-pay | Admitting: Cardiology

## 2023-08-04 VITALS — BP 120/62 | HR 59 | Resp 16 | Ht 59.0 in | Wt 95.0 lb

## 2023-08-04 DIAGNOSIS — N179 Acute kidney failure, unspecified: Secondary | ICD-10-CM | POA: Diagnosis not present

## 2023-08-04 DIAGNOSIS — I2510 Atherosclerotic heart disease of native coronary artery without angina pectoris: Secondary | ICD-10-CM | POA: Diagnosis not present

## 2023-08-04 DIAGNOSIS — I361 Nonrheumatic tricuspid (valve) insufficiency: Secondary | ICD-10-CM

## 2023-08-04 NOTE — Patient Instructions (Signed)
 Follow-Up: At [facility], you and your health needs are our priority.  As part of our continuing mission to provide you with exceptional heart care, our providers are all part of one team.  This team includes your primary Cardiologist (physician) and Advanced Practice Providers or APPs (Physician Assistants and Nurse Practitioners) who all work together to provide you with the care you need, when you need it.  Your next appointment:   1 year(s)  Provider:   Elder Negus, MD     We recommend signing up for the patient portal called "MyChart".  Sign up information is provided on this After Visit Summary.  MyChart is used to connect with patients for Virtual Visits (Telemedicine).  Patients are able to view lab/test results, encounter notes, upcoming appointments, etc.  Non-urgent messages can be sent to your provider as well.   To learn more about what you can do with MyChart, go to [email or web address].   Other Instructions       1st Floor: - Lobby - Registration  - Pharmacy  - Lab - Cafe  2nd Floor: - PV Lab - Diagnostic Testing (echo, CT, nuclear med)  3rd Floor: - Vacant  4th Floor: - TCTS (cardiothoracic surgery) - AFib Clinic - Structural Heart Clinic - Vascular Surgery  - Vascular Ultrasound  5th Floor: - HeartCare Cardiology (general and EP) - Clinical Pharmacy for coumadin, hypertension, lipid, weight-loss medications, and med management appointments    Valet parking services will be available as well.

## 2023-08-04 NOTE — Progress Notes (Signed)
  Cardiology Office Note:  .   Date:  08/04/2023  ID:  Jade [name], DOB February 13, 1928, MRN [id number] PCP: Merri Brunette, MD  [facility] Providers Cardiologist:  Truett Mainland, MD PCP: Merri Brunette, MD  Chief Complaint  Patient presents with   Nonrheumatic tricuspid valve regurgitation   Follow-up     Jade [name] is a 89 y.o. female with hypertension, type 2 diabetes mellitus, hyperlipidemia, coronary artery disease, prior HFpEF, Bell's palsy   Patient is doing well.  She walked a mile today without any complains of chest pain shortness of breath.  Recently, her spironolactone was stopped by her PCP as her creatinine and potassium increased to 1.36 and 5.4 respectively.  She has upcoming BMP next week.   Vitals:   08/04/23 1506  BP: 120/62  Pulse: (!) 59  Resp: 16      Review of Systems  Cardiovascular:  Negative for chest pain, dyspnea on exertion, leg swelling, palpitations and syncope.        Studies Reviewed: Marland Kitchen        EKG 08/04/2023: Sinus bradycardia with 1st degree A-V block When compared with ECG of 23-Apr-2018 17:14,  rate is slower    Independently interpreted 06/2023: Chol 111, TG 61, HDL 42, LDL 56 HbA1C 6.1% Cr 1.36, K 5.4. Hb 10.9 TSH 3.9  Independently interpreted Echocardiogram 02/10/2023: Mild LVH.  EF 55 to 60%.  Grade 1 vessel dysfunction. Normal RV systolic function.  RVSP 26 mmHg. Mild left atrial dilatation. Mild aortic calcification, no significant stenosis or regurgitation.    Physical Exam Vitals and nursing note reviewed.  Constitutional:      General: She is not in acute distress. Neck:     Vascular: No JVD.  Cardiovascular:     Rate and Rhythm: Normal rate and regular rhythm.     Heart sounds: Normal heart sounds. No murmur heard. Pulmonary:     Effort: Pulmonary effort is normal.     Breath sounds: Normal breath sounds. No wheezing or rales.  Musculoskeletal:     Right lower leg: No edema.      Left lower leg: No edema.      VISIT DIAGNOSES:   ICD-10-CM   1. Nonrheumatic tricuspid valve regurgitation  I36.1 EKG 12-Lead    2. Coronary artery disease involving native coronary artery of native heart without angina pectoris  I25.10     3. AKI (acute kidney injury) [facility])  N17.9        ROBINA [name] is a 89 y.o. female with hypertension, type 2 diabetes mellitus, hyperlipidemia, coronary artery disease, prior HFpEF, Bell's palsy    Assessment & Plan  CAD: Coronary calcification without ischemia on stress test in 2020. However, persistent exertional dyspnea with no active signs of congestive heart failure. No significant ischemia/infarction on stress testing (11/2022). Takes Arpirin prn, opkay with me, Lipids very well controlled.   Tricuspid regurgitation: Only trivial without any PH (echocardiogram 01/2019).  AKI: Agree with discontinuation of spironolactone.  She is doing well without it. She has follow-up BMP with Dr. Renne Crigler next week.      F/u in 1 year  Signed, Elder Negus, MD

## 2023-09-21 ENCOUNTER — Ambulatory Visit: Payer: Medicare Other | Admitting: Podiatry

## 2023-09-21 ENCOUNTER — Encounter: Payer: Self-pay | Admitting: Podiatry

## 2023-09-21 DIAGNOSIS — M79675 Pain in left toe(s): Secondary | ICD-10-CM | POA: Diagnosis not present

## 2023-09-21 DIAGNOSIS — E1149 Type 2 diabetes mellitus with other diabetic neurological complication: Secondary | ICD-10-CM

## 2023-09-21 DIAGNOSIS — B351 Tinea unguium: Secondary | ICD-10-CM

## 2023-09-21 DIAGNOSIS — M79674 Pain in right toe(s): Secondary | ICD-10-CM | POA: Diagnosis not present

## 2023-09-21 NOTE — Progress Notes (Signed)
 This patient returns to my office for at risk foot care.  This patient requires this care by a professional since this patient will be at risk due to having diabetes type 2 and CKD.  This patient is unable to cut nails himself since the patient cannot reach his nails.These nails are painful walking and wearing shoes.  This patient presents for at risk foot care today.  General Appearance  Alert, conversant and in no acute stress.  Vascular  Dorsalis pedis and posterior tibial  pulses are weakly  palpable  bilaterally.  Capillary return is within normal limits  bilaterally. Temperature is within normal limits  bilaterally.  Neurologic  Senn-Weinstein monofilament wire test dimiinshed   bilaterally. Muscle power within normal limits bilaterally.  Nails Thick disfigured discolored nails with subungual debris  from hallux to fifth toes bilaterally. No evidence of bacterial infection or drainage bilaterally.  Orthopedic  No limitations of motion  feet .  No crepitus or effusions noted.  No bony pathology or digital deformities noted.  HAV  B/L  Skin  normotropic skin with no porokeratosis noted bilaterally.  No signs of infections or ulcers noted.     Onychomycosis  Pain in right toes  Pain in left toes  Consent was obtained for treatment procedures.   Mechanical debridement of nails 1-5  bilaterally performed with a nail nipper.  Filed with dremel without incident.    Return office visit  12   weeks                   Told patient to return for periodic foot care and evaluation due to potential at risk complications.  Patient is to receive her diabetic shoes today.   Helane Gunther DPM

## 2023-10-14 ENCOUNTER — Encounter (HOSPITAL_COMMUNITY): Payer: Self-pay

## 2023-10-14 ENCOUNTER — Other Ambulatory Visit: Payer: Self-pay

## 2023-10-14 ENCOUNTER — Emergency Department (HOSPITAL_COMMUNITY)
Admission: EM | Admit: 2023-10-14 | Discharge: 2023-10-14 | Disposition: A | Discharge status: Home / Self Care | Attending: Emergency Medicine | Admitting: Emergency Medicine

## 2023-10-14 DIAGNOSIS — E119 Type 2 diabetes mellitus without complications: Secondary | ICD-10-CM | POA: Insufficient documentation

## 2023-10-14 DIAGNOSIS — Z7982 Long term (current) use of aspirin: Secondary | ICD-10-CM | POA: Diagnosis not present

## 2023-10-14 DIAGNOSIS — R2242 Localized swelling, mass and lump, left lower limb: Secondary | ICD-10-CM | POA: Diagnosis present

## 2023-10-14 DIAGNOSIS — I10 Essential (primary) hypertension: Secondary | ICD-10-CM | POA: Diagnosis not present

## 2023-10-14 DIAGNOSIS — Z7984 Long term (current) use of oral hypoglycemic drugs: Secondary | ICD-10-CM | POA: Diagnosis not present

## 2023-10-14 DIAGNOSIS — I2510 Atherosclerotic heart disease of native coronary artery without angina pectoris: Secondary | ICD-10-CM | POA: Diagnosis not present

## 2023-10-14 DIAGNOSIS — M7989 Other specified soft tissue disorders: Secondary | ICD-10-CM

## 2023-10-14 LAB — BASIC METABOLIC PANEL WITH GFR
Anion gap: 10 (ref 5–15)
BUN: 29 mg/dL — ABNORMAL HIGH (ref 8–23)
CO2: 28 mmol/L (ref 22–32)
Calcium: 10.3 mg/dL (ref 8.9–10.3)
Chloride: 97 mmol/L — ABNORMAL LOW (ref 98–111)
Creatinine, Ser: 1.04 mg/dL — ABNORMAL HIGH (ref 0.44–1.00)
GFR, Estimated: 50 mL/min — ABNORMAL LOW (ref >60)
Glucose, Bld: 116 mg/dL — ABNORMAL HIGH (ref 70–99)
Potassium: 4.2 mmol/L (ref 3.5–5.1)
Sodium: 135 mmol/L (ref 135–145)

## 2023-10-14 LAB — CBC WITH DIFFERENTIAL/PLATELET
Abs Immature Granulocytes: 0.02 10*3/uL (ref 0.00–0.07)
Basophils Absolute: 0.1 10*3/uL (ref 0.0–0.1)
Basophils Relative: 1 %
Eosinophils Absolute: 0.5 10*3/uL (ref 0.0–0.5)
Eosinophils Relative: 5 %
HCT: 36.9 % (ref 36.0–46.0)
Hemoglobin: 11.6 g/dL — ABNORMAL LOW (ref 12.0–15.0)
Immature Granulocytes: 0 %
Lymphocytes Relative: 19 %
Lymphs Abs: 1.7 10*3/uL (ref 0.7–4.0)
MCH: 30.8 pg (ref 26.0–34.0)
MCHC: 31.4 g/dL (ref 30.0–36.0)
MCV: 97.9 fL (ref 80.0–100.0)
Monocytes Absolute: 0.7 10*3/uL (ref 0.1–1.0)
Monocytes Relative: 8 %
Neutro Abs: 6 10*3/uL (ref 1.7–7.7)
Neutrophils Relative %: 67 %
Platelets: 311 10*3/uL (ref 150–400)
RBC: 3.77 MIL/uL — ABNORMAL LOW (ref 3.87–5.11)
RDW: 13.9 % (ref 11.5–15.5)
WBC: 9 10*3/uL (ref 4.0–10.5)
nRBC: 0 % (ref 0.0–0.2)

## 2023-10-14 MED ORDER — DOXYCYCLINE HYCLATE 100 MG PO TABS
100.0000 mg | ORAL_TABLET | Freq: Once | ORAL | Status: AC
  Administered 2023-10-14: 100 mg via ORAL
  Filled 2023-10-14: qty 1

## 2023-10-14 MED ORDER — DOXYCYCLINE HYCLATE 100 MG PO CAPS: 100.0000 mg | ORAL_CAPSULE | Freq: Two times a day (BID) | ORAL | 0 refills | Status: AC

## 2023-10-14 NOTE — ED Triage Notes (Signed)
 Pt arrives via POV. Pt reports she has been having weeping from her left leg since last night. Pt states the swelling has improved, she takes laxsix. Pt denies any other symptoms. She is AxOx4.

## 2023-10-14 NOTE — ED Provider Notes (Signed)
  EMERGENCY DEPARTMENT AT [facility] Provider Note   CSN: [id number] Arrival date & time: 10/14/23  4098     Patient presents with: Leg Swelling   Jade [name] is a 89 y.o. female.   89 y.o. female with hypertension, type 2 diabetes mellitus, hyperlipidemia, coronary artery disease, prior HFpEF, Bell's palsy presents with complaint of mild weeping from her left lower leg.  She woke up this morning with a wet sock.  She denies any specific trauma or injury.  She denies pain.  She denies fever.  She has a longstanding history of lower extremity edema.  She takes Lasix  as previously prescribed without issue.    The history is provided by the patient and medical records.       Prior to Admission medications   Medication Sig Start Date End Date Taking? Authorizing Provider  aspirin  EC 81 MG tablet Take 81 mg by mouth as needed. Swallow whole.    [provider]  atorvastatin  (LIPITOR) 10 MG tablet Take 5 mg by mouth at bedtime.    [provider]  azelastine  (ASTELIN ) 0.1 % nasal spray Place 1 spray into both nostrils 2 (two) times daily. 02/25/22   [provider]  Cholecalciferol  (VITAMIN D -3 PO) Take 1 capsule by mouth at bedtime. 1000 units daily    [provider]  Coenzyme Q10 (COQ10 PO) Take 1 capsule by mouth daily.    [provider]  furosemide  (LASIX ) 20 MG tablet Take 1 tablet (20 mg total) by mouth daily as needed. 02/03/22 11/12/22  Patwardhan, Kaye Parsons, MD  levothyroxine  (SYNTHROID ) 75 MCG tablet Take 75 mcg by mouth daily.    [provider]  lidocaine  (LIDODERM ) 5 % Place 1 patch onto the skin daily. Remove & Discard patch within 12 hours or as directed by MD 09/21/22   Dalene Duck, MD  meclizine  (ANTIVERT ) 12.5 MG tablet Take 1 tablet (12.5 mg total) by mouth 3 (three) times daily as needed for dizziness. 09/21/22   Dalene Duck, MD  Melatonin CR (MELADOX) 3 MG TBCR Take 1 tablet  by mouth as needed. Take 2 weeks off 1 week    [provider]  metFORMIN  (GLUCOPHAGE ) 500 MG tablet Take 1 tablet (500 mg total) by mouth daily with breakfast. 04/28/18   Ghimire, Estil Heman, MD  Multiple Vitamins-Minerals (PRESERVISION AREDS 2) CAPS Take 1 capsule by mouth 2 (two) times daily.    [provider]  Omega-3 Fatty Acids (FISH OIL PO) Take 1 capsule by mouth daily.    [provider]  [facility] VERIO test strip 1 each by Other route daily. 01/22/23   [provider]  senna (SENOKOT) 8.6 MG tablet Take 1 tablet by mouth daily.    [provider]  timolol  (TIMOPTIC ) 0.5 % ophthalmic solution Place 1 drop into both eyes 2 (two) times daily.  08/26/14   [provider]    Allergies: Codeine, Miralax [polyethylene glycol], Tramadol, Ciprofibrate, Ciprofloxacin  hcl, and Pentazocine    Review of Systems  All other systems reviewed and are negative.   Updated Vital Signs BP (!) 179/78   Pulse 68   Temp 98.7 F (37.1 C) (Oral)   Resp 18   Ht 4' 11 (1.499 m)   Wt 44.5 kg   SpO2 98%   BMI 19.79 kg/m   Physical Exam Vitals and nursing note reviewed.  Constitutional:      General: She is not in acute distress.  Appearance: Normal appearance. She is well-developed.  HENT:     Head: Normocephalic and atraumatic.   Eyes:     Conjunctiva/sclera: Conjunctivae normal.     Pupils: Pupils are equal, round, and reactive to light.    Cardiovascular:     Rate and Rhythm: Normal rate and regular rhythm.     Heart sounds: Normal heart sounds.  Pulmonary:     Effort: Pulmonary effort is normal. No respiratory distress.     Breath sounds: Normal breath sounds.  Abdominal:     General: There is no distension.     Palpations: Abdomen is soft.     Tenderness: There is no abdominal tenderness.   Musculoskeletal:        General: No deformity. Normal range of motion.     Cervical back: Normal range of motion and neck supple.    Skin:    General: Skin is warm and dry.     Comments: Mild edema to the left lower leg.  Scant weeping noted from the soft tissue on the medial aspect of the left lower leg.  See images below.  Scant erythema surrounding the distal left lower leg.  Distal left lower extremity is neurovascular intact.   Neurological:     General: No focal deficit present.     Mental Status: She is alert and oriented to person, place, and time.          (all labs ordered are listed, but only abnormal results are displayed) Labs Reviewed  BASIC METABOLIC PANEL WITH GFR  CBC WITH DIFFERENTIAL/PLATELET    EKG: None  Radiology: No results found.   Procedures   Medications Ordered in the ED - No data to display                                  Medical Decision Making Amount and/or Complexity of Data Reviewed Labs: ordered.  Risk Prescription drug management.    Medical Screen Complete  This patient presented to the ED with complaint of weeping from leg.  This complaint involves an extensive number of treatment options. The initial differential diagnosis includes, but is not limited to, infection, metabolic abnormality  This presentation is: Acute, Chronic, Self-Limited, Previously Undiagnosed, Uncertain Prognosis, Complicated, Systemic Symptoms, and Threat to Life/Bodily Function  Patient with superficial abrasion to the left lower leg.  This is weeping secondary to chronic edema.  Exam is on the whole without significant other acute abnormality.  Screening labs are without significant acute abnormality.  Dressing applied.  Out of concern for possible early infection antibiotics prescribed.  Importance of close follow-up stressed.  Strict return precautions given understood.  Additional history obtained: External records from outside sources obtained and reviewed including prior ED visits and prior Inpatient records.   Problem List / ED Course:  Left leg  edema   Reevaluation:  After the interventions noted above, I reevaluated the patient and found that they have: improved   Disposition:  After consideration of the diagnostic results and the patients response to treatment, I feel that the patent would benefit from close outpatient follow-up.       Final diagnoses:  Leg swelling    ED Discharge Orders          Ordered    doxycycline (VIBRAMYCIN) 100 MG capsule  2 times daily        10/14/23 1024  Burnette Carte, MD 10/14/23 1026

## 2023-10-14 NOTE — Discharge Instructions (Addendum)
Return for any problem.   Take all of antibiotic as prescribed.   

## 2023-12-22 ENCOUNTER — Encounter: Payer: Self-pay | Admitting: Podiatry

## 2023-12-22 ENCOUNTER — Ambulatory Visit: Admitting: Podiatry

## 2023-12-22 DIAGNOSIS — E1149 Type 2 diabetes mellitus with other diabetic neurological complication: Secondary | ICD-10-CM | POA: Diagnosis not present

## 2023-12-22 DIAGNOSIS — M79674 Pain in right toe(s): Secondary | ICD-10-CM | POA: Diagnosis not present

## 2023-12-22 DIAGNOSIS — B351 Tinea unguium: Secondary | ICD-10-CM

## 2023-12-22 DIAGNOSIS — M79675 Pain in left toe(s): Secondary | ICD-10-CM

## 2023-12-22 NOTE — Progress Notes (Signed)
This patient returns to my office for at risk foot care.  This patient requires this care by a professional since this patient will be at risk due to having diabetes type 2 and CKD.  This patient is unable to cut nails himself since the patient cannot reach his nails.These nails are painful walking and wearing shoes.  This patient presents for at risk foot care today. ? ?General Appearance  Alert, conversant and in no acute stress. ? ?Vascular  Dorsalis pedis and posterior tibial  pulses are weakly  palpable  bilaterally.  Capillary return is within normal limits  bilaterally. Temperature is within normal limits  bilaterally. ? ?Neurologic  Senn-Weinstein monofilament wire test dimiinshed   bilaterally. Muscle power within normal limits bilaterally. ? ?Nails Thick disfigured discolored nails with subungual debris  from hallux to fifth toes bilaterally. No evidence of bacterial infection or drainage bilaterally. ? ?Orthopedic  No limitations of motion  feet .  No crepitus or effusions noted.  No bony pathology or digital deformities noted.  HAV  B/L> ? ?Skin  normotropic skin with no porokeratosis noted bilaterally.  No signs of infections or ulcers noted.    ? ?Onychomycosis  Pain in right toes  Pain in left toes ? ?Consent was obtained for treatment procedures.   Mechanical debridement of nails 1-5  bilaterally performed with a nail nipper.  Filed with dremel without incident.  ? ? ?Return office visit  12 weeks                   Told patient to return for periodic foot care and evaluation due to potential at risk complications. ? ? ?Gardiner Barefoot DPM   ?

## 2024-02-07 ENCOUNTER — Emergency Department (HOSPITAL_COMMUNITY)
Admission: EM | Admit: 2024-02-07 | Discharge: 2024-02-07 | Disposition: A | Discharge status: Home / Self Care | Attending: Emergency Medicine | Admitting: Emergency Medicine

## 2024-02-07 ENCOUNTER — Emergency Department (HOSPITAL_COMMUNITY)

## 2024-02-07 ENCOUNTER — Encounter (HOSPITAL_COMMUNITY): Payer: Self-pay

## 2024-02-07 ENCOUNTER — Other Ambulatory Visit: Payer: Self-pay

## 2024-02-07 DIAGNOSIS — I10 Essential (primary) hypertension: Secondary | ICD-10-CM | POA: Diagnosis not present

## 2024-02-07 DIAGNOSIS — R531 Weakness: Secondary | ICD-10-CM | POA: Insufficient documentation

## 2024-02-07 DIAGNOSIS — R053 Chronic cough: Secondary | ICD-10-CM | POA: Insufficient documentation

## 2024-02-07 DIAGNOSIS — I2510 Atherosclerotic heart disease of native coronary artery without angina pectoris: Secondary | ICD-10-CM | POA: Insufficient documentation

## 2024-02-07 DIAGNOSIS — R42 Dizziness and giddiness: Secondary | ICD-10-CM | POA: Diagnosis present

## 2024-02-07 DIAGNOSIS — E119 Type 2 diabetes mellitus without complications: Secondary | ICD-10-CM | POA: Diagnosis not present

## 2024-02-07 LAB — COMPREHENSIVE METABOLIC PANEL WITH GFR
ALT: 18 U/L (ref 0–44)
AST: 23 U/L (ref 15–41)
Albumin: 4 g/dL (ref 3.5–5.0)
Alkaline Phosphatase: 89 U/L (ref 38–126)
Anion gap: 9 (ref 5–15)
BUN: 28 mg/dL — ABNORMAL HIGH (ref 8–23)
CO2: 26 mmol/L (ref 22–32)
Calcium: 10.9 mg/dL — ABNORMAL HIGH (ref 8.9–10.3)
Chloride: 101 mmol/L (ref 98–111)
Creatinine, Ser: 1.03 mg/dL — ABNORMAL HIGH (ref 0.44–1.00)
GFR, Estimated: 50 mL/min — ABNORMAL LOW (ref >60)
Glucose, Bld: 116 mg/dL — ABNORMAL HIGH (ref 70–99)
Potassium: 4.4 mmol/L (ref 3.5–5.1)
Sodium: 137 mmol/L (ref 135–145)
Total Bilirubin: 0.8 mg/dL (ref 0.0–1.2)
Total Protein: 7.4 g/dL (ref 6.5–8.1)

## 2024-02-07 LAB — CBC
HCT: 36.3 % (ref 36.0–46.0)
Hemoglobin: 11.1 g/dL — ABNORMAL LOW (ref 12.0–15.0)
MCH: 28.5 pg (ref 26.0–34.0)
MCHC: 30.6 g/dL (ref 30.0–36.0)
MCV: 93.3 fL (ref 80.0–100.0)
Platelets: 254 K/uL (ref 150–400)
RBC: 3.89 MIL/uL (ref 3.87–5.11)
RDW: 15 % (ref 11.5–15.5)
WBC: 9.3 K/uL (ref 4.0–10.5)
nRBC: 0 % (ref 0.0–0.2)

## 2024-02-07 LAB — RESP PANEL BY RT-PCR (RSV, FLU A&B, COVID)  RVPGX2
Influenza A by PCR: NEGATIVE
Influenza B by PCR: NEGATIVE
Resp Syncytial Virus by PCR: NEGATIVE
SARS Coronavirus 2 by RT PCR: NEGATIVE

## 2024-02-07 LAB — URINALYSIS, ROUTINE W REFLEX MICROSCOPIC
Bilirubin Urine: NEGATIVE
Glucose, UA: NEGATIVE mg/dL
Hgb urine dipstick: NEGATIVE
Ketones, ur: NEGATIVE mg/dL
Leukocytes,Ua: NEGATIVE
Nitrite: NEGATIVE
Protein, ur: NEGATIVE mg/dL
Specific Gravity, Urine: 1.006 (ref 1.005–1.030)
pH: 6 (ref 5.0–8.0)

## 2024-02-07 LAB — CBG MONITORING, ED: Glucose-Capillary: 117 mg/dL — ABNORMAL HIGH (ref 70–99)

## 2024-02-07 MED ORDER — SODIUM CHLORIDE 0.9 % IV BOLUS
1000.0000 mL | Freq: Once | INTRAVENOUS | Status: AC
  Administered 2024-02-07: 1000 mL via INTRAVENOUS

## 2024-02-07 MED ORDER — MECLIZINE HCL 12.5 MG PO TABS: 12.5000 mg | ORAL_TABLET | Freq: Three times a day (TID) | ORAL | 0 refills | Status: AC | PRN

## 2024-02-07 NOTE — ED Provider Notes (Signed)
 [place] EMERGENCY DEPARTMENT AT [facility] Provider Note   CSN: [id number] Arrival date & time: 02/07/24  1416     Patient presents with: Weakness   Jade [name] is a 89 y.o. female with a history of hypertension, type 2 diabetes, per lipidemia, coronary disease, nonrheumatic tricuspid valve regurg, presenting to the ED with complaint of lightheadedness.  Patient ports that been ongoing for several months or years but seems to have worsened in the past few days.  She says she feels lightheaded when standing up.  She does use a cane at home but is otherwise holding onto walls.  She denies vertigo to me.  She denies headache.  She reports she ate just a little bit today.  She denies any recent fevers, chills.  She reports she has a chronic mild cough, perhaps worsened recently.  She also reports she recently completed a course of amoxicillin  for UTI, earlier this week, and that those dysuria symptoms have resolved.  I reviewed her external records.  Most recent echocardiogram 1 year ago in October 2024 with an EF of 60%, grade 1 diastolic dysfunction, no significant or severe valvular disease noted  Patient reports she has chronic neck pain and wears lidocaine  patches for that.  Her neck seems to hurt more than normal.  She denies any falls or recent injuries  She had a CTA of her head and neck performed a year ago in May 2024 which showed mild atherosclerosis with no other significant neck irregularities.  She also had an MRI of the brain performed at the same time which showed no significant abnormalities.  {Add pertinent medical, surgical, social history, OB history to HPI:32947} HPI     Prior to Admission medications   Medication Sig Start Date End Date Taking? Authorizing Provider  aspirin  EC 81 MG tablet Take 81 mg by mouth as needed. Swallow whole.    [provider]  atorvastatin  (LIPITOR) 10 MG tablet Take 5 mg by mouth at bedtime.    [provider]  azelastine  (ASTELIN ) 0.1 % nasal spray Place 1 spray into both nostrils 2 (two) times daily. 02/25/22   [provider]  Cholecalciferol  (VITAMIN D -3 PO) Take 1 capsule by mouth at bedtime. 1000 units daily    [provider]  Coenzyme Q10 (COQ10 PO) Take 1 capsule by mouth daily.    [provider]  doxycycline  (VIBRAMYCIN ) 100 MG capsule Take 1 capsule (100 mg total) by mouth 2 (two) times daily. 10/14/23   Laurice Maude BROCKS, MD  furosemide  (LASIX ) 20 MG tablet Take 1 tablet (20 mg total) by mouth daily as needed. 02/03/22 11/12/22  Patwardhan, Newman PARAS, MD  levothyroxine  (SYNTHROID ) 75 MCG tablet Take 75 mcg by mouth daily.    [provider]  lidocaine  (LIDODERM ) 5 % Place 1 patch onto the skin daily. Remove & Discard patch within 12 hours or as directed by MD 09/21/22   Patt Alm Macho, MD  meclizine  (ANTIVERT ) 12.5 MG tablet Take 1 tablet (12.5 mg total) by mouth 3 (three) times daily as needed for dizziness. 09/21/22   Patt Alm Macho, MD  Melatonin CR (MELADOX) 3 MG TBCR Take 1 tablet by mouth as needed. Take 2 weeks off 1 week    [provider]  metFORMIN  (GLUCOPHAGE ) 500 MG tablet Take 1 tablet (500 mg total) by mouth daily with breakfast. 04/28/18   Ghimire, Donalda HERO, MD  Multiple Vitamins-Minerals (PRESERVISION AREDS 2) CAPS Take 1 capsule by mouth  2 (two) times daily.    [provider]  Omega-3 Fatty Acids (FISH OIL PO) Take 1 capsule by mouth daily.    [provider]  [facility] VERIO test strip 1 each by Other route daily. 01/22/23   [provider]  senna (SENOKOT) 8.6 MG tablet Take 1 tablet by mouth daily.    [provider]  timolol  (TIMOPTIC ) 0.5 % ophthalmic solution Place 1 drop into both eyes 2 (two) times daily.  08/26/14   [provider]    Allergies: Codeine, Miralax [polyethylene glycol], Tramadol, Ciprofibrate, Ciprofloxacin  hcl, and Pentazocine    Review of  Systems  Updated Vital Signs BP (!) 154/78 (BP Location: Right Arm)   Pulse 62   Temp 98.3 F (36.8 C) (Oral)   Resp 16   SpO2 100%   Physical Exam Constitutional:      General: She is not in acute distress. HENT:     Head: Normocephalic and atraumatic.  Eyes:     Conjunctiva/sclera: Conjunctivae normal.     Pupils: Pupils are equal, round, and reactive to light.  Cardiovascular:     Rate and Rhythm: Normal rate and regular rhythm.  Pulmonary:     Effort: Pulmonary effort is normal. No respiratory distress.  Abdominal:     General: There is no distension.     Tenderness: There is no abdominal tenderness.  Skin:    General: Skin is warm and dry.  Neurological:     General: No focal deficit present.     Mental Status: She is alert. Mental status is at baseline.  Psychiatric:        Mood and Affect: Mood normal.        Behavior: Behavior normal.     (all labs ordered are listed, but only abnormal results are displayed) Labs Reviewed  COMPREHENSIVE METABOLIC PANEL WITH GFR - Abnormal; Notable for the following components:      Result Value   Glucose, Bld 116 (*)    BUN 28 (*)    Creatinine, Ser 1.03 (*)    Calcium  10.9 (*)    GFR, Estimated 50 (*)    All other components within normal limits  CBC - Abnormal; Notable for the following components:   Hemoglobin 11.1 (*)    All other components within normal limits  CBG MONITORING, ED - Abnormal; Notable for the following components:   Glucose-Capillary 117 (*)    All other components within normal limits  RESP PANEL BY RT-PCR (RSV, FLU A&B, COVID)  RVPGX2  URINALYSIS, ROUTINE W REFLEX MICROSCOPIC    EKG: EKG Interpretation Date/Time:  Tuesday February 07 2024 14:23:51 EDT Ventricular Rate:  63 PR Interval:  216 QRS Duration:  91 QT Interval:  379 QTC Calculation: 388 R Axis:   5  Text Interpretation: Sinus or ectopic atrial rhythm Ventricular premature complex Confirmed by Cottie Cough [phone]) on  02/07/2024 4:32:16 PM  Radiology: No results found.  {Document cardiac monitor, telemetry assessment procedure when appropriate:32947} Procedures   Medications Ordered in the ED - No data to display    {Click here for ABCD2, HEART and other calculators REFRESH Note before signing:1}                              Medical Decision Making Amount and/or Complexity of Data Reviewed Labs: ordered. Radiology: ordered.   This patient presents to the ED with concern for lightheadedness, orthostatic. This involves an extensive  number of treatment options, and is a complaint that carries with it a high risk of complications and morbidity.  The differential diagnosis includes orthostatic hypotension and dehydration versus anemia versus arrhythmia versus infection versus other  Co-morbidities that complicate the patient evaluation: Advanced age, cardiovascular risk factors  Additional history obtained from patient's friend at bedside  External records from outside source obtained and reviewed including most recent echocardiogram from a year ago, MRI of the brain and CT angiogram from last year  I ordered and personally interpreted labs.  The pertinent results include:  ***  I ordered imaging studies including x-ray of the chest I independently visualized and interpreted imaging which showed *** I agree with the radiologist interpretation  The patient was maintained on a cardiac monitor.  I personally viewed and interpreted the cardiac monitored which showed an underlying rhythm of: Sinus rhythm with occasional PVCs  Per my interpretation the patient's ECG shows sinus rhythm with no acute ischemia  I ordered medication including ***  for ***  I have reviewed the patients home medicines and have made adjustments as needed  Test Considered: Doubt cerebellar stroke or vertebral injury or vascular injury of the head or neck.  I requested consultation with the ***,  and discussed lab and  imaging findings as well as pertinent plan - they recommend: ***  After the interventions noted above, I reevaluated the patient and found that they have: {resolved/improved/worsened:23923::improved}  Social Determinants of Health:***  Dispostion:  After consideration of the diagnostic results and the patients response to treatment, I feel that the patent would benefit from ***.   {Document critical care time when appropriate  Document review of labs and clinical decision tools ie CHADS2VASC2, etc  Document your independent review of radiology images and any outside records  Document your discussion with family members, caretakers and with consultants  Document social determinants of health affecting pt's care  Document your decision making why or why not admission, treatments were needed:32947:::1}   Final diagnoses:  None    ED Discharge Orders     None

## 2024-02-07 NOTE — ED Notes (Signed)
 Orthostatic Vital Signs: Supine Blood Pressure: 154/92       Heart Rate: 64 Sitting Blood Pressure: 154/74       Heart Rate: 64 Standing Blood Pressure: 144/75       Heart Rate: 62

## 2024-02-07 NOTE — ED Triage Notes (Signed)
 Pt reports with weakness and dizziness since today. Pt reports coughing every now and again.

## 2024-02-24 ENCOUNTER — Encounter (INDEPENDENT_AMBULATORY_CARE_PROVIDER_SITE_OTHER): Payer: Self-pay

## 2024-03-15 ENCOUNTER — Encounter: Payer: Self-pay | Admitting: Podiatry

## 2024-03-15 ENCOUNTER — Ambulatory Visit: Admitting: Podiatry

## 2024-03-15 DIAGNOSIS — M79674 Pain in right toe(s): Secondary | ICD-10-CM

## 2024-03-15 DIAGNOSIS — M79675 Pain in left toe(s): Secondary | ICD-10-CM | POA: Diagnosis not present

## 2024-03-15 DIAGNOSIS — E1149 Type 2 diabetes mellitus with other diabetic neurological complication: Secondary | ICD-10-CM | POA: Diagnosis not present

## 2024-03-15 DIAGNOSIS — B351 Tinea unguium: Secondary | ICD-10-CM

## 2024-03-15 NOTE — Progress Notes (Signed)
This patient returns to my office for at risk foot care.  This patient requires this care by a professional since this patient will be at risk due to having diabetes type 2 and CKD.  This patient is unable to cut nails himself since the patient cannot reach his nails.These nails are painful walking and wearing shoes.  This patient presents for at risk foot care today. ? ?General Appearance  Alert, conversant and in no acute stress. ? ?Vascular  Dorsalis pedis and posterior tibial  pulses are weakly  palpable  bilaterally.  Capillary return is within normal limits  bilaterally. Temperature is within normal limits  bilaterally. ? ?Neurologic  Senn-Weinstein monofilament wire test dimiinshed   bilaterally. Muscle power within normal limits bilaterally. ? ?Nails Thick disfigured discolored nails with subungual debris  from hallux to fifth toes bilaterally. No evidence of bacterial infection or drainage bilaterally. ? ?Orthopedic  No limitations of motion  feet .  No crepitus or effusions noted.  No bony pathology or digital deformities noted.  HAV  B/L> ? ?Skin  normotropic skin with no porokeratosis noted bilaterally.  No signs of infections or ulcers noted.    ? ?Onychomycosis  Pain in right toes  Pain in left toes ? ?Consent was obtained for treatment procedures.   Mechanical debridement of nails 1-5  bilaterally performed with a nail nipper.  Filed with dremel without incident.  ? ? ?Return office visit  12 weeks                   Told patient to return for periodic foot care and evaluation due to potential at risk complications. ? ? ?Gardiner Barefoot DPM   ?

## 2024-04-04 ENCOUNTER — Encounter (INDEPENDENT_AMBULATORY_CARE_PROVIDER_SITE_OTHER): Payer: Self-pay | Admitting: Otolaryngology

## 2024-04-04 ENCOUNTER — Ambulatory Visit (INDEPENDENT_AMBULATORY_CARE_PROVIDER_SITE_OTHER): Admitting: Otolaryngology

## 2024-04-04 VITALS — BP 138/82 | HR 71 | Temp 97.5°F | Ht <= 58 in | Wt 98.0 lb

## 2024-04-04 DIAGNOSIS — R490 Dysphonia: Secondary | ICD-10-CM | POA: Diagnosis not present

## 2024-04-04 DIAGNOSIS — R42 Dizziness and giddiness: Secondary | ICD-10-CM

## 2024-04-04 DIAGNOSIS — R2689 Other abnormalities of gait and mobility: Secondary | ICD-10-CM

## 2024-04-04 NOTE — Progress Notes (Signed)
 Reason for Consult: Dizziness Referring Physician: Dr. Clarice Rilla Jade [name] is an 89 y.o. female.  HPI: Here for imbalance problems.  She has had this for a long time.  Does not really remember how long.  She does not have spinning or vertigo.  She does not have any acute changes in hearing or new tinnitus.  She has been been given some exercises.  She wears a cane.  She has seen neurology.  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  Past Medical History:  Diagnosis Date   Allergic rhinitis    Bronchitis    Cataract    CKD (chronic kidney disease)    stage 3 kidney failure per pt   Diabetes mellitus    Diverticulosis 11/2010   GERD (gastroesophageal reflux disease)    Glaucoma    Hyperlipidemia    Hypertension    Hypothyroid    Internal hemorrhoids    Tubular adenoma polyp of rectum 02/2002   Type 2 diabetes mellitus (HCC)     Past Surgical History:  Procedure Laterality Date   APPENDECTOMY     BIOPSY  12/02/2021   Procedure: BIOPSY;  Surgeon: Federico Rosario BROCKS, MD;  Location: [facility] ENDOSCOPY;  Service: Gastroenterology;;   BREAST CYST EXCISION     CHOLECYSTECTOMY     COLONOSCOPY     DIVERTICULITIS  2004   SURGERY FOR DIVERTICULITIS PER PT.   FLEXIBLE SIGMOIDOSCOPY N/A 12/02/2021   Procedure: FLEXIBLE SIGMOIDOSCOPY;  Surgeon: Federico Rosario BROCKS, MD;  Location: [facility] ENDOSCOPY;  Service: Gastroenterology;  Laterality: N/A;   HERNIA REPAIR     w/ mesh   POLYPECTOMY     TONSILLECTOMY     TOTAL ABDOMINAL HYSTERECTOMY W/ BILATERAL SALPINGOOPHORECTOMY      Family History  Problem Relation Age of Onset   Lung cancer Father    Esophageal cancer Father    Heart disease Mother    Heart disease Brother    Diabetes Maternal Aunt    Diabetes Maternal Uncle    Diabetes Brother    Colon cancer Neg Hx    Rectal cancer Neg Hx    Stomach cancer Neg Hx     Social History:  reports that she has never smoked. She has never used  smokeless tobacco. She reports that she does not drink alcohol and does not use drugs.  Allergies:  Allergies  Allergen Reactions   Codeine Nausea And Vomiting    Pt saw flashing lights   Miralax [Polyethylene Glycol (Macrogol)] Swelling    Feet swelling   Tramadol Nausea And Vomiting and Other (See Comments)    Pt saw flashing lights   Ciprofibrate Swelling and Anxiety    Other reaction(s): muscle aches   Ciprofloxacin  Hcl Swelling and Anxiety    Muscle weakness.   Pentazocine Nausea And Vomiting, Swelling and Anxiety    Patient experiences Flashing lights     No results found for this or any previous visit (from the past 48 hours).  No results found.  ROS There were no vitals taken for this visit. Physical Exam Constitutional:      Appearance: Normal appearance.  HENT:     Head: Normocephalic and atraumatic.     Right Ear: Tympanic membrane is thickened and there is a monomer in the posterior inferior quadrant.  The middle ear looks aerated, ear canal and external ear normal.     Left Ear: Tympanic membrane is without lesions  and middle ear aerated, ear canal and external ear normal.     Nose: Nose without deviation of septum.  Turbinates with mild hypertrophy, No significant swelling or masses.     Oral cavity/oropharynx: Mucous membranes are moist. No lesions or masses    Larynx: High pitched voice. Mirror attempted without success    Eyes:     Extraocular Movements: Extraocular movements intact.     Conjunctiva/sclera: Conjunctivae normal.     Pupils: Pupils are equal, round, and reactive to light.  Cardiovascular:     Rate and Rhythm: Normal rate.  Pulmonary:     Effort: Pulmonary effort is normal.  Musculoskeletal:     Cervical back: Normal range of motion and neck supple. No rigidity.  Lymphadenopathy:     Cervical: No cervical adenopathy or masses.salivary glands without lesions. .     Salivary glands- no mass or swelling Neurological:     Mental Status: He  is alert. CN 2-12 intact. No nystagmus      Assessment/Plan: Imbalance-there is really not anything I can offer for this other than what has already been done with vestibular rehab and a use of a cane.  I do not think this is a ear problem.  Voice changes-this sounds like she has some bowing of the vocal cords and she can make another appointment with our laryngologist/general otolaryngologist here in the office to discuss a vocal cord injection and proceeding with scope at another visit.  She right now is no interested in the scope or workup for now.  Norleen Notice 04/04/2024, 11:45 AM

## 2024-05-16 ENCOUNTER — Other Ambulatory Visit: Payer: Self-pay

## 2024-05-16 ENCOUNTER — Ambulatory Visit: Admitting: Obstetrics and Gynecology

## 2024-05-16 ENCOUNTER — Emergency Department (HOSPITAL_COMMUNITY)

## 2024-05-16 ENCOUNTER — Emergency Department (HOSPITAL_COMMUNITY): Admission: EM | Admit: 2024-05-16 | Discharge: 2024-05-16 | Disposition: A | Discharge status: Home / Self Care

## 2024-05-16 DIAGNOSIS — K921 Melena: Secondary | ICD-10-CM | POA: Diagnosis not present

## 2024-05-16 DIAGNOSIS — R1032 Left lower quadrant pain: Secondary | ICD-10-CM | POA: Diagnosis present

## 2024-05-16 DIAGNOSIS — Z7982 Long term (current) use of aspirin: Secondary | ICD-10-CM | POA: Insufficient documentation

## 2024-05-16 LAB — CBC WITH DIFFERENTIAL/PLATELET
Abs Immature Granulocytes: 0.02 K/uL (ref 0.00–0.07)
Basophils Absolute: 0.1 K/uL (ref 0.0–0.1)
Basophils Relative: 1 %
Eosinophils Absolute: 0.5 K/uL (ref 0.0–0.5)
Eosinophils Relative: 5 %
HCT: 40.9 % (ref 36.0–46.0)
Hemoglobin: 12.4 g/dL (ref 12.0–15.0)
Immature Granulocytes: 0 %
Lymphocytes Relative: 23 %
Lymphs Abs: 2.3 K/uL (ref 0.7–4.0)
MCH: 29.3 pg (ref 26.0–34.0)
MCHC: 30.3 g/dL (ref 30.0–36.0)
MCV: 96.7 fL (ref 80.0–100.0)
Monocytes Absolute: 0.6 K/uL (ref 0.1–1.0)
Monocytes Relative: 6 %
Neutro Abs: 6.5 K/uL (ref 1.7–7.7)
Neutrophils Relative %: 65 %
Platelets: 287 K/uL (ref 150–400)
RBC: 4.23 MIL/uL (ref 3.87–5.11)
RDW: 14.6 % (ref 11.5–15.5)
WBC: 10 K/uL (ref 4.0–10.5)
nRBC: 0 % (ref 0.0–0.2)

## 2024-05-16 LAB — COMPREHENSIVE METABOLIC PANEL WITH GFR
ALT: 23 U/L (ref 0–44)
AST: 28 U/L (ref 15–41)
Albumin: 4.4 g/dL (ref 3.5–5.0)
Alkaline Phosphatase: 137 U/L — ABNORMAL HIGH (ref 38–126)
Anion gap: 10 (ref 5–15)
BUN: 38 mg/dL — ABNORMAL HIGH (ref 8–23)
CO2: 26 mmol/L (ref 22–32)
Calcium: 11.6 mg/dL — ABNORMAL HIGH (ref 8.9–10.3)
Chloride: 100 mmol/L (ref 98–111)
Creatinine, Ser: 1.14 mg/dL — ABNORMAL HIGH (ref 0.44–1.00)
GFR, Estimated: 44 mL/min — ABNORMAL LOW
Glucose, Bld: 98 mg/dL (ref 70–99)
Potassium: 5.4 mmol/L — ABNORMAL HIGH (ref 3.5–5.1)
Sodium: 136 mmol/L (ref 135–145)
Total Bilirubin: 0.7 mg/dL (ref 0.0–1.2)
Total Protein: 8.8 g/dL — ABNORMAL HIGH (ref 6.5–8.1)

## 2024-05-16 LAB — TYPE AND SCREEN
ABO/RH(D): A NEG
Antibody Screen: NEGATIVE

## 2024-05-16 MED ORDER — LACTATED RINGERS IV SOLN: INTRAVENOUS | Status: DC

## 2024-05-16 MED ORDER — FENTANYL CITRATE (PF) 50 MCG/ML IJ SOSY
12.5000 ug | PREFILLED_SYRINGE | Freq: Once | INTRAMUSCULAR | Status: AC
  Administered 2024-05-16: 12.5 ug via INTRAVENOUS
  Filled 2024-05-16: qty 1

## 2024-05-16 MED ORDER — IOHEXOL 300 MG/ML  SOLN
80.0000 mL | Freq: Once | INTRAMUSCULAR | Status: AC | PRN
  Administered 2024-05-16: 75 mL via INTRAVENOUS

## 2024-05-16 NOTE — Discharge Instructions (Signed)
 As we discussed, you can be discharged home. Please call your gastroenterologist to schedule an in-office appointment for close follow up. Return to the ED with any worsening symptoms - severe pain, any fever, any bloody stools.

## 2024-05-16 NOTE — ED Triage Notes (Addendum)
 Patient reports LLQ abdominal pain with black stools for 3 days. Patient is alert and oriented x 4. Airway patent, respirations even and unlabored. Skin normal, warm and dry. Denies fever/cough/weakness/SOB.

## 2024-05-16 NOTE — ED Provider Notes (Signed)
 " [place] [facility] AT [facility] Provider Note   CSN: [id number] Arrival date & time: 05/16/24  1338     Patient presents with: Melena and Abdominal Pain   Jade [name] is a 89 y.o. female.   Patient to ED with intermittent dark, black stools for the past 3 days and onset LLQ abdominal pain this morning. No fever, nausea, vomiting. She denies urinary symptoms. She reports history of diverticulitis requiring surgical intervention in the remote past.   The history is provided by the patient. No language interpreter was used.  Abdominal Pain      Prior to Admission medications  Medication Sig Start Date End Date Taking? Authorizing Provider  aspirin  EC 81 MG tablet Take 81 mg by mouth as needed. Swallow whole.    [provider]  atorvastatin  (LIPITOR) 10 MG tablet Take 5 mg by mouth at bedtime.    [provider]  azelastine  (ASTELIN ) 0.1 % nasal spray Place 1 spray into both nostrils 2 (two) times daily. 02/25/22   [provider]  Cholecalciferol  (VITAMIN D -3 PO) Take 1 capsule by mouth at bedtime. 1000 units daily    [provider]  Coenzyme Q10 (COQ10 PO) Take 1 capsule by mouth daily.    [provider]  doxycycline  (VIBRAMYCIN ) 100 MG capsule Take 1 capsule (100 mg total) by mouth 2 (two) times daily. 10/14/23   Laurice Maude BROCKS, MD  furosemide  (LASIX ) 20 MG tablet Take 1 tablet (20 mg total) by mouth daily as needed. 02/03/22 11/12/22  Patwardhan, Newman PARAS, MD  levothyroxine  (SYNTHROID ) 75 MCG tablet Take 75 mcg by mouth daily.    [provider]  lidocaine  (LIDODERM ) 5 % Place 1 patch onto the skin daily. Remove & Discard patch within 12 hours or as directed by MD 09/21/22   Patt Alm Macho, MD  meclizine  (ANTIVERT ) 12.5 MG tablet Take 1 tablet (12.5 mg total) by mouth 3 (three) times daily as needed for dizziness. 09/21/22   Patt Alm Macho, MD  meclizine  (ANTIVERT ) 12.5 MG tablet Take 1  tablet (12.5 mg total) by mouth 3 (three) times daily as needed for up to 21 doses for dizziness. 02/07/24   Cottie Donnice PARAS, MD  Melatonin CR (MELADOX) 3 MG TBCR Take 1 tablet by mouth as needed. Take 2 weeks off 1 week    [provider]  metFORMIN  (GLUCOPHAGE ) 500 MG tablet Take 1 tablet (500 mg total) by mouth daily with breakfast. 04/28/18   Ghimire, Donalda HERO, MD  Multiple Vitamins-Minerals (PRESERVISION AREDS 2) CAPS Take 1 capsule by mouth 2 (two) times daily.    [provider]  Omega-3 Fatty Acids (FISH OIL PO) Take 1 capsule by mouth daily.    [provider]  [facility] VERIO test strip 1 each by Other route daily. 01/22/23   [provider]  senna (SENOKOT) 8.6 MG tablet Take 1 tablet by mouth daily.    [provider]  timolol  (TIMOPTIC ) 0.5 % ophthalmic solution Place 1 drop into both eyes 2 (two) times daily.  08/26/14   [provider]    Allergies: Codeine, Miralax [polyethylene glycol (macrogol)], Tramadol, Ciprofibrate, Ciprofloxacin  hcl, and Pentazocine    Review of Systems  Gastrointestinal:  Positive for abdominal pain.    Updated Vital Signs BP (!) 147/65   Pulse (!) 106   Temp 98.1 F (36.7 C) (Oral)   Resp 16   SpO2 97%   Physical Exam Vitals and nursing note  reviewed.  Constitutional:      General: She is not in acute distress.    Appearance: She is well-developed. She is not ill-appearing or toxic-appearing.  HENT:     Head: Normocephalic.  Cardiovascular:     Rate and Rhythm: Normal rate and regular rhythm.     Heart sounds: No murmur heard. Pulmonary:     Effort: Pulmonary effort is normal.     Breath sounds: Normal breath sounds. No wheezing, rhonchi or rales.  Abdominal:     General: Bowel sounds are normal. There is no distension.     Palpations: Abdomen is soft.     Tenderness: There is abdominal tenderness in the left lower quadrant. There is no guarding or rebound.  Musculoskeletal:         General: Normal range of motion.     Cervical back: Normal range of motion and neck supple.  Skin:    General: Skin is warm and dry.  Neurological:     General: No focal deficit present.     Mental Status: She is alert and oriented to person, place, and time.     (all labs ordered are listed, but only abnormal results are displayed) Labs Reviewed  COMPREHENSIVE METABOLIC PANEL WITH GFR - Abnormal; Notable for the following components:      Result Value   Potassium 5.4 (*)    BUN 38 (*)    Creatinine, Ser 1.14 (*)    Calcium  11.6 (*)    Total Protein 8.8 (*)    Alkaline Phosphatase 137 (*)    GFR, Estimated 44 (*)    All other components within normal limits  CBC WITH DIFFERENTIAL/PLATELET  POC OCCULT BLOOD, ED  TYPE AND SCREEN  ABO/RH    EKG: None  Radiology: CT ABDOMEN PELVIS W CONTRAST Result Date: 05/16/2024 EXAM: CT ABDOMEN AND PELVIS WITH CONTRAST 05/16/2024 06:03:52 PM TECHNIQUE: CT of the abdomen and pelvis was performed with the administration of 75 mL of iohexol  (OMNIPAQUE ) 300 MG/ML solution. Multiplanar reformatted images are provided for review. Automated exposure control, iterative reconstruction, and/or weight-based adjustment of the mA/kV was utilized to reduce the radiation dose to as low as reasonably achievable. COMPARISON: 11/30/2021 CLINICAL HISTORY: Abdominal pain, acute, nonlocalized. FINDINGS: LOWER CHEST: Fibrolinear scarring and subsegmental atelectasis in the lung bases. LIVER: Diffuse intrahepatic biliary ductal dilation, likely related to the prior cholecystectomy. GALLBLADDER AND BILE DUCTS: Prior cholecystectomy. No cholelithiasis. SPLEEN: No acute abnormality. PANCREAS: No acute abnormality. ADRENAL GLANDS: No acute abnormality. KIDNEYS, URETERS AND BLADDER: Similar appearance of bilateral renal cysts. No stones in the kidneys or ureters. No hydronephrosis. No perinephric or periureteral stranding. The urinary bladder was distended without focal  abnormality. GI AND BOWEL: Stomach demonstrates no acute abnormality. Moderate volume fecal loading throughout the entire colon. Diffuse sigmoid diverticulosis. Hyperdense material within the sigmoid colon, possibly hyperdense stool. No changes of acute diverticulitis. There is no bowel obstruction. Appendix was not visualized. No right lower quadrant or pericecal inflammatory changes to suggest acute appendicitis. PERITONEUM AND RETROPERITONEUM: No ascites. No free air. VASCULATURE: Aorta is normal in caliber. Diffuse aortoiliac atherosclerosis. Diffuse multivessel coronary atherosclerosis. LYMPH NODES: No lymphadenopathy. REPRODUCTIVE ORGANS: Hysterectomy. BONES AND SOFT TISSUES: Advanced multilevel facet arthropathy with multilevel degenerative disc disease. Diffuse osteopenia. Mild grade 1 anterolisthesis of L4 on L5 and L5 on S1. No focal soft tissue abnormality. IMPRESSION: 1. Sigmoid diverticulosis. No changes of acute diverticulitis. Hyperdense material within the sigmoid colon, may represent layering hyperdense stool. If there is concern for  active GI bleeding, a repeat CTA of the abdomen and pelvis versus nuclear medicine GI bleed study recommended. 2. Moderate volume fecal loading throughout the entire colon , as can be seen in constipation. Electronically signed by: Rogelia Myers MD 05/16/2024 06:36 PM EST RP Workstation: [id number]     Procedures   Medications Ordered in the ED  lactated ringers  infusion ( Intravenous New Bag/Given 05/16/24 1634)  fentaNYL  (SUBLIMAZE ) injection 12.5 mcg (12.5 mcg Intravenous Given 05/16/24 1630)  iohexol  (OMNIPAQUE ) 300 MG/ML solution 80 mL (75 mLs Intravenous Contrast Given 05/16/24 1748)    Clinical Course as of 05/16/24 1940  Wed May 16, 2024  1547 Patient with LLQ abdominal pain, dark stools, no fever, h/o diverticulitis. Pain addressed. CT, labs pending. [SU]  1930 Labs reassuring with no drop in hgb, no leukocytosis. Mildly elevated potassium. Guaiac  collected and processed by me is negative. CT showing diverticulosis without acute inflammation.   On re--examination, her pain is completely resolved. The patient has been seen by dr. Simon and her condition/presentation reviewed. She is felt stable for discharge home. She will call her gastroenterologist Oma) for close follow up. Discussed strict return precautions. Patient is comfortable and felt appropriate for discharge home.  [SU]    Clinical Course User Index [SU] Odell Balls, PA-C                                 Medical Decision Making Risk Prescription drug management.        Final diagnoses:  Left lower quadrant abdominal pain    ED Discharge Orders     None          Odell Balls, DEVONNA 05/16/24 1940    Simon Lavonia SAILOR, MD 05/16/24 2150  "

## 2024-06-12 ENCOUNTER — Ambulatory Visit: Admitting: Obstetrics and Gynecology

## 2024-06-14 ENCOUNTER — Ambulatory Visit: Admitting: Gastroenterology

## 2024-06-18 ENCOUNTER — Ambulatory Visit: Admitting: Podiatry

## 2024-08-09 ENCOUNTER — Ambulatory Visit: Admitting: Cardiology
